# Patient Record
Sex: Female | Born: 1935 | Race: White | Hispanic: No | Smoking: Never smoker
Health system: Southern US, Community
[De-identification: ages and names within clinical notes are randomized; demographics above are authoritative.]

## PROBLEM LIST (undated history)

## (undated) DIAGNOSIS — I480 Paroxysmal atrial fibrillation: Secondary | ICD-10-CM

## (undated) DIAGNOSIS — R55 Syncope and collapse: Secondary | ICD-10-CM

## (undated) DIAGNOSIS — I2510 Atherosclerotic heart disease of native coronary artery without angina pectoris: Secondary | ICD-10-CM

## (undated) DIAGNOSIS — I5032 Chronic diastolic (congestive) heart failure: Secondary | ICD-10-CM

## (undated) DIAGNOSIS — N182 Chronic kidney disease, stage 2 (mild): Secondary | ICD-10-CM

## (undated) DIAGNOSIS — E669 Obesity, unspecified: Secondary | ICD-10-CM

## (undated) DIAGNOSIS — I10 Essential (primary) hypertension: Secondary | ICD-10-CM

## (undated) DIAGNOSIS — E119 Type 2 diabetes mellitus without complications: Secondary | ICD-10-CM

## (undated) DIAGNOSIS — M1990 Unspecified osteoarthritis, unspecified site: Secondary | ICD-10-CM

## (undated) DIAGNOSIS — R001 Bradycardia, unspecified: Secondary | ICD-10-CM

## (undated) HISTORY — PX: PACEMAKER INSERTION: SHX728

## (undated) HISTORY — DX: Type 2 diabetes mellitus without complications: E11.9

## (undated) HISTORY — PX: CATARACT EXTRACTION: SUR2

## (undated) HISTORY — PX: TOTAL ABDOMINAL HYSTERECTOMY: SHX209

## (undated) HISTORY — DX: Chronic kidney disease, stage 2 (mild): N18.2

## (undated) HISTORY — DX: Paroxysmal atrial fibrillation: I48.0

## (undated) HISTORY — DX: Essential (primary) hypertension: I10

## (undated) HISTORY — DX: Obesity, unspecified: E66.9

## (undated) HISTORY — DX: Unspecified osteoarthritis, unspecified site: M19.90

## (undated) HISTORY — DX: Atherosclerotic heart disease of native coronary artery without angina pectoris: I25.10

## (undated) HISTORY — DX: Chronic diastolic (congestive) heart failure: I50.32

---

## 2009-01-28 ENCOUNTER — Encounter: Payer: Self-pay | Admitting: Cardiovascular Disease

## 2009-01-30 ENCOUNTER — Ambulatory Visit: Payer: Self-pay | Admitting: Cardiology

## 2009-01-31 ENCOUNTER — Encounter: Payer: Self-pay | Admitting: Cardiology

## 2009-02-03 ENCOUNTER — Encounter: Payer: Self-pay | Admitting: Cardiology

## 2009-02-04 ENCOUNTER — Ambulatory Visit: Payer: Self-pay | Admitting: Cardiovascular Disease

## 2009-02-04 ENCOUNTER — Encounter: Payer: Self-pay | Admitting: Cardiology

## 2009-02-04 ENCOUNTER — Inpatient Hospital Stay (HOSPITAL_COMMUNITY): Admission: AD | Admit: 2009-02-04 | Discharge: 2009-02-07 | Payer: Self-pay | Admitting: Cardiology

## 2009-02-05 DIAGNOSIS — I2510 Atherosclerotic heart disease of native coronary artery without angina pectoris: Secondary | ICD-10-CM

## 2009-02-05 HISTORY — DX: Atherosclerotic heart disease of native coronary artery without angina pectoris: I25.10

## 2009-02-10 ENCOUNTER — Encounter: Payer: Self-pay | Admitting: Cardiology

## 2009-02-13 ENCOUNTER — Encounter: Payer: Self-pay | Admitting: Cardiology

## 2009-02-15 ENCOUNTER — Encounter: Payer: Self-pay | Admitting: Cardiovascular Disease

## 2009-02-15 ENCOUNTER — Encounter: Payer: Self-pay | Admitting: Cardiology

## 2009-02-15 ENCOUNTER — Ambulatory Visit: Payer: Self-pay | Admitting: Cardiology

## 2009-02-16 ENCOUNTER — Encounter: Payer: Self-pay | Admitting: Cardiology

## 2009-02-17 ENCOUNTER — Inpatient Hospital Stay (HOSPITAL_COMMUNITY): Admission: AD | Admit: 2009-02-17 | Discharge: 2009-02-19 | Payer: Self-pay | Admitting: Cardiology

## 2009-02-17 ENCOUNTER — Encounter: Payer: Self-pay | Admitting: Cardiology

## 2009-02-19 ENCOUNTER — Encounter: Payer: Self-pay | Admitting: Cardiovascular Disease

## 2009-02-19 DIAGNOSIS — I2589 Other forms of chronic ischemic heart disease: Secondary | ICD-10-CM | POA: Insufficient documentation

## 2009-02-19 DIAGNOSIS — M1990 Unspecified osteoarthritis, unspecified site: Secondary | ICD-10-CM | POA: Insufficient documentation

## 2009-02-19 DIAGNOSIS — I509 Heart failure, unspecified: Secondary | ICD-10-CM | POA: Insufficient documentation

## 2009-02-19 DIAGNOSIS — E669 Obesity, unspecified: Secondary | ICD-10-CM | POA: Insufficient documentation

## 2009-02-19 DIAGNOSIS — I5021 Acute systolic (congestive) heart failure: Secondary | ICD-10-CM | POA: Insufficient documentation

## 2009-02-19 DIAGNOSIS — E119 Type 2 diabetes mellitus without complications: Secondary | ICD-10-CM | POA: Insufficient documentation

## 2009-02-19 DIAGNOSIS — I214 Non-ST elevation (NSTEMI) myocardial infarction: Secondary | ICD-10-CM | POA: Insufficient documentation

## 2009-02-19 DIAGNOSIS — I10 Essential (primary) hypertension: Secondary | ICD-10-CM

## 2009-02-19 DIAGNOSIS — R079 Chest pain, unspecified: Secondary | ICD-10-CM

## 2009-03-10 ENCOUNTER — Encounter: Payer: Self-pay | Admitting: Cardiology

## 2009-03-12 ENCOUNTER — Ambulatory Visit: Payer: Self-pay | Admitting: Cardiology

## 2009-03-12 DIAGNOSIS — I498 Other specified cardiac arrhythmias: Secondary | ICD-10-CM

## 2009-03-12 DIAGNOSIS — N182 Chronic kidney disease, stage 2 (mild): Secondary | ICD-10-CM | POA: Insufficient documentation

## 2009-03-16 ENCOUNTER — Telehealth: Payer: Self-pay | Admitting: Cardiology

## 2009-03-19 ENCOUNTER — Telehealth (INDEPENDENT_AMBULATORY_CARE_PROVIDER_SITE_OTHER): Payer: Self-pay | Admitting: *Deleted

## 2009-03-24 ENCOUNTER — Encounter: Payer: Self-pay | Admitting: Cardiology

## 2009-06-05 ENCOUNTER — Encounter: Payer: Self-pay | Admitting: Cardiology

## 2009-06-06 ENCOUNTER — Ambulatory Visit: Payer: Self-pay | Admitting: Cardiology

## 2009-06-19 ENCOUNTER — Encounter: Payer: Self-pay | Admitting: Cardiology

## 2009-06-19 ENCOUNTER — Ambulatory Visit: Payer: Self-pay | Admitting: Cardiology

## 2009-07-17 ENCOUNTER — Encounter: Payer: Self-pay | Admitting: Cardiology

## 2009-09-01 ENCOUNTER — Encounter: Payer: Self-pay | Admitting: Cardiology

## 2009-09-01 DIAGNOSIS — R0602 Shortness of breath: Secondary | ICD-10-CM | POA: Insufficient documentation

## 2009-09-17 ENCOUNTER — Ambulatory Visit: Payer: Self-pay | Admitting: Cardiology

## 2009-09-17 ENCOUNTER — Encounter: Payer: Self-pay | Admitting: Cardiology

## 2009-09-25 ENCOUNTER — Encounter (INDEPENDENT_AMBULATORY_CARE_PROVIDER_SITE_OTHER): Payer: Self-pay | Admitting: *Deleted

## 2009-10-10 ENCOUNTER — Ambulatory Visit: Payer: Self-pay | Admitting: Cardiology

## 2009-10-10 ENCOUNTER — Encounter: Payer: Self-pay | Admitting: Cardiology

## 2010-04-22 ENCOUNTER — Ambulatory Visit: Payer: Self-pay | Admitting: Cardiology

## 2010-04-22 DIAGNOSIS — E876 Hypokalemia: Secondary | ICD-10-CM | POA: Insufficient documentation

## 2010-04-22 DIAGNOSIS — I2510 Atherosclerotic heart disease of native coronary artery without angina pectoris: Secondary | ICD-10-CM

## 2010-04-23 ENCOUNTER — Encounter: Payer: Self-pay | Admitting: Cardiology

## 2010-05-26 NOTE — Miscellaneous (Signed)
Summary: Orders Update - BMET,HgA1c,Calcium  Clinical Lists Changes  Orders: Added new Test order of T-Basic Metabolic Panel [id number]) - Signed Added new Test order of T- Hemoglobin A1C ([id number]) - Signed Added new Test order of T-Calcium  ([id number]) - Signed

## 2010-05-26 NOTE — Consult Note (Signed)
Summary: CARDIOLOGY CONSULT/ [facility]  CARDIOLOGY CONSULT/ [facility]   Imported By: Zachary George 10/10/2009 09:52:56  _____________________________________________________________________  External Attachment:    Type:   Image     Comment:   External Document

## 2010-05-26 NOTE — Letter (Signed)
Summary: Engineer, materials at [facility]  [address]   [place], [place] [address]   Phone: [phone]  Fax: [phone]        September 25, 2009 MRN: [id number]   Brianna Chandler [address] [place], [place]  [address]   Dear Brianna Chandler,  Your test ordered by Selena Batten has been reviewed by your physician (or physician assistant) and was found to be normal or stable. Your physician (or physician assistant) felt no changes were needed at this time.  __X__ Echocardiogram  ____ Cardiac Stress Test  ____ Lab Work  ____ Peripheral vascular study of arms, legs or neck  ____ CT scan or X-ray  ____ Lung or Breathing test  ____ Other:   Thank you.   Hoover Brunette, LPN    Duane Boston, M.D., F.A.C.C. Thressa Sheller, M.D., F.A.C.C. Oneal Grout, M.D., F.A.C.C. Cheree Ditto, M.D., F.A.C.C. Daiva Nakayama, M.D., F.A.C.C. Kenney Houseman, M.D., F.A.C.C. Jeanne Ivan, PA-C

## 2010-05-26 NOTE — Letter (Signed)
Summary: [facility] D/C DR. Wende Crease  [facility] D/C DR. Wende Crease   Imported By: Zachary George 10/10/2009 09:53:21  _____________________________________________________________________  External Attachment:    Type:   Image     Comment:   External Document

## 2010-05-26 NOTE — Assessment & Plan Note (Signed)
Summary: 3 MONTH FU   Visit Type:  Follow-up Primary Provider:  Margo Common   History of Present Illness: the patient is an 77 year old female with an ischemic cardiomyopathy.  Prior ejection fraction was 30%.  She had a recent echocardiogram her ejection fraction has improved to 55 to 60%.  She has a stent placed to the LAD.  His been doing quite well.  She reports occasional chest pains that appear to be atypical.  She also does not use nitroglycerin.  She stated that her legs hurt quite a bit.  She denies however any claudication she reports more a sensation of stiffness.  She denies orthopnea PND  Preventive Screening-Counseling & Management  Alcohol-Tobacco     Smoking Status: never  Current Medications (verified): 1)  Tylenol 325 Mg Tabs (Acetaminophen) .... Take One By Mouth Every 4 Hours As Needed 2)  Aspirin 325 Mg Tabs (Aspirin) .... Take 1 Tablet By Mouth Once A Day 3)  Plavix 75 Mg Tabs (Clopidogrel Bisulfate) .... Take 1 Tablet By Mouth Once A Day 4)  Furosemide 20 Mg Tabs (Furosemide) .... Take 1 Tablet By Mouth Once A Day 5)  Isosorbide Mononitrate Cr 60 Mg Xr24h-Tab (Isosorbide Mononitrate) .... Take 1&1/2 Tablet By Mouth Once A Day 6)  Potassium Chloride Crys Cr 10 Meq Cr-Tabs (Potassium Chloride Crys Cr) .... Take 1 Tablet By Mouth Once A Day 7)  Lisinopril 20 Mg Tabs (Lisinopril) .... Take 1 Tablet By Mouth Once A Day 8)  Glucosamine Sulfate 750 Mg Tabs (Glucosamine Sulfate) .... Take 1 Tablet By Mouth Two Times A Day 9)  Meclizine Hcl 25 Mg Tabs (Meclizine Hcl) .... Take 1-2  Tablet By Mouth Three Times A Day As Needed 10)  Metformin Hcl 500 Mg Xr24h-Tab (Metformin Hcl) .... Take 2 Tablet By Mouth Once A Day 11)  Tramadol Hcl 50 Mg Tabs (Tramadol Hcl) .... Take One By Mouth Every Four Hours As Needed 12)  One Daily Complete  Tabs (Multiple Vitamins-Minerals) .... Take 1 Tablet By Mouth Once A Day 13)  Coreg 6.25 Mg Tabs (Carvedilol) .... Take 1 Tablet By Mouth Two Times A  Day (Place On File) 14)  Nitrostat 0.4 Mg Subl (Nitroglycerin) .Marland Kitchen.. 1 Tablet Under Tongue At Onset of Chest Pain; You May Repeat Every 5 Minutes For Up To 3 Doses. 15)  Losartan Potassium 50 Mg Tabs (Losartan Potassium) .... Take 1 Tablet By Mouth Once A Day 16)  Spironolactone 25 Mg Tabs (Spironolactone) .... Take 1 Tablet By Mouth Two Times A Day  Allergies (verified): No Known Drug Allergies  Comments:  Nurse/Medical Assistant: The patient's medication bottles and allergies were reviewed with the patient and were updated in the Medication and Allergy Lists.  Past History:  Past Medical History: Last updated: 02/19/2009 CHEST PAIN (ICD-786.50) ACUT MI SUBENDOCARDIAL INFARCT INIT EPIS CARE (ICD-410.71) ACUTE SYSTOLIC HEART FAILURE (ICD-428.21) CHF (ICD-428.0) OTHER SPEC FORMS CHRONIC ISCHEMIC HEART DISEASE (ICD-414.8) DM (ICD-250.00) HYPERTENSION (ICD-401.9) OSTEOARTHROS UNSPEC WHETHER GEN/LOC UNSPEC SITE (ICD-715.90) OBESITY (ICD-278.00)  Past Surgical History: Last updated: 02/19/2009 Abdominal Hysterectomy-Total Cataract Extraction  Past Cardiac History:  [facility] Hospitalizations: FINAL DIAGNOSES: 1. Admitted with weakness secondary to significant bradycardia.     a.     Heart rate in the 40s.     b.     Labetalol 200 mg b.i.d. STOPPED.     c.     The patient's heart rate has been 70 beats per minute or      above for the last 48  hours here at [facility].     d.     Coreg 3.125 mg b.i.d. started February 17, 2009 in the      evening. 2. Elevated BUN with a creatinine of 1.53, a BUN of 62, both     lisinopril and metformin have been placed on hold.  Her discharge     basic metabolic panel shows a creatinine of 1.28.  The metformin     has been restarted and the lisinopril was continued on hold and the     patient has been asked to hold it until she sees Dr. Andee Lineman. 3. Recent admission for NSTEMI/with acute-on-chronic congestive heart     failure, February 04, 2009.     a.     Echocardiogram that admission, ejection fraction 20-25%.     b.     Left heart catheterization February 05, 2009 ejection      fraction 30%, a 90% proximal left anterior descending (coronary      artery) stenosis was intervened with a Xience drug-eluting stent.   SECONDARY DIAGNOSES: 1. New York Heart Association class IIB chronic systolic congestive     heart failure. 2. Diabetes. 3. Hypertension. 4. Osteoarthritis.   Once again symptomatic bradycardia has resolved with discontinuing the patient's labetalol 200 mg twice daily dose. 02/15/2009 - 03/22/2009:  Unstable Angina (bradycardia)   Review of Systems       The patient complains of chest pain.  The patient denies fatigue, malaise, fever, weight gain/loss, vision loss, decreased hearing, hoarseness, palpitations, shortness of breath, prolonged cough, wheezing, sleep apnea, coughing up blood, abdominal pain, blood in stool, nausea, vomiting, diarrhea, heartburn, incontinence, blood in urine, muscle weakness, joint pain, leg swelling, rash, skin lesions, headache, fainting, dizziness, depression, anxiety, enlarged lymph nodes, easy bruising or bleeding, and environmental allergies.    Vital Signs:  Patient profile:   77 year old female Height:      63 inches Weight:      217 pounds Pulse rate:   67 / minute BP sitting:   142 / 70  (left arm) Cuff size:   large  Vitals Entered By: Carlye Grippe (October 10, 2009 10:50 AM)  Physical Exam  Additional Exam:  General: Well-developed, well-nourished in no distress head: Normocephalic and atraumatic eyes PERRLA/EOMI intact, conjunctiva and lids normal nose: No deformity or lesions mouth normal dentition, normal posterior pharynx neck: Supple, no JVD.  No masses, thyromegaly or abnormal cervical nodes lungs: Normal breath sounds bilaterally without wheezing.  Normal percussion heart: regular rate and rhythm with normal S1 and S2, no S3 or S4.  PMI is normal.  No  pathological murmurs abdomen: Normal bowel sounds, abdomen is soft and nontender without masses, organomegaly or hernias noted.  No hepatosplenomegaly musculoskeletal: Back normal, normal gait muscle strength and tone normal pulsus: Pulse is normal in all 4 extremities Extremities: No peripheral pitting edema neurologic: Alert and oriented x 3 skin: Intact without lesions or rashes cervical nodes: No significant adenopathy psychologic: Normal affect    Impression & Recommendations:  Problem # 1:  CHEST PAIN (ICD-786.50) the patient's chest pain is atypical.  I do not think she needs an ischemia workup particularly in light of her improved ejection fraction.  She can use p.r.n. nitroglycerin. Her updated medication list for this problem includes:    Aspirin 325 Mg Tabs (Aspirin) .Marland Kitchen... Take 1 tablet by mouth once a day    Plavix 75 Mg Tabs (Clopidogrel bisulfate) .Marland Kitchen... Take 1 tablet by  mouth once a day    Isosorbide Mononitrate Cr 60 Mg Xr24h-tab (Isosorbide mononitrate) .Marland Kitchen... Take 1&1/2 tablet by mouth once a day    Lisinopril 20 Mg Tabs (Lisinopril) .Marland Kitchen... Take 1 tablet by mouth once a day    Coreg 6.25 Mg Tabs (Carvedilol) .Marland Kitchen... Take 1 tablet by mouth two times a day (place on file)    Nitrostat 0.4 Mg Subl (Nitroglycerin) .Marland Kitchen... 1 tablet under tongue at onset of chest pain; you may repeat every 5 minutes for up to 3 doses.  Problem # 2:  BRADYCARDIA (ICD-427.89) bradycardia appears to have resolved.  The patient's heart rate was in normal limits. Her updated medication list for this problem includes:    Aspirin 325 Mg Tabs (Aspirin) .Marland Kitchen... Take 1 tablet by mouth once a day    Plavix 75 Mg Tabs (Clopidogrel bisulfate) .Marland Kitchen... Take 1 tablet by mouth once a day    Isosorbide Mononitrate Cr 60 Mg Xr24h-tab (Isosorbide mononitrate) .Marland Kitchen... Take 1&1/2 tablet by mouth once a day    Lisinopril 20 Mg Tabs (Lisinopril) .Marland Kitchen... Take 1 tablet by mouth once a day    Coreg 6.25 Mg Tabs (Carvedilol) .Marland Kitchen...  Take 1 tablet by mouth two times a day (place on file)    Nitrostat 0.4 Mg Subl (Nitroglycerin) .Marland Kitchen... 1 tablet under tongue at onset of chest pain; you may repeat every 5 minutes for up to 3 doses.  Problem # 3:  CHF (ICD-428.0) the patient has had no symptoms of heart failure.  Previously she had LV dysfunction but this has resolved. Her updated medication list for this problem includes:    Aspirin 325 Mg Tabs (Aspirin) .Marland Kitchen... Take 1 tablet by mouth once a day    Plavix 75 Mg Tabs (Clopidogrel bisulfate) .Marland Kitchen... Take 1 tablet by mouth once a day    Furosemide 20 Mg Tabs (Furosemide) .Marland Kitchen... Take 1 tablet by mouth once a day    Isosorbide Mononitrate Cr 60 Mg Xr24h-tab (Isosorbide mononitrate) .Marland Kitchen... Take 1&1/2 tablet by mouth once a day    Lisinopril 20 Mg Tabs (Lisinopril) .Marland Kitchen... Take 1 tablet by mouth once a day    Coreg 6.25 Mg Tabs (Carvedilol) .Marland Kitchen... Take 1 tablet by mouth two times a day (place on file)    Nitrostat 0.4 Mg Subl (Nitroglycerin) .Marland Kitchen... 1 tablet under tongue at onset of chest pain; you may repeat every 5 minutes for up to 3 doses.    Losartan Potassium 50 Mg Tabs (Losartan potassium) .Marland Kitchen... Take 1 tablet by mouth once a day    Spironolactone 25 Mg Tabs (Spironolactone) .Marland Kitchen... Take 1 tablet by mouth two times a day  Patient Instructions: 1)  Your physician wants you to follow-up in: 6 months. You will receive a reminder letter in the mail one-two months in advance. If you don't receive a letter, please call our office to schedule the follow-up appointment. 2)  Your physician recommends that you continue on your current medications as directed. Please refer to the Current Medication list given to you today. Prescriptions: NITROSTAT 0.4 MG SUBL (NITROGLYCERIN) 1 tablet under tongue at onset of chest pain; you may repeat every 5 minutes for up to 3 doses.  #25 x 3   Entered by:   Cyril Loosen, RN, BSN   Authorized by:   Lewayne Bunting, MD, [facility]   Signed by:   Cyril Loosen, RN, BSN on  10/10/2009   Method used:   Electronically to        [facility]* (  retail)       [address]       [place], [place]  [address]       Ph: [phone]       Fax: [phone]   RxID:   [id number]

## 2010-05-26 NOTE — Assessment & Plan Note (Signed)
Summary: f11m --agh   Visit Type:  Follow-up Primary Provider:  Margo Common  CC:  follow-up visit.  History of Present Illness: the patient is an 77 year old female with a history of ischemic cardiomyopathy.  Her ejection fraction is approximately 30%.  Status-post stent placement to the LAD.  She expressed an episode of chest pain last week.  It was relieved with nitroglycerin.  She has occasional frontal breath upon walking.  She denies any orthopnea PND.  She denies any chest pain on exertion.  Denies any palpitations or syncope.  Clinical Review Panels:  CXR CXR results  Clinical Data: Shortness of breath.                   PORTABLE CHEST - 1 VIEW                             Comparison: Chest 01/30/2009.                   Findings: There is cardiomegaly a small left pleural effusion.         Pulmonary edema seen on the prior study has cleared. Mild bibasilar         atelectasis noted.                   IMPRESSION:         Cardiomegaly and small left pleural effusion.  No pulmonary edema.                     (01/30/2009)  Echocardiogram Echocardiogram Transthoracic Echocardiogram               Conclusions:         1. The estimated ejection fraction is 25-30%.             2. There is severely decreased left ventricular systolic function.         3. There are multiple regional wall motion abnormalities.          4. Mild concentric left ventricular hypertrophy is observed.         5. The right ventricular global systolic function is mildly reduced.         6. The left atrium is mildly dilated.         7. The right atrium is mildly dilated.          8. There is a trace of aortic regurgitation.         9. There is mild to moderate mitral regurgitation.          10. There is moderate tricuspid regurgitation.         11. There is evidence of moderate pulmonary hypertension.         12. A trivial pericardial effusion is visualized.                             Electronically  signed at 01/31/2009 09:58:06 by: Lewayne Bunting, M.D. (01/31/2009)  Cardiac Imaging Cardiac Cath Findings                            CARDIAC CATHETERIZATION    FINAL ASSESSMENT:   1. Severe proximal left anterior descending stenosis with successful       percutaneous intervention using a single drug-eluting stent.   2. Severe obtuse marginal  stenosis involving the small obtuse marginal-       1 branch.   3. Nonobstructive right coronary artery stenosis.   4. Severe segmental left ventricular systolic dysfunction.      PLAN:  Recommend aspirin and Plavix for a minimum of 12 months.  We   would continue medical therapy for the patient's residual CAD and   medical therapy for the patient's congestive heart failure.   Consideration could be made to reassess her LV function in 2-3 months   following her PCI procedure.           Veverly Fells. Excell Seltzer, MD     (02/05/2009)    Preventive Screening-Counseling & Management  Alcohol-Tobacco     Smoking Status: never  Current Medications (verified): 1)  Tylenol 325 Mg Tabs (Acetaminophen) .... Take One By Mouth Every 4 Hours As Needed 2)  Aspirin 325 Mg Tabs (Aspirin) .... Take 1 Tablet By Mouth Once A Day 3)  Plavix 75 Mg Tabs (Clopidogrel Bisulfate) .... Take 1 Tablet By Mouth Once A Day 4)  Furosemide 40 Mg Tabs (Furosemide) .... Take 1 Tablet By Mouth Two Times A Day 5)  Isosorbide Mononitrate Cr 60 Mg Xr24h-Tab (Isosorbide Mononitrate) .... Take 1 Tablet By Mouth Once A Day 6)  Potassium Chloride Crys Cr 10 Meq Cr-Tabs (Potassium Chloride Crys Cr) .... Take 1 Tablet By Mouth Once A Day 7)  Lisinopril 40 Mg Tabs (Lisinopril) .... Take 1/2 Tab Daily (20mg ) 8)  Glucosamine Sulfate 750 Mg Tabs (Glucosamine Sulfate) .... Take 1 Tablet By Mouth Two Times A Day 9)  Meclizine Hcl 25 Mg Tabs (Meclizine Hcl) .... Take 1-2  Tablet By Mouth Three Times A Day As Needed 10)  Metformin Hcl 500 Mg Xr24h-Tab (Metformin Hcl) .... Take 2 Tablet By Mouth Once  A Day 11)  Tramadol Hcl 50 Mg Tabs (Tramadol Hcl) .... Take One By Mouth Every Four Hours As Needed 12)  One Daily Complete  Tabs (Multiple Vitamins-Minerals) .... Take 1 Tablet By Mouth Once A Day 13)  Coreg 6.25 Mg Tabs (Carvedilol) .... Take 1 Tablet By Mouth Two Times A Day (Place On File) 14)  Nitrostat 0.4 Mg Subl (Nitroglycerin) .Marland Kitchen.. 1 Tablet Under Tongue At Onset of Chest Pain; You May Repeat Every 5 Minutes For Up To 3 Doses.  Allergies (verified): No Known Drug Allergies  Comments:  Nurse/Medical Assistant: The patient's medications and allergies were reviewed with the patient and were updated in the Medication and Allergy Lists. Bottles reviewed.  Past History:  Past Medical History: Last updated: 02/19/2009 CHEST PAIN (ICD-786.50) ACUT MI SUBENDOCARDIAL INFARCT INIT EPIS CARE (ICD-410.71) ACUTE SYSTOLIC HEART FAILURE (ICD-428.21) CHF (ICD-428.0) OTHER SPEC FORMS CHRONIC ISCHEMIC HEART DISEASE (ICD-414.8) DM (ICD-250.00) HYPERTENSION (ICD-401.9) OSTEOARTHROS UNSPEC WHETHER GEN/LOC UNSPEC SITE (ICD-715.90) OBESITY (ICD-278.00)  Past Surgical History: Last updated: 02/19/2009 Abdominal Hysterectomy-Total Cataract Extraction  Family History: Last updated: 02/19/2009 Noncontributory  Social History: Last updated: 02/19/2009 Retired  Married  Alcohol Use - no Drug Use - no  Risk Factors: Smoking Status: never (06/06/2009)  Past Cardiac History:  [facility] Hospitalizations: FINAL DIAGNOSES: 1. Admitted with weakness secondary to significant bradycardia.     a.     Heart rate in the 40s.     b.     Labetalol 200 mg b.i.d. STOPPED.     c.     The patient's heart rate has been 70 beats per minute or      above for the last 48 hours here at  [facility].     d.     Coreg 3.125 mg b.i.d. started February 17, 2009 in the      evening. 2. Elevated BUN with a creatinine of 1.53, a BUN of 62, both     lisinopril and metformin have been placed on hold.  Her  discharge     basic metabolic panel shows a creatinine of 1.28.  The metformin     has been restarted and the lisinopril was continued on hold and the     patient has been asked to hold it until she sees Dr. Andee Lineman. 3. Recent admission for NSTEMI/with acute-on-chronic congestive heart     failure, February 04, 2009.     a.     Echocardiogram that admission, ejection fraction 20-25%.     b.     Left heart catheterization February 05, 2009 ejection      fraction 30%, a 90% proximal left anterior descending (coronary      artery) stenosis was intervened with a Xience drug-eluting stent.   SECONDARY DIAGNOSES: 1. New York Heart Association class IIB chronic systolic congestive     heart failure. 2. Diabetes. 3. Hypertension. 4. Osteoarthritis.   Once again symptomatic bradycardia has resolved with discontinuing the patient's labetalol 200 mg twice daily dose. 02/15/2009 - 03/22/2009:  Unstable Angina (bradycardia)  Review of Systems       The patient complains of chest pain.  The patient denies fatigue, malaise, fever, weight gain/loss, vision loss, decreased hearing, hoarseness, palpitations, shortness of breath, prolonged cough, wheezing, sleep apnea, coughing up blood, abdominal pain, blood in stool, nausea, vomiting, diarrhea, heartburn, incontinence, blood in urine, muscle weakness, joint pain, leg swelling, rash, skin lesions, headache, fainting, dizziness, depression, anxiety, enlarged lymph nodes, easy bruising or bleeding, and environmental allergies.    Vital Signs:  Patient profile:   77 year old female Height:      63 inches Weight:      204 pounds O2 Sat:      97 % Pulse rate:   75 / minute BP sitting:   134 / 82  (left arm) Cuff size:   large  Vitals Entered By: Carlye Grippe (June 06, 2009 9:27 AM) CC: follow-up visit   Physical Exam  Additional Exam:  General: Well-developed, well-nourished in no distress head: Normocephalic and atraumatic eyes PERRLA/EOMI  intact, conjunctiva and lids normal nose: No deformity or lesions mouth normal dentition, normal posterior pharynx neck: Supple, no JVD.  No masses, thyromegaly or abnormal cervical nodes lungs: Normal breath sounds bilaterally without wheezing.  Normal percussion heart: regular rate and rhythm with normal S1 and S2, no S3 or S4.  PMI is normal.  No pathological murmurs abdomen: Normal bowel sounds, abdomen is soft and nontender without masses, organomegaly or hernias noted.  No hepatosplenomegaly musculoskeletal: Back normal, normal gait muscle strength and tone normal pulsus: Pulse is normal in all 4 extremities Extremities: No peripheral pitting edema neurologic: Alert and oriented x 3 skin: Intact without lesions or rashes cervical nodes: No significant adenopathy psychologic: Normal affect    EKG  Procedure date:  06/06/2009  Findings:      normal sinus rhythm heart rate 71 beats/min.  Significant baseline artifact but no definite ischemia  Impression & Recommendations:  Problem # 1:  ACUTE SYSTOLIC HEART FAILURE (ICD-428.21) the patient is hemodynamically well compensated.  Will continue her current medical regimen.  I did increase Corag 6.25 once p.o. b.i.d.  We will also reassess her ejection  fraction in 3 months with an echocardiogram Her updated medication list for this problem includes:    Aspirin 325 Mg Tabs (Aspirin) .Marland Kitchen... Take 1 tablet by mouth once a day    Plavix 75 Mg Tabs (Clopidogrel bisulfate) .Marland Kitchen... Take 1 tablet by mouth once a day    Furosemide 40 Mg Tabs (Furosemide) .Marland Kitchen... Take 1 tablet by mouth two times a day    Isosorbide Mononitrate Cr 60 Mg Xr24h-tab (Isosorbide mononitrate) .Marland Kitchen... Take 1 tablet by mouth once a day    Lisinopril 40 Mg Tabs (Lisinopril) .Marland Kitchen... Take 1/2 tab daily (20mg )    Coreg 6.25 Mg Tabs (Carvedilol) .Marland Kitchen... Take 1 tablet by mouth two times a day (place on file)    Nitrostat 0.4 Mg Subl (Nitroglycerin) .Marland Kitchen... 1 tablet under tongue at onset  of chest pain; you may repeat every 5 minutes for up to 3 doses.  Problem # 2:  BRADYCARDIA (ICD-427.89) stable EKG chemistries normal sinus rhythm the patient reports no presyncope or syncope. Her updated medication list for this problem includes:    Aspirin 325 Mg Tabs (Aspirin) .Marland Kitchen... Take 1 tablet by mouth once a day    Plavix 75 Mg Tabs (Clopidogrel bisulfate) .Marland Kitchen... Take 1 tablet by mouth once a day    Isosorbide Mononitrate Cr 60 Mg Xr24h-tab (Isosorbide mononitrate) .Marland Kitchen... Take 1 tablet by mouth once a day    Lisinopril 40 Mg Tabs (Lisinopril) .Marland Kitchen... Take 1/2 tab daily (20mg )    Coreg 6.25 Mg Tabs (Carvedilol) .Marland Kitchen... Take 1 tablet by mouth two times a day (place on file)    Nitrostat 0.4 Mg Subl (Nitroglycerin) .Marland Kitchen... 1 tablet under tongue at onset of chest pain; you may repeat every 5 minutes for up to 3 doses.  Problem # 3:  RENAL INSUFFICIENCY (ICD-588.9) with recent creatinine was .98.  Other Orders: EKG w/ Interpretation (93000)  Patient Instructions: 1)  Echo in 3 months (just before next visit) 2)  Increase Coreg 6.25mg  two times a day  3)  Follow up in  3 months.   Prescriptions: COREG 6.25 MG TABS (CARVEDILOL) Take 1 tablet by mouth two times a day (place on file)  #60 x 6   Entered by:   Hoover Brunette, LPN   Authorized by:   Lewayne Bunting, MD, [facility]   Signed by:   Hoover Brunette, LPN on 82/95/6213   Method used:   Electronically to        [facility]* (retail)       [address]       [place], [place]  [address]       Ph: [phone]       Fax: [phone]   RxID:   [id number]

## 2010-05-26 NOTE — Miscellaneous (Signed)
Summary: Orders Update  Clinical Lists Changes  Problems: Added new problem of SHORTNESS OF BREATH (ICD-786.05) Orders: Added new Referral order of 2-D Echocardiogram (2D Echo) - Signed 

## 2010-05-28 NOTE — Assessment & Plan Note (Signed)
Summary: 6 MONTH FU RECV REMINDER VS   Visit Type:  Follow-up Primary Charlottie Peragine:  Margo Common   History of Present Illness: the patient is an 77 year old female with an ischemic cardiomyopathy.  However her ejection fraction has normalized.  She had a stent placed in October of 2000 and then to the LAD.  She also has hypertension.  She denies any chest pain for some breath orthopnea PND.  She has a history of chronic renal insufficiency.  She reports some swelling in her feet but otherwise is doing relatively well.  Apparently the patient is both on potassium and spironolactone.  She denies any palpitations presyncope or syncope.  Preventive Screening-Counseling & Management  Alcohol-Tobacco     Smoking Status: never  Current Medications (verified): 1)  Tylenol 325 Mg Tabs (Acetaminophen) .... Take One By Mouth Every 4 Hours As Needed 2)  Aspirin 325 Mg Tabs (Aspirin) .... Take 1 Tablet By Mouth Once A Day 3)  Plavix 75 Mg Tabs (Clopidogrel Bisulfate) .... Take 1 Tablet By Mouth Once A Day 4)  Furosemide 20 Mg Tabs (Furosemide) .... Take 1 Tablet By Mouth Once A Day 5)  Isosorbide Mononitrate Cr 60 Mg Xr24h-Tab (Isosorbide Mononitrate) .... Take 1&1/2 Tablet By Mouth Once A Day 6)  Potassium Chloride Crys Cr 10 Meq Cr-Tabs (Potassium Chloride Crys Cr) .... Take 1 Tablet By Mouth Once A Day 7)  Lisinopril 20 Mg Tabs (Lisinopril) .... Take 1 Tablet By Mouth Once A Day 8)  Glucosamine Sulfate 750 Mg Tabs (Glucosamine Sulfate) .... Take 1 Tablet By Mouth Two Times A Day 9)  Meclizine Hcl 25 Mg Tabs (Meclizine Hcl) .... Take 1-2  Tablet By Mouth Three Times A Day As Needed 10)  Metformin Hcl 500 Mg Xr24h-Tab (Metformin Hcl) .... Take 2 Tablet By Mouth Once A Day 11)  Tramadol Hcl 50 Mg Tabs (Tramadol Hcl) .... Take One By Mouth Every Four Hours As Needed 12)  One Daily Complete  Tabs (Multiple Vitamins-Minerals) .... Take 1 Tablet By Mouth Once A Day 13)  Coreg 6.25 Mg Tabs (Carvedilol) ....  Take 1 Tablet By Mouth Two Times A Day (Place On File) 14)  Nitrostat 0.4 Mg Subl (Nitroglycerin) .Marland Kitchen.. 1 Tablet Under Tongue At Onset of Chest Pain; You May Repeat Every 5 Minutes For Up To 3 Doses. 15)  Losartan Potassium 50 Mg Tabs (Losartan Potassium) .... Take 1 Tablet By Mouth Once A Day 16)  Spironolactone 25 Mg Tabs (Spironolactone) .... Take 1 Tablet By Mouth Two Times A Day  Allergies (verified): No Known Drug Allergies  Comments:  Nurse/Medical Assistant: The patient's medication list and allergies were reviewed with the patient and were updated in the Medication and Allergy Lists.  Past History:  Past Surgical History: Last updated: 02/19/2009 Abdominal Hysterectomy-Total Cataract Extraction  Family History: Last updated: 04/22/2010 Negative FH of Diabetes, Hypertension, or Coronary Artery Disease  Social History: Last updated: 02/19/2009 Retired  Married  Alcohol Use - no Drug Use - no  Risk Factors: Smoking Status: never (04/22/2010)  Past Medical History: CHEST PAIN (ICD-786.50) ACUT MI SUBENDOCARDIAL INFARCT INIT EPIS CARE (ICD-410.71) ACUTE SYSTOLIC HEART FAILURE (ICD-428.21) CHF (ICD-428.0) OTHER SPEC FORMS CHRONIC ISCHEMIC HEART DISEASE (ICD-414.8) DM (ICD-250.00) HYPERTENSION (ICD-401.9) OSTEOARTHROS UNSPEC WHETHER GEN/LOC UNSPEC SITE (ICD-715.90) OBESITY (ICD-278.00) history of bradycardia. History of renal insufficiency non-ST elevation myocardial infarction February 05, 2009, status post drug-eluting stent for a 90% proximal LAD lesion. Normal LV functionafter stent placement  Past Cardiac History:  [facility] Hospitalizations: FINAL DIAGNOSES:  1. Admitted with weakness secondary to significant bradycardia.     a.     Heart rate in the 40s.     b.     Labetalol 200 mg b.i.d. STOPPED.     c.     The patient's heart rate has been 70 beats per minute or      above for the last 48 hours here at [facility].     d.     Coreg 3.125 mg b.i.d.  started February 17, 2009 in the      evening. 2. Elevated BUN with a creatinine of 1.53, a BUN of 62, both     lisinopril and metformin have been placed on hold.  Her discharge     basic metabolic panel shows a creatinine of 1.28.  The metformin     has been restarted and the lisinopril was continued on hold and the     patient has been asked to hold it until she sees Dr. Andee Lineman. 3. Recent admission for NSTEMI/with acute-on-chronic congestive heart     failure, February 04, 2009.     a.     Echocardiogram that admission, ejection fraction 20-25%.     b.     Left heart catheterization February 05, 2009 ejection      fraction 30%, a 90% proximal left anterior descending (coronary      artery) stenosis was intervened with a Xience drug-eluting stent.   SECONDARY DIAGNOSES: 1. New York Heart Association class IIB chronic systolic congestive     heart failure. 2. Diabetes. 3. Hypertension. 4. Osteoarthritis.   Once again symptomatic bradycardia has resolved with discontinuing the patient's labetalol 200 mg twice daily dose. 02/15/2009 - 03/22/2009:  Unstable Angina (bradycardia)  Family History: Reviewed history from 02/19/2009 and no changes required. Negative FH of Diabetes, Hypertension, or Coronary Artery Disease  Review of Systems       The patient complains of leg swelling.  The patient denies fatigue, malaise, fever, weight gain/loss, vision loss, decreased hearing, hoarseness, chest pain, palpitations, shortness of breath, prolonged cough, wheezing, sleep apnea, coughing up blood, abdominal pain, blood in stool, nausea, vomiting, diarrhea, heartburn, incontinence, blood in urine, muscle weakness, joint pain, rash, skin lesions, headache, fainting, dizziness, depression, anxiety, enlarged lymph nodes, easy bruising or bleeding, and environmental allergies.    Vital Signs:  Patient profile:   77 year old female Height:      63 inches Weight:      221 pounds BMI:     39.29 Pulse  rate:   61 / minute BP sitting:   182 / 82  (left arm) Cuff size:   regular  Vitals Entered By: Carlye Grippe (April 22, 2010 12:59 PM)  Physical Exam  Additional Exam:  General: Well-developed, well-nourished in no distress head: Normocephalic and atraumatic eyes PERRLA/EOMI intact, conjunctiva and lids normal nose: No deformity or lesions mouth normal dentition, normal posterior pharynx neck: Supple, no JVD.  No masses, thyromegaly or abnormal cervical nodes lungs: Normal breath sounds bilaterally without wheezing.  Normal percussion heart: regular rate and rhythm with normal S1 and S2, no S3 or S4.  PMI is normal.  No pathological murmurs abdomen: Normal bowel sounds, abdomen is soft and nontender without masses, organomegaly or hernias noted.  No hepatosplenomegaly musculoskeletal: Back normal, normal gait muscle strength and tone normal pulsus: Pulse is normal in all 4 extremities Extremities: No peripheral pitting edema neurologic: Alert and oriented x 3 skin: Intact without lesions or rashes  cervical nodes: No significant adenopathy psychologic: Normal affect    Impression & Recommendations:  Problem # 1:  HYPOKALEMIA (ICD-276.8) will check an electrolyte panel.  Make sure the patient does not have hyperkalemia using potassium supplements and spironolactone together.  She also has history of renal insufficiency Orders: T-Comprehensive Metabolic Panel [id number]) T-CBC No Diff ([id number]) T-Lipid Profile [id number])  Problem # 2:  CORONARY ATHEROSCLEROSIS NATIVE CORONARY ARTERY (ICD-414.01) history of non-ST elevation myocardial infarction October 2010 now withbnormalized ejection fraction.  No recurrent chest pain. Her updated medication list for this problem includes:    Aspirin 325 Mg Tabs (Aspirin) .Marland Kitchen... Take 1 tablet by mouth once a day    Plavix 75 Mg Tabs (Clopidogrel bisulfate) .Marland Kitchen... Take 1 tablet by mouth once a day    Isosorbide Mononitrate Cr 60 Mg  Xr24h-tab (Isosorbide mononitrate) .Marland Kitchen... Take 1&1/2 tablet by mouth once a day    Lisinopril 20 Mg Tabs (Lisinopril) .Marland Kitchen... Take 1 tablet by mouth once a day    Coreg 6.25 Mg Tabs (Carvedilol) .Marland Kitchen... Take 1 tablet by mouth two times a day (place on file)    Nitrostat 0.4 Mg Subl (Nitroglycerin) .Marland Kitchen... 1 tablet under tongue at onset of chest pain; you may repeat every 5 minutes for up to 3 doses.  Orders: T-Comprehensive Metabolic Panel [id number]) T-CBC No Diff ([id number]) T-Lipid Profile ([id number])  Problem # 3:  BRADYCARDIA (ICD-427.89)  stable Her updated medication list for this problem includes:    Aspirin 325 Mg Tabs (Aspirin) .Marland Kitchen... Take 1 tablet by mouth once a day    Plavix 75 Mg Tabs (Clopidogrel bisulfate) .Marland Kitchen... Take 1 tablet by mouth once a day    Isosorbide Mononitrate Cr 60 Mg Xr24h-tab (Isosorbide mononitrate) .Marland Kitchen... Take 1&1/2 tablet by mouth once a day    Lisinopril 20 Mg Tabs (Lisinopril) .Marland Kitchen... Take 1 tablet by mouth once a day    Coreg 6.25 Mg Tabs (Carvedilol) .Marland Kitchen... Take 1 tablet by mouth two times a day (place on file)    Nitrostat 0.4 Mg Subl (Nitroglycerin) .Marland Kitchen... 1 tablet under tongue at onset of chest pain; you may repeat every 5 minutes for up to 3 doses.  Problem # 4:  RENAL INSUFFICIENCY (ICD-588.9) follow-up electrolyte panel.  Patient Instructions: 1)  Labs:  CMET, CBC, FLP 2)  Follow up in  6 months

## 2010-06-29 ENCOUNTER — Encounter: Payer: Self-pay | Admitting: Cardiology

## 2010-06-29 ENCOUNTER — Encounter (INDEPENDENT_AMBULATORY_CARE_PROVIDER_SITE_OTHER): Payer: Self-pay | Admitting: *Deleted

## 2010-07-07 NOTE — Miscellaneous (Signed)
Summary: Orders Update  Clinical Lists Changes  Problems: Added new problem of LONG-TERM (CURRENT) USE OF OTHER MEDICATIONS (ICD-V58.69) Orders: Added new Test order of T-Lipid Profile ([id number]) - Signed Added new Test order of T-Hepatic Function ([id number]) - Signed 

## 2010-07-07 NOTE — Letter (Signed)
Summary: Generic Engineer, agricultural at [facility] [address]   [place], [place] [address]   Phone: [phone]  Fax: [phone]        June 29, 2010 MRN: [id number]    Brianna Chandler [address] [place], [place]  [address]    Dear Ms. Lofquist,  According to our records, it is now time for your follow up lab work.  Please take the enclosed order to the [facility] at your earliest convenience.   Reminder:  Nothing to eat or drink after 12 midnight prior to labs.         Sincerely,  Hoover Brunette, LPN  This letter has been electronically signed by your physician.

## 2010-07-09 ENCOUNTER — Encounter: Payer: Self-pay | Admitting: Cardiology

## 2010-07-13 ENCOUNTER — Encounter: Payer: Self-pay | Admitting: *Deleted

## 2010-07-23 NOTE — Letter (Signed)
Summary: Engineer, materials at [facility]  [address]   [place], [place] [address]   Phone: [phone]  Fax: [phone]        July 13, 2010 MRN: [id number]   Brianna Chandler [address] [place], [place]  [address]   Dear [name],  Your test ordered by Selena Batten has been reviewed by your physician (or physician assistant) and was found to be normal or stable. Your physician (or physician assistant) felt no changes were needed at this time.  ____ Echocardiogram  ____ Cardiac Stress Test  __X__ Lab Work  ____ Peripheral vascular study of arms, legs or neck  ____ CT scan or X-ray  ____ Lung or Breathing test  ____ Other:   Thank you.   Hoover Brunette, LPN    Duane Boston, M.D., F.A.C.C. Thressa Sheller, M.D., F.A.C.C. Oneal Grout, M.D., F.A.C.C. Cheree Ditto, M.D., F.A.C.C. Daiva Nakayama, M.D., F.A.C.C. Kenney Houseman, M.D., F.A.C.C. Jeanne Ivan, PA-C

## 2010-07-30 LAB — GLUCOSE, CAPILLARY
Glucose-Capillary: 113 mg/dL — ABNORMAL HIGH (ref 70–99)
Glucose-Capillary: 115 mg/dL — ABNORMAL HIGH (ref 70–99)
Glucose-Capillary: 120 mg/dL — ABNORMAL HIGH (ref 70–99)
Glucose-Capillary: 121 mg/dL — ABNORMAL HIGH (ref 70–99)
Glucose-Capillary: 122 mg/dL — ABNORMAL HIGH (ref 70–99)
Glucose-Capillary: 133 mg/dL — ABNORMAL HIGH (ref 70–99)
Glucose-Capillary: 139 mg/dL — ABNORMAL HIGH (ref 70–99)
Glucose-Capillary: 141 mg/dL — ABNORMAL HIGH (ref 70–99)
Glucose-Capillary: 144 mg/dL — ABNORMAL HIGH (ref 70–99)
Glucose-Capillary: 157 mg/dL — ABNORMAL HIGH (ref 70–99)
Glucose-Capillary: 179 mg/dL — ABNORMAL HIGH (ref 70–99)
Glucose-Capillary: 190 mg/dL — ABNORMAL HIGH (ref 70–99)
Glucose-Capillary: 97 mg/dL (ref 70–99)
Glucose-Capillary: 110 mg/dL — ABNORMAL HIGH (ref 70–99)
Glucose-Capillary: 112 mg/dL — ABNORMAL HIGH (ref 70–99)
Glucose-Capillary: 120 mg/dL — ABNORMAL HIGH (ref 70–99)
Glucose-Capillary: 122 mg/dL — ABNORMAL HIGH (ref 70–99)
Glucose-Capillary: 140 mg/dL — ABNORMAL HIGH (ref 70–99)
Glucose-Capillary: 152 mg/dL — ABNORMAL HIGH (ref 70–99)
Glucose-Capillary: 154 mg/dL — ABNORMAL HIGH (ref 70–99)
Glucose-Capillary: 154 mg/dL — ABNORMAL HIGH (ref 70–99)

## 2010-07-30 LAB — BASIC METABOLIC PANEL
BUN: 58 mg/dL — ABNORMAL HIGH (ref 6–23)
BUN: 61 mg/dL — ABNORMAL HIGH (ref 6–23)
BUN: 21 mg/dL (ref 6–23)
BUN: 23 mg/dL (ref 6–23)
CO2: 22 mEq/L (ref 19–32)
CO2: 23 mEq/L (ref 19–32)
CO2: 24 mEq/L (ref 19–32)
Calcium: 10.2 mg/dL (ref 8.4–10.5)
Calcium: 10.2 mg/dL (ref 8.4–10.5)
Calcium: 9.7 mg/dL (ref 8.4–10.5)
Calcium: 9.4 mg/dL (ref 8.4–10.5)
Calcium: 9.5 mg/dL (ref 8.4–10.5)
Chloride: 107 mEq/L (ref 96–112)
Chloride: 109 mEq/L (ref 96–112)
Chloride: 109 mEq/L (ref 96–112)
Chloride: 103 mEq/L (ref 96–112)
Chloride: 104 mEq/L (ref 96–112)
Creatinine, Ser: 1.28 mg/dL — ABNORMAL HIGH (ref 0.4–1.2)
Creatinine, Ser: 1.33 mg/dL — ABNORMAL HIGH (ref 0.4–1.2)
Creatinine, Ser: 1.53 mg/dL — ABNORMAL HIGH (ref 0.4–1.2)
Creatinine, Ser: 0.97 mg/dL (ref 0.4–1.2)
Creatinine, Ser: 0.99 mg/dL (ref 0.4–1.2)
GFR calc Af Amer: 39 mL/min — ABNORMAL LOW (ref >60)
GFR calc Af Amer: 46 mL/min — ABNORMAL LOW (ref >60)
GFR calc Af Amer: >60 mL/min (ref >60)
GFR calc Af Amer: >60 mL/min (ref >60)
GFR calc non Af Amer: 32 mL/min — ABNORMAL LOW (ref >60)
GFR calc non Af Amer: 38 mL/min — ABNORMAL LOW (ref >60)
GFR calc non Af Amer: 55 mL/min — ABNORMAL LOW (ref >60)
GFR calc non Af Amer: 55 mL/min — ABNORMAL LOW (ref >60)
Glucose, Bld: 113 mg/dL — ABNORMAL HIGH (ref 70–99)
Glucose, Bld: 135 mg/dL — ABNORMAL HIGH (ref 70–99)
Glucose, Bld: 142 mg/dL — ABNORMAL HIGH (ref 70–99)
Glucose, Bld: 121 mg/dL — ABNORMAL HIGH (ref 70–99)
Glucose, Bld: 148 mg/dL — ABNORMAL HIGH (ref 70–99)
Potassium: 4.8 mEq/L (ref 3.5–5.1)
Potassium: 4.8 mEq/L (ref 3.5–5.1)
Potassium: 3.3 mEq/L — ABNORMAL LOW (ref 3.5–5.1)
Potassium: 3.6 mEq/L (ref 3.5–5.1)
Sodium: 139 mEq/L (ref 135–145)
Sodium: 140 mEq/L (ref 135–145)
Sodium: 143 mEq/L (ref 135–145)

## 2010-07-30 LAB — CARDIAC PANEL(CRET KIN+CKTOT+MB+TROPI)
CK, MB: 1.1 ng/mL (ref 0.3–4.0)
CK, MB: 1.3 ng/mL (ref 0.3–4.0)
CK, MB: 1.3 ng/mL (ref 0.3–4.0)
CK, MB: 0.9 ng/mL (ref 0.3–4.0)
CK, MB: 0.9 ng/mL (ref 0.3–4.0)
Relative Index: INVALID (ref 0.0–2.5)
Relative Index: INVALID (ref 0.0–2.5)
Relative Index: INVALID (ref 0.0–2.5)
Total CK: 22 U/L (ref 7–177)
Total CK: 23 U/L (ref 7–177)
Total CK: 28 U/L (ref 7–177)
Total CK: 28 U/L (ref 7–177)
Total CK: 30 U/L (ref 7–177)
Troponin I: 0.03 ng/mL (ref 0.00–0.06)
Troponin I: 0.04 ng/mL (ref 0.00–0.06)
Troponin I: 0.04 ng/mL (ref 0.00–0.06)
Troponin I: 0.05 ng/mL (ref 0.00–0.06)
Troponin I: 0.06 ng/mL (ref 0.00–0.06)

## 2010-07-30 LAB — CBC
HCT: 34.8 % — ABNORMAL LOW (ref 36.0–46.0)
HCT: 36.7 % (ref 36.0–46.0)
Hemoglobin: 12.1 g/dL (ref 12.0–15.0)
MCHC: 32.9 g/dL (ref 30.0–36.0)
MCHC: 33 g/dL (ref 30.0–36.0)
MCHC: 33.3 g/dL (ref 30.0–36.0)
MCV: 86.9 fL (ref 78.0–100.0)
Platelets: 192 10*3/uL (ref 150–400)
Platelets: 141 10*3/uL — ABNORMAL LOW (ref 150–400)
Platelets: 156 10*3/uL (ref 150–400)
Platelets: 158 10*3/uL (ref 150–400)
RDW: 15.3 % (ref 11.5–15.5)
RDW: 15.2 % (ref 11.5–15.5)
RDW: 15.2 % (ref 11.5–15.5)
RDW: 15.6 % — ABNORMAL HIGH (ref 11.5–15.5)
WBC: 10.2 10*3/uL (ref 4.0–10.5)

## 2010-07-30 LAB — COMPREHENSIVE METABOLIC PANEL
ALT: 16 U/L (ref 0–35)
AST: 20 U/L (ref 0–37)
Albumin: 3.6 g/dL (ref 3.5–5.2)
Alkaline Phosphatase: 68 U/L (ref 39–117)
GFR calc Af Amer: 41 mL/min — ABNORMAL LOW (ref >60)
Potassium: 5.4 mEq/L — ABNORMAL HIGH (ref 3.5–5.1)
Sodium: 139 mEq/L (ref 135–145)
Total Protein: 6.9 g/dL (ref 6.0–8.3)

## 2010-07-30 LAB — PROTIME-INR
INR: 1 (ref 0.00–1.49)
INR: 1 (ref 0.00–1.49)
Prothrombin Time: 13.1 seconds (ref 11.6–15.2)

## 2010-07-30 LAB — POCT I-STAT 3, ART BLOOD GAS (G3+)
Acid-Base Excess: 7 mmol/L — ABNORMAL HIGH (ref 0.0–2.0)
Bicarbonate: 32.6 mEq/L — ABNORMAL HIGH (ref 20.0–24.0)
pH, Arterial: 7.433 — ABNORMAL HIGH (ref 7.350–7.400)

## 2010-07-30 LAB — POCT I-STAT 3, VENOUS BLOOD GAS (G3P V)
Acid-Base Excess: 5 mmol/L — ABNORMAL HIGH (ref 0.0–2.0)
O2 Saturation: 67 %
TCO2: 33 mmol/L (ref 0–100)

## 2010-07-30 LAB — TSH: TSH: 3.351 u[IU]/mL (ref 0.350–4.500)

## 2010-09-29 ENCOUNTER — Other Ambulatory Visit: Payer: Self-pay | Admitting: *Deleted

## 2010-09-29 MED ORDER — CARVEDILOL 6.25 MG PO TABS: 6.2500 mg | ORAL_TABLET | Freq: Two times a day (BID) | ORAL | Status: DC

## 2010-10-06 ENCOUNTER — Encounter: Payer: Self-pay | Admitting: Cardiology

## 2010-10-16 ENCOUNTER — Ambulatory Visit: Payer: Self-pay | Admitting: Cardiology

## 2010-10-27 ENCOUNTER — Other Ambulatory Visit: Payer: Self-pay | Admitting: *Deleted

## 2010-10-27 MED ORDER — CLOPIDOGREL BISULFATE 75 MG PO TABS: 75.0000 mg | ORAL_TABLET | Freq: Every day | ORAL | Status: DC

## 2010-11-27 ENCOUNTER — Other Ambulatory Visit: Payer: Self-pay | Admitting: *Deleted

## 2010-11-27 MED ORDER — ATORVASTATIN CALCIUM 40 MG PO TABS: 40.0000 mg | ORAL_TABLET | Freq: Every day | ORAL | Status: DC

## 2010-12-09 DIAGNOSIS — R079 Chest pain, unspecified: Secondary | ICD-10-CM

## 2010-12-22 ENCOUNTER — Encounter: Payer: Self-pay | Admitting: Cardiology

## 2010-12-22 ENCOUNTER — Ambulatory Visit (INDEPENDENT_AMBULATORY_CARE_PROVIDER_SITE_OTHER): Payer: Medicare Other | Admitting: Cardiology

## 2010-12-22 VITALS — BP 153/81 | HR 78 | Ht 63.0 in | Wt 210.0 lb

## 2010-12-22 DIAGNOSIS — I2510 Atherosclerotic heart disease of native coronary artery without angina pectoris: Secondary | ICD-10-CM

## 2010-12-22 DIAGNOSIS — I10 Essential (primary) hypertension: Secondary | ICD-10-CM

## 2010-12-22 DIAGNOSIS — N259 Disorder resulting from impaired renal tubular function, unspecified: Secondary | ICD-10-CM

## 2010-12-22 MED ORDER — AMLODIPINE BESYLATE 5 MG PO TABS: 5.0000 mg | ORAL_TABLET | Freq: Every day | ORAL | Status: DC

## 2010-12-22 NOTE — Assessment & Plan Note (Signed)
Amlodipine 5 mg by mouth daily

## 2010-12-22 NOTE — Patient Instructions (Signed)
Your physician wants you to follow-up in: 6 months. You will receive a reminder letter in the mail one-two months in advance. If you don't receive a letter, please call our office to schedule the follow-up appointment. Start Norvasc (amlodipine) 5 mg daily.

## 2010-12-22 NOTE — Assessment & Plan Note (Signed)
Renal function has resolved with a creatinine of 0.87 and a creatinine clearance 60 ml/min

## 2010-12-22 NOTE — Assessment & Plan Note (Signed)
The patient is status post drug-eluting stent placement to the LAD and we'll continue with dual antiplatelet therapy. She appears to have rare episodes of angina and we will add amlodipine 5 mg a day to her medical regimen. Although there's a slight decline in her ejection fraction her symptoms are stable. Given her advanced age and comorbidities I would treat the patient medically unless she has unstable symptoms.

## 2010-12-22 NOTE — Progress Notes (Signed)
HPI The patient is a 77 year old female with a history of nonischemic cardiomyopathy. Previously her ejection fraction was normalized. She status post non-ST elevation myocardial infarction October 2010 and had a drug-eluting stent placed to the LAD. She has remained on aspirin and Plavix without any bleeding complications. She does use nitroglycerin occasionally about once or twice a month. She walks with a walker. But reports no orthopnea PND palpitations or syncope. Her blood pressure today however is poorly controlled. He also performed a limited echocardiogram which showed an ejection fraction of 40-45% with mild mitral regurgitation and septal akinesis. This is somewhat of a decline of her prior ejection fraction.  No Known Allergies  Current Outpatient Prescriptions on File Prior to Visit  Medication Sig Dispense Refill  . acetaminophen (TYLENOL) 325 MG tablet Take 650 mg by mouth every 4 (four) hours as needed.        Marland Kitchen aspirin 325 MG tablet Take 325 mg by mouth daily.        Marland Kitchen atorvastatin (LIPITOR) 40 MG tablet Take 1 tablet (40 mg total) by mouth daily.  30 tablet  6  . carvedilol (COREG) 6.25 MG tablet Take 1 tablet (6.25 mg total) by mouth 2 (two) times daily.  60 tablet  6  . clopidogrel (PLAVIX) 75 MG tablet Take 1 tablet (75 mg total) by mouth daily.  30 tablet  6  . furosemide (LASIX) 20 MG tablet Take 20 mg by mouth 2 (two) times daily.       Marland Kitchen lisinopril (PRINIVIL,ZESTRIL) 20 MG tablet Take 20 mg by mouth daily.        Marland Kitchen losartan (COZAAR) 50 MG tablet Take 50 mg by mouth daily.        . metFORMIN (GLUCOPHAGE) 500 MG tablet Take 1,000 mg by mouth daily.        . Multiple Vitamin (MULTIVITAMIN) tablet Take 1 tablet by mouth daily.        . nitroGLYCERIN (NITROSTAT) 0.4 MG SL tablet Place 0.4 mg under the tongue every 5 (five) minutes as needed.          Past Medical History  Diagnosis Date  . Chest pain   . Subendocardial infarction     Acute MI subendocardial infarct init  epis care  . Acute systolic heart failure   . CHF (congestive heart failure)   . Ischemic heart disease     other spec forms  . DM (diabetes mellitus)   . Hypertension   . Osteoarthritis     Unspec whether gen/loc unspec site  . Obesity   . Bradycardia   . Renal insufficiency     history of  . Myocardial infarction February 05, 2009    non-ST elevation myocardial infarction , status post drug eluting stent for a 90% proximal LAD lesion. Normal LV fuction after sent placement    Past Surgical History  Procedure Date  . Total abdominal hysterectomy   . Cataract extraction     Family History  Problem Relation Age of Onset  . Diabetes    . Hypertension    . Coronary artery disease      History   Social History  . Marital Status: Married    Spouse Name: N/A    Number of Children: N/A  . Years of Education: N/A   Occupational History  . Retired    Social History Main Topics  . Smoking status: Never Smoker   . Smokeless tobacco: Former Neurosurgeon    Types: Snuff  Quit date: 04/26/1990  . Alcohol Use: No  . Drug Use: No  . Sexually Active: Not on file   Other Topics Concern  . Not on file   Social History Narrative  . No narrative on file   [id number] positives as outlined above. The remainder of the 18  point review of systems is negative  PHYSICAL EXAM BP 153/81  Pulse 78  Ht 5\' 3"  (1.6 m)  Wt 210 lb (95.255 kg)  BMI 37.20 kg/m2 General: Patient walking with a walker  Head: Normocephalic and atraumatic Eyes:PERRLA/EOMI intact, conjunctiva and lids normal Ears: No deformity or lesions Mouth:normal dentition, normal posterior pharynx Neck: Supple, no JVD.  No masses, thyromegaly or abnormal cervical nodes Lungs: Normal breath sounds bilaterally without wheezing.  Normal percussion Cardiac: regular rate and rhythm with normal S1 and S2, no S3 or S4.  PMI is normal.  No pathological murmurs Abdomen: Normal bowel sounds, abdomen is soft and nontender without  masses, organomegaly or hernias noted.  No hepatosplenomegaly MSK: Back normal, normal gait muscle strength and tone normal Vascular: Pulse is normal in all 4 extremities Extremities: No peripheral pitting edema Neurologic: Alert and oriented x 3 Skin: Intact without lesions or rashes Lymphatics: No significant adenopathy Psychologic: Normal affect   ECG: Not available  ASSESSMENT AND PLAN

## 2011-05-20 ENCOUNTER — Other Ambulatory Visit: Payer: Self-pay | Admitting: *Deleted

## 2011-05-20 MED ORDER — CLOPIDOGREL BISULFATE 75 MG PO TABS: 75.0000 mg | ORAL_TABLET | Freq: Every day | ORAL | Status: DC

## 2011-09-08 ENCOUNTER — Encounter (HOSPITAL_COMMUNITY): Payer: Self-pay | Admitting: Pharmacy Technician

## 2011-09-10 NOTE — Discharge Instructions (Signed)
GARI TROVATO  09/10/2011     Instructions    Activity: No Restrictions.   Diet: Resume Diet you were on at home.   Pain Medication: Tylenol if Needed.   CONTACT YOUR DOCTOR IF YOU HAVE PAIN, REDNESS IN YOUR EYE, OR DECREASED VISION.   Follow-up:{follow up:32580} with Susa Simmonds, MD.   Dr. Lahoma Crocker: [phone]  Dr. Lita Mains: [phone]  Dr. Alto Denver: [phone]   If you find that you cannot contact your physician, but feel that your signs and   Symptoms warrant a physician's attention, call the Emergency Room at   [phone].   Other{NA AND YQMVHQIO:[id number]}.

## 2011-09-13 ENCOUNTER — Encounter (HOSPITAL_COMMUNITY)
Admission: RE | Disposition: A | Discharge status: Home / Self Care | Payer: Self-pay | Source: Ambulatory Visit | Attending: Ophthalmology

## 2011-09-13 ENCOUNTER — Ambulatory Visit (HOSPITAL_COMMUNITY)
Admission: RE | Admit: 2011-09-13 | Discharge: 2011-09-13 | Disposition: A | Discharge status: Home / Self Care | Payer: Medicare Other | Source: Ambulatory Visit | Attending: Ophthalmology | Admitting: Ophthalmology

## 2011-09-13 DIAGNOSIS — E119 Type 2 diabetes mellitus without complications: Secondary | ICD-10-CM | POA: Insufficient documentation

## 2011-09-13 DIAGNOSIS — H4010X0 Unspecified open-angle glaucoma, stage unspecified: Secondary | ICD-10-CM | POA: Insufficient documentation

## 2011-09-13 SURGERY — SLT LASER APPLICATION
Anesthesia: LOCAL | Laterality: Left

## 2011-09-13 MED ORDER — PILOCARPINE HCL 1 % OP SOLN
1.0000 [drp] | Freq: Once | OPHTHALMIC | Status: AC
  Administered 2011-09-13: 1 [drp] via OPHTHALMIC

## 2011-09-13 MED ORDER — APRACLONIDINE HCL 1 % OP SOLN
OPHTHALMIC | Status: AC
  Administered 2011-09-13: 1 [drp] via OPHTHALMIC
  Filled 2011-09-13: qty 0.1

## 2011-09-13 MED ORDER — TETRACAINE HCL 0.5 % OP SOLN
1.0000 [drp] | Freq: Two times a day (BID) | OPHTHALMIC | Status: DC
  Administered 2011-09-13: 1 [drp] via OPHTHALMIC

## 2011-09-13 MED ORDER — PILOCARPINE HCL 1 % OP SOLN
OPHTHALMIC | Status: AC
  Administered 2011-09-13: 1 [drp] via OPHTHALMIC
  Filled 2011-09-13: qty 15

## 2011-09-13 MED ORDER — APRACLONIDINE HCL 1 % OP SOLN
1.0000 [drp] | Freq: Once | OPHTHALMIC | Status: AC
  Administered 2011-09-13: 1 [drp] via OPHTHALMIC

## 2011-09-13 MED ORDER — APRACLONIDINE HCL 1 % OP SOLN
1.0000 [drp] | OPHTHALMIC | Status: AC
  Administered 2011-09-13 (×3): 1 [drp] via OPHTHALMIC

## 2011-09-13 MED ORDER — TETRACAINE HCL 0.5 % OP SOLN
OPHTHALMIC | Status: AC
  Administered 2011-09-13: 1 [drp] via OPHTHALMIC
  Filled 2011-09-13: qty 2

## 2011-09-13 NOTE — H&P (Signed)
See scanned H&P from office 

## 2011-09-13 NOTE — Procedures (Signed)
Brianna Chandler 09/13/2011  Susa Simmonds, MD  Yag Laser Self Test Completedyes. Procedure: Iridotomy/SLT, left eye.  Eye Protection Worn by Staff yes. Laser In Use Sign on Door yes.  Laser: Nd:YAG Spot Size: Fixed Burst Mode: III Power Setting: 1.0 mJ/burst Position treated: 360 deg Number of shots: 115 Total energy delivered: 116 mJ  Gonioscopy was performed:  Grade 4 open superiorly, grade 2 open inferiorly, no pigment, no syndechiae or recessions  The patient tolerated the procedure without difficulty. No complications were encountered.  Tenometer reading immediately after procedure: 26 mmHg.  The patient was discharged home with the instructions to continue all her current glaucoma medications, if any.   Patient instructed to call the office for significantly painful/aching eye for intraocular pressure check prn.  Patient verbalizes understanding of discharge instructions yes.   Notes:small heme in a/c, spontaneously stopped.

## 2011-09-14 ENCOUNTER — Other Ambulatory Visit: Payer: Self-pay | Admitting: *Deleted

## 2011-09-14 MED ORDER — AMLODIPINE BESYLATE 5 MG PO TABS: 5.0000 mg | ORAL_TABLET | Freq: Every day | ORAL | Status: DC

## 2011-09-14 MED ORDER — ATORVASTATIN CALCIUM 40 MG PO TABS: 40.0000 mg | ORAL_TABLET | Freq: Every day | ORAL | Status: AC

## 2012-01-10 DIAGNOSIS — I498 Other specified cardiac arrhythmias: Secondary | ICD-10-CM

## 2012-01-11 ENCOUNTER — Inpatient Hospital Stay (HOSPITAL_COMMUNITY)
Admission: RE | Admit: 2012-01-11 | Discharge: 2012-01-14 | DRG: 243 | Disposition: A | Discharge status: Skilled Nursing Facility | Payer: Medicare Other | Source: Other Acute Inpatient Hospital | Attending: Internal Medicine | Admitting: Internal Medicine

## 2012-01-11 ENCOUNTER — Inpatient Hospital Stay (HOSPITAL_COMMUNITY): Payer: Medicare Other

## 2012-01-11 ENCOUNTER — Other Ambulatory Visit: Payer: Self-pay | Admitting: Physician Assistant

## 2012-01-11 DIAGNOSIS — Z79899 Other long term (current) drug therapy: Secondary | ICD-10-CM

## 2012-01-11 DIAGNOSIS — I10 Essential (primary) hypertension: Secondary | ICD-10-CM | POA: Diagnosis present

## 2012-01-11 DIAGNOSIS — R001 Bradycardia, unspecified: Secondary | ICD-10-CM

## 2012-01-11 DIAGNOSIS — I498 Other specified cardiac arrhythmias: Secondary | ICD-10-CM

## 2012-01-11 DIAGNOSIS — Z7902 Long term (current) use of antithrombotics/antiplatelets: Secondary | ICD-10-CM

## 2012-01-11 DIAGNOSIS — Z23 Encounter for immunization: Secondary | ICD-10-CM

## 2012-01-11 DIAGNOSIS — R5381 Other malaise: Secondary | ICD-10-CM | POA: Diagnosis not present

## 2012-01-11 DIAGNOSIS — R55 Syncope and collapse: Secondary | ICD-10-CM

## 2012-01-11 DIAGNOSIS — I5022 Chronic systolic (congestive) heart failure: Secondary | ICD-10-CM | POA: Diagnosis present

## 2012-01-11 DIAGNOSIS — E669 Obesity, unspecified: Secondary | ICD-10-CM | POA: Diagnosis present

## 2012-01-11 DIAGNOSIS — I2510 Atherosclerotic heart disease of native coronary artery without angina pectoris: Secondary | ICD-10-CM | POA: Diagnosis present

## 2012-01-11 DIAGNOSIS — E119 Type 2 diabetes mellitus without complications: Secondary | ICD-10-CM | POA: Diagnosis present

## 2012-01-11 DIAGNOSIS — Z9861 Coronary angioplasty status: Secondary | ICD-10-CM

## 2012-01-11 DIAGNOSIS — I2589 Other forms of chronic ischemic heart disease: Secondary | ICD-10-CM | POA: Diagnosis present

## 2012-01-11 DIAGNOSIS — I252 Old myocardial infarction: Secondary | ICD-10-CM

## 2012-01-11 DIAGNOSIS — I509 Heart failure, unspecified: Secondary | ICD-10-CM | POA: Diagnosis present

## 2012-01-11 DIAGNOSIS — E785 Hyperlipidemia, unspecified: Secondary | ICD-10-CM | POA: Diagnosis present

## 2012-01-11 DIAGNOSIS — M1990 Unspecified osteoarthritis, unspecified site: Secondary | ICD-10-CM | POA: Diagnosis present

## 2012-01-11 HISTORY — DX: Bradycardia, unspecified: R00.1

## 2012-01-11 HISTORY — DX: Syncope and collapse: R55

## 2012-01-11 LAB — MAGNESIUM: Magnesium: 1.9 mg/dL (ref 1.5–2.5)

## 2012-01-11 LAB — BASIC METABOLIC PANEL
CO2: 23 mEq/L (ref 19–32)
GFR calc non Af Amer: 74 mL/min — ABNORMAL LOW (ref >90)
Glucose, Bld: 88 mg/dL (ref 70–99)
Potassium: 4.2 mEq/L (ref 3.5–5.1)
Sodium: 141 mEq/L (ref 135–145)

## 2012-01-11 LAB — GLUCOSE, CAPILLARY
Glucose-Capillary: 88 mg/dL (ref 70–99)
Glucose-Capillary: 126 mg/dL — ABNORMAL HIGH (ref 70–99)

## 2012-01-11 LAB — MRSA PCR SCREENING: MRSA by PCR: POSITIVE — AB

## 2012-01-11 MED ORDER — ACETAMINOPHEN 325 MG PO TABS: 650.0000 mg | ORAL_TABLET | ORAL | Status: DC | PRN

## 2012-01-11 MED ORDER — SODIUM CHLORIDE 0.9 % IJ SOLN
3.0000 mL | Freq: Two times a day (BID) | INTRAMUSCULAR | Status: DC
  Administered 2012-01-11: 3 mL via INTRAVENOUS

## 2012-01-11 MED ORDER — CHLORHEXIDINE GLUCONATE 4 % EX LIQD
60.0000 mL | Freq: Once | CUTANEOUS | Status: AC
  Administered 2012-01-12: 4 via TOPICAL
  Filled 2012-01-11 (×2): qty 60

## 2012-01-11 MED ORDER — SODIUM CHLORIDE 0.9 % IJ SOLN: 3.0000 mL | INTRAMUSCULAR | Status: DC | PRN

## 2012-01-11 MED ORDER — SODIUM CHLORIDE 0.45 % IV SOLN
INTRAVENOUS | Status: DC
  Administered 2012-01-12: 06:00:00 via INTRAVENOUS

## 2012-01-11 MED ORDER — SODIUM CHLORIDE 0.9 % IR SOLN
80.0000 mg | Status: DC
  Filled 2012-01-11 (×2): qty 2

## 2012-01-11 MED ORDER — INSULIN ASPART 100 UNIT/ML ~~LOC~~ SOLN
0.0000 [IU] | Freq: Three times a day (TID) | SUBCUTANEOUS | Status: DC
  Administered 2012-01-12 – 2012-01-13 (×3): 1 [IU] via SUBCUTANEOUS
  Administered 2012-01-13: 13:00:00 2 [IU] via SUBCUTANEOUS
  Administered 2012-01-13: 3 [IU] via SUBCUTANEOUS
  Administered 2012-01-14: 2 [IU] via SUBCUTANEOUS
  Administered 2012-01-14: 1 [IU] via SUBCUTANEOUS

## 2012-01-11 MED ORDER — LOSARTAN POTASSIUM 50 MG PO TABS
100.0000 mg | ORAL_TABLET | Freq: Every day | ORAL | Status: DC
  Filled 2012-01-11: qty 2

## 2012-01-11 MED ORDER — CHLORHEXIDINE GLUCONATE 4 % EX LIQD
60.0000 mL | Freq: Once | CUTANEOUS | Status: DC
  Filled 2012-01-11: qty 60

## 2012-01-11 MED ORDER — DIAZEPAM 5 MG PO TABS
5.0000 mg | ORAL_TABLET | ORAL | Status: AC
  Administered 2012-01-12: 5 mg via ORAL
  Filled 2012-01-11: qty 1

## 2012-01-11 MED ORDER — HYDRALAZINE HCL 20 MG/ML IJ SOLN
10.0000 mg | INTRAMUSCULAR | Status: DC | PRN
  Administered 2012-01-11 – 2012-01-13 (×2): 10 mg via INTRAVENOUS
  Filled 2012-01-11: qty 0.5
  Filled 2012-01-11: qty 1
  Filled 2012-01-11: qty 0.5

## 2012-01-11 MED ORDER — INSULIN ASPART 100 UNIT/ML ~~LOC~~ SOLN: 0.0000 [IU] | Freq: Every day | SUBCUTANEOUS | Status: DC

## 2012-01-11 MED ORDER — CHLORHEXIDINE GLUCONATE 4 % EX LIQD
60.0000 mL | Freq: Once | CUTANEOUS | Status: AC
  Administered 2012-01-11: 4 via TOPICAL
  Filled 2012-01-11: qty 60

## 2012-01-11 MED ORDER — ASPIRIN EC 81 MG PO TBEC
81.0000 mg | DELAYED_RELEASE_TABLET | Freq: Every day | ORAL | Status: DC
  Administered 2012-01-12: 81 mg via ORAL
  Filled 2012-01-11: qty 1

## 2012-01-11 MED ORDER — MUPIROCIN 2 % EX OINT
1.0000 "application " | TOPICAL_OINTMENT | Freq: Two times a day (BID) | CUTANEOUS | Status: DC
  Administered 2012-01-11 – 2012-01-14 (×5): 1 via NASAL
  Filled 2012-01-11 (×2): qty 22

## 2012-01-11 MED ORDER — HYDRALAZINE HCL 20 MG/ML IJ SOLN
10.0000 mg | INTRAMUSCULAR | Status: DC | PRN
  Administered 2012-01-11: 10 mg via INTRAVENOUS
  Filled 2012-01-11: qty 1

## 2012-01-11 MED ORDER — ONDANSETRON HCL 4 MG/2ML IJ SOLN: 4.0000 mg | Freq: Four times a day (QID) | INTRAMUSCULAR | Status: DC | PRN

## 2012-01-11 MED ORDER — CHLORHEXIDINE GLUCONATE CLOTH 2 % EX PADS
6.0000 | MEDICATED_PAD | Freq: Every day | CUTANEOUS | Status: DC
  Administered 2012-01-12 – 2012-01-14 (×3): 6 via TOPICAL

## 2012-01-11 MED ORDER — SODIUM CHLORIDE 0.9 % IJ SOLN
3.0000 mL | Freq: Two times a day (BID) | INTRAMUSCULAR | Status: DC
  Administered 2012-01-11 – 2012-01-14 (×6): 3 mL via INTRAVENOUS

## 2012-01-11 MED ORDER — AMLODIPINE BESYLATE 5 MG PO TABS
5.0000 mg | ORAL_TABLET | Freq: Every day | ORAL | Status: DC
  Administered 2012-01-12: 5 mg via ORAL
  Filled 2012-01-11: qty 1

## 2012-01-11 MED ORDER — NITROGLYCERIN 0.4 MG SL SUBL: 0.4000 mg | SUBLINGUAL_TABLET | SUBLINGUAL | Status: DC | PRN

## 2012-01-11 MED ORDER — ATORVASTATIN CALCIUM 40 MG PO TABS
40.0000 mg | ORAL_TABLET | Freq: Every day | ORAL | Status: DC
  Administered 2012-01-11: 40 mg via ORAL
  Filled 2012-01-11 (×2): qty 1

## 2012-01-11 MED ORDER — CEFAZOLIN SODIUM-DEXTROSE 2-3 GM-% IV SOLR
2.0000 g | INTRAVENOUS | Status: DC
  Filled 2012-01-11 (×2): qty 50

## 2012-01-11 MED ORDER — CLOPIDOGREL BISULFATE 75 MG PO TABS
75.0000 mg | ORAL_TABLET | Freq: Every day | ORAL | Status: DC
  Administered 2012-01-12: 75 mg via ORAL
  Filled 2012-01-11 (×2): qty 1

## 2012-01-11 MED ORDER — ADULT MULTIVITAMIN W/MINERALS CH
1.0000 | ORAL_TABLET | Freq: Every day | ORAL | Status: DC
  Administered 2012-01-11 – 2012-01-14 (×4): 1 via ORAL
  Filled 2012-01-11 (×4): qty 1

## 2012-01-11 MED ORDER — SODIUM CHLORIDE 0.9 % IV SOLN
250.0000 mL | INTRAVENOUS | Status: DC | PRN
Start: 2012-01-11 — End: 2012-01-14

## 2012-01-11 NOTE — Progress Notes (Signed)
Pt here from [facility] with bradycardia. Her HR is in the 30s, occasionally into the 40s. SBP 170s. Pt currently denies chest pain or SOB. Pt currently without presyncope/dizziness. Added PRN hydralazine for BP control. Pt on for PPM in am.

## 2012-01-11 NOTE — H&P (Signed)
NAME:  Brianna Chandler, Brianna Chandler [facility] [place] ROOM: 250  UNIT NUMBER:  [id number] LOCATION: ICCU 250 03 ADM/VISIT DATE:  01/10/2012   ADM Brianna BrownerKathaleen Chandler:  [id number] DOB: Jul 15, 1923   PRIMARY CARDIOLOGIST:  Previously Dr. Andee Lineman with [facility].  PRIMARY MEDICAL DOCTOR:  Wyvonnia Lora, M.D.  REASON FOR CONSULTATION:  Syncope and bradycardia.  CHIEF COMPLAINT:  Passed out.  HISTORY OF PRESENT ILLNESS:  Brianna Chandler is an 78 year old female with a history of coronary artery disease, status post drug eluting stent to the LAD in 2010, ischemic cardiomyopathy, diabetes, hypertension, prior mention of bradycardia.  She presented with a syncopal episode in the setting of significant bradycardia.  Yesterday while sitting in a chair with her dog, she felt extremely hot with darkening of her vision, and apparently passed out.  Her daughter was reportedly present and she presented to [facility] thereafter, where he was found to be markedly bradycardic with heart rates in the 30s.  She reports for the past several weeks, she has had intermittent dimming vision with some dizziness.  She also endorses a vague sensation of chest tightness for the past 2 days, somewhat constant and low grade, not associated with anything, not particularly aggravated or relieved by anything.  She has also had some shortness of breath and nausea.  EKG at [facility] revealed junctional bradycardia.  She has not received any chronotropic support as she was hemodynamically stable and mentating well.  Coreg was placed on hold (she was taking 6.25 mg b.i.d. at home).  Troponin is negative times one.  CT of the head was nonacute.  CT angiogram which was done for elevated D-dimer was negative for PE.  Blood pressure is maintaining in the 140s-150s.  She currently feels slightly dizzy, but overall no complaints including chest pain or shortness of breath.  PAST MEDICAL HISTORY: 1. Coronary artery disease status post drug-eluting stent to the LAD  in 01/2009, with nuclear study in 11/2010 showing probably normal LV perfusion, EF 58%; small, fixed apical to mid-inferior defect associated with normal wall motion.  Second small partially reversible basal inferior defect associated with normal wall motion, probably attenuation artifact. 2. Ischemic cardiomyopathy/chronic systolic heart failure.  There is report she had a previous EF of 27%; however, office note of 11/2010 alludes to a limited echocardiogram done at that time with EF of 40% to 45% and mild MR. 3. Diabetes mellitus. 4. Hypertension. 5. Hyperlipidemia. 6. History of bradycardia per chart. 7. History of renal insufficiency, previously resolved per 11/2010 note.  INPATIENT MEDICATIONS: 1. Losartan 100 mg daily. 2. Norvasc 5 mg daily. 3. Metformin 1000 mg daily. 4. Aspirin 81 plus 162 mg daily. 5. Plavix 75 mg daily.  OUTPATIENT MEDICATION:  Also include: 1. Lisinopril 20 mg daily on top of her ARB. 2. Atorvastatin 40 mg daily. 3. Aspirin 325 mg instead of the 243 that she was on here. 4. Multivitamin. 5. Coreg 6.25 mg b.i.d. is on hold.  ALLERGIES:  No known drug allergies.  SOCIAL HISTORY:  Brianna Chandler denies any tobacco or alcohol use.  Her daughter lives with her and has her own medical problems including congestive heart failure.  She ambulates with a walker at home.  FAMILY HISTORY:  Negative for premature coronary artery disease.  REVIEW OF SYSTEMS:  No fever, chills, emesis, diarrhea, hematochezia, melena, abdominal pain.  See HPI.  All other systems reviewed and otherwise negative.  LABORATORY:  WBC 9100, hemoglobin 12.9, hematocrit 39.1, platelet count 143,000.  Sodium 141, potassium  4.6, chloride 108, CO2 25, glucose 130, BUN 24, creatinine 0.91.  CK 308, MB 0.9, troponin 0.01, D-dimer 0.77.  BNP 314, INR within normal limits at 1.0 with PT 13.9.  IMAGING: 1. Admission EKG on 01/10/2012 showed junctional bradycardia at 36 BPM with complete left bundle branch  block. 2. EKG on 01/11/2012 showed bradycardia, 38 beats per minute, incomplete left bundle branch block. There do appear to be occasional P waves with junctional beats.  Note, prior EKG, 05/2011 showed incomplete left bundle branch block, sinus bradycardia, 52 beats per minute with left axis deviation. 3. A chest x-ray showed enlargement of the cardiac silhouette, minimal right basilar atelectasis. 4. CT of the head showed no acute findings, stable, age related cerebral atrophy with ventriculomegaly and white matter disease. 5. CT angio showed mild cardiac enlargement, no PE, moderate atherosclerosis of the aorta.  PHYSICAL EXAMINATION:  Vital signs:  Temperature 97.7, pulse 35, respirations 19, blood pressure 145/50, pulse ox 98% on 2 L.  General:  This is an obese, pleasant white female in no acute distress.  HEENT:  Normocephalic, atraumatic with extraocular movements intact and clear sclerae. Nares are without discharge.  Neck:  Supple.  Neck habitus makes JVD assessment difficult.  No carotid bruits on auscultation.  Heart:  Reveals bradycardic rate, regular rhythm with S1, S2 without significant murmurs, rubs or gallops.  Lungs:  Clear to auscultation bilaterally without wheezes, rales or rhonchi, and breathing is unlabored.  Abdomen:  Soft, nontender, nondistended with positive bowel sounds.  Extremities:  Warm, dry and without edema.  She has 2+ pedal pulses bilaterally.  Neurologic:  She is alert and oriented x3 and responds to questions appropriately with a normal affect.  Strength is 5/5 throughout and equal bilaterally.  No focal deficit.  ASSESSMENT AND PLAN: 1. Syncope. 2. Significant bradycardia. 3. Ischemic cardiomegaly/chronic systolic heart failure with most recent EF 40% to 45% on 11/2010. 4. Coronary artery disease/drug-eluting stent to the left anterior descending in 2010. 5. Diabetes mellitus. 6. Hypertension.  Patient was seen and examined by Dr. Diona Chandler and myself.  He has  reviewed the records and discussed the case with myself.  This patient presents with intermittent lightheadedness, nausea, diaphoresis and fatigue for several weeks, culminating in an episode of syncope.  She has evidence of symptomatic bradycardia with sick sinus syndrome.  Coreg has been on hold since yesterday, heart rate 30-40 junctional and sinus bradycardia.  At this time, plan is to transfer to [facility], obtain 2-D echocardiogram, and have EP evaluate the patient as she will likely need pacemaker implantation.  This plan was discussed with her.  Obviously we will continue to hold Coreg.  We will resume her atorvastatin and multivitamin at [facility], but hold off on lisinopril as it is unclear why she is on both ACE and ARB at this time.  Given that Coreg is held, she may require further augmentation of blood pressure control with other agent.   __________________________    Ronie Spies, P.A. Alinda Money D: 01/11/2012 1610 T: 01/11/2012 1054 P: [id number]  Attending note:  Patient seen and examined. Reviewed records and discussed the case with Brianna Chandler. Medical history is outlined above. Brianna Chandler reports a history of intermittent lightheadedness, visual "dimness," and fatigue over the last few weeks culminating in an episode of syncope while she was seated. She is noted to be significantly bradycardic with heart rates in the 30s, initially junctional rhythm, some evidence of sinus bradycardia. Medications reviewed, and Corag been  held since yesterday, although heart rate still remains in the high 30s. She has otherwise been hemodynamically stable. No definite angina symptoms. After discussing the situation with her, plan is to arrange transfer to [facility] for an EP consultation, also echocardiogram to followup on LV function. Suspect that pacemaker implantation will be necessary, unless there is a significant improvement in her heart rate in the short-term.  Jonelle Sidle,  M.D., F.A.C.C.

## 2012-01-11 NOTE — Progress Notes (Signed)
Utilization review completed.  

## 2012-01-11 NOTE — Progress Notes (Signed)
Pt arrived via [facility] from [facility].  Pt AOx4, HR 30s-40s, BP 175/43.  Notified Trish Museum/gallery exhibitions officer) of pt's arrival, MD to come to bedside.    Roselie Awkward, RN

## 2012-01-11 NOTE — Progress Notes (Signed)
Notified Theodore Demark, NP at 1755 of pt's BP 184/39 after prn hydralazine given at 1720.  Prn dose changed to q2h.  Dr. Johney Frame at bedside at 1815, aware of BP and ok with giving hydralazine q2h as needed.  Will continue to monitor.  Roselie Awkward, RN

## 2012-01-12 ENCOUNTER — Encounter (HOSPITAL_COMMUNITY)
Admission: RE | Disposition: A | Discharge status: Skilled Nursing Facility | Payer: Self-pay | Source: Other Acute Inpatient Hospital | Attending: Internal Medicine

## 2012-01-12 DIAGNOSIS — I498 Other specified cardiac arrhythmias: Principal | ICD-10-CM

## 2012-01-12 DIAGNOSIS — I059 Rheumatic mitral valve disease, unspecified: Secondary | ICD-10-CM

## 2012-01-12 HISTORY — PX: PERMANENT PACEMAKER INSERTION: SHX5480

## 2012-01-12 LAB — COMPREHENSIVE METABOLIC PANEL
ALT: 11 U/L (ref 0–35)
AST: 12 U/L (ref 0–37)
Albumin: 3.2 g/dL — ABNORMAL LOW (ref 3.5–5.2)
Alkaline Phosphatase: 70 U/L (ref 39–117)
BUN: 19 mg/dL (ref 6–23)
Chloride: 111 mEq/L (ref 96–112)
Potassium: 4.2 mEq/L (ref 3.5–5.1)
Sodium: 145 mEq/L (ref 135–145)
Total Bilirubin: 0.4 mg/dL (ref 0.3–1.2)
Total Protein: 5.9 g/dL — ABNORMAL LOW (ref 6.0–8.3)

## 2012-01-12 LAB — CBC
HCT: 39.6 % (ref 36.0–46.0)
MCHC: 32.3 g/dL (ref 30.0–36.0)
RDW: 12.8 % (ref 11.5–15.5)
WBC: 12.1 10*3/uL — ABNORMAL HIGH (ref 4.0–10.5)

## 2012-01-12 LAB — GLUCOSE, CAPILLARY
Glucose-Capillary: 127 mg/dL — ABNORMAL HIGH (ref 70–99)
Glucose-Capillary: 109 mg/dL — ABNORMAL HIGH (ref 70–99)

## 2012-01-12 LAB — TSH: TSH: 1.794 u[IU]/mL (ref 0.350–4.500)

## 2012-01-12 SURGERY — PERMANENT PACEMAKER INSERTION
Anesthesia: Moderate Sedation | Laterality: Left

## 2012-01-12 MED ORDER — CEFAZOLIN SODIUM-DEXTROSE 2-3 GM-% IV SOLR
2.0000 g | Freq: Four times a day (QID) | INTRAVENOUS | Status: AC
  Administered 2012-01-12 – 2012-01-13 (×3): 2 g via INTRAVENOUS
  Filled 2012-01-12 (×4): qty 50

## 2012-01-12 MED ORDER — ONDANSETRON HCL 4 MG/2ML IJ SOLN: 4.0000 mg | Freq: Four times a day (QID) | INTRAMUSCULAR | Status: DC | PRN

## 2012-01-12 MED ORDER — INFLUENZA VIRUS VACC SPLIT PF IM SUSP
0.5000 mL | INTRAMUSCULAR | Status: AC
  Administered 2012-01-13: 0.5 mL via INTRAMUSCULAR
  Filled 2012-01-12: qty 0.5

## 2012-01-12 MED ORDER — PNEUMOCOCCAL VAC POLYVALENT 25 MCG/0.5ML IJ INJ
0.5000 mL | INJECTION | INTRAMUSCULAR | Status: AC
  Administered 2012-01-13: 0.5 mL via INTRAMUSCULAR
  Filled 2012-01-12: qty 0.5

## 2012-01-12 MED ORDER — CARVEDILOL 12.5 MG PO TABS
12.5000 mg | ORAL_TABLET | Freq: Two times a day (BID) | ORAL | Status: DC
  Administered 2012-01-13 – 2012-01-14 (×3): 12.5 mg via ORAL
  Filled 2012-01-12 (×5): qty 1

## 2012-01-12 MED ORDER — MIDAZOLAM HCL 2 MG/2ML IJ SOLN
INTRAMUSCULAR | Status: AC
  Filled 2012-01-12: qty 2

## 2012-01-12 MED ORDER — ASPIRIN 325 MG PO TABS
325.0000 mg | ORAL_TABLET | Freq: Every morning | ORAL | Status: DC
  Administered 2012-01-13 – 2012-01-14 (×2): 325 mg via ORAL
  Filled 2012-01-12 (×2): qty 1

## 2012-01-12 MED ORDER — LIDOCAINE HCL (PF) 1 % IJ SOLN
INTRAMUSCULAR | Status: AC
  Filled 2012-01-12: qty 60

## 2012-01-12 MED ORDER — LOSARTAN POTASSIUM 50 MG PO TABS
50.0000 mg | ORAL_TABLET | Freq: Every morning | ORAL | Status: DC
  Administered 2012-01-13 – 2012-01-14 (×2): 50 mg via ORAL
  Filled 2012-01-12 (×2): qty 1

## 2012-01-12 MED ORDER — ACETAMINOPHEN 325 MG PO TABS
325.0000 mg | ORAL_TABLET | ORAL | Status: DC | PRN
  Administered 2012-01-12 – 2012-01-13 (×3): 650 mg via ORAL
  Administered 2012-01-13: 11:00:00 325 mg via ORAL
  Filled 2012-01-12 (×4): qty 2
  Filled 2012-01-12: qty 1

## 2012-01-12 MED ORDER — HEPARIN (PORCINE) IN NACL 2-0.9 UNIT/ML-% IJ SOLN
INTRAMUSCULAR | Status: AC
  Filled 2012-01-12: qty 500

## 2012-01-12 MED ORDER — FUROSEMIDE 20 MG PO TABS
20.0000 mg | ORAL_TABLET | Freq: Two times a day (BID) | ORAL | Status: DC
  Administered 2012-01-12 – 2012-01-14 (×4): 20 mg via ORAL
  Filled 2012-01-12 (×6): qty 1

## 2012-01-12 MED ORDER — AMLODIPINE BESYLATE 5 MG PO TABS
5.0000 mg | ORAL_TABLET | Freq: Every day | ORAL | Status: DC
  Administered 2012-01-12 – 2012-01-14 (×3): 5 mg via ORAL
  Filled 2012-01-12 (×3): qty 1

## 2012-01-12 MED ORDER — ATORVASTATIN CALCIUM 40 MG PO TABS
40.0000 mg | ORAL_TABLET | Freq: Every day | ORAL | Status: DC
  Administered 2012-01-12 – 2012-01-13 (×2): 40 mg via ORAL
  Filled 2012-01-12 (×3): qty 1

## 2012-01-12 MED ORDER — LISINOPRIL 20 MG PO TABS
20.0000 mg | ORAL_TABLET | Freq: Every morning | ORAL | Status: DC
  Administered 2012-01-13 – 2012-01-14 (×2): 20 mg via ORAL
  Filled 2012-01-12 (×2): qty 1

## 2012-01-12 MED ORDER — FENTANYL CITRATE 0.05 MG/ML IJ SOLN
INTRAMUSCULAR | Status: AC
  Filled 2012-01-12: qty 2

## 2012-01-12 NOTE — Progress Notes (Signed)
Echocardiogram 2D Echocardiogram has been performed.  Elaysha Bevard 01/12/2012, 11:30 AM

## 2012-01-12 NOTE — H&P (Signed)
HPI: The patient is an 78 yo woman with CAD, chronic mixed CHF, and HTN. She was admitted to the hospital with symptomatic bradycardia. Her notes suggest intermittant LBBB but I can only find incomplete LBBB. She has a h/o near syncope and dizzy spells. She has been on low dose coreg but has very severe hypertension. No electrolyte abnormalities. She denies chest pain. She has minimal dyspnea with exertion. Prior echo demonstrated mild LV dysfunction.  PMH:  Past Medical History   Diagnosis  Date   .  Chest pain    .  Subendocardial infarction      Acute MI subendocardial infarct init epis care   .  Acute systolic heart failure    .  CHF (congestive heart failure)    .  Ischemic heart disease      other spec forms   .  DM (diabetes mellitus)    .  Hypertension    .  Osteoarthritis      Unspec whether gen/loc unspec site   .  Obesity    .  Bradycardia    .  Renal insufficiency      history of   .  Myocardial infarction  February 05, 2009     non-ST elevation myocardial infarction , status post drug eluting stent for a 90% proximal LAD lesion. Normal LV fuction after sent placement    PSHX:  Past Surgical History   Procedure  Date   .  Total abdominal hysterectomy    .  Cataract extraction     FAMHX:  Family History   Problem  Relation  Age of Onset   .  Diabetes     .  Hypertension     .  Coronary artery disease      Social History: reports that she has never smoked. She quit smokeless tobacco use about 21 years ago. Her smokeless tobacco use included Snuff. She reports that she does not drink alcohol or use illicit drugs.  Allergies: No Known Allergies  Medications:reviewed  Dg Chest Port 1 View  01/11/2012 *RADIOLOGY REPORT* Clinical Data: 78 year old female with bradycardia. PORTABLE CHEST - 1 VIEW Comparison: [facility] chest CTA 01/10/2012 and earlier. Findings: Portable semi upright AP view 2013 hours. Stable cardiomegaly and mediastinal contours. Stable  lung volumes. No pneumothorax or pleural effusion and allowing for portable technique the lungs remain clear. IMPRESSION: No acute cardiopulmonary abnormality. Original Report Authenticated By: Ulla Potash III, M.D.   ROS  As stated in the HPI and negative for all other systems.  Physical Exam  Vitals:Blood pressure 145/47, pulse 57, temperature 98.4 F (36.9 C), temperature source Oral, resp. rate 29, height 5\' 2"  (1.575 m), weight 208 lb 12.4 oz (94.7 kg), SpO2 99.00%.  Well appearing obese, elderly woman, NAD  HEENT: Unremarkable  Neck: No JVD, no thyromegally  Lymphatics: No adenopathy  Back: No CVA tenderness  Lungs: Clear  HEART: Regular brady rhythm, no murmurs, no rubs, no clicks  Abd: Soft, obese, positive bowel sounds, no organomegally, no rebound, no guarding  Ext: 2 plus pulses, no edema, no cyanosis, no clubbing  Skin: No rashes no nodules  Neuro: CN II through XII intact, motor grossly intact  ECG - NSR with poor R wave progression.  Assessment/Plan:  1. Symptomatic bradycardia  2. CAD  3. Mild LV dysfunction  4. Renal insufficiency  Rec: In light of multiple comorbidities, I would recommend proceeding with PPM followed by uptitration of her medical therapy for CAD and CHF.  I have discussed the risks/benefits/goals/expectations of PPM with the patient and she wishes to proceed.  Lewayne Bunting, M.D.

## 2012-01-12 NOTE — Op Note (Signed)
DDD PPM inserted via the left subclavian vein without immediate complication. [id number].

## 2012-01-12 NOTE — Consult Note (Signed)
Reason for Consult:bradycardia, symptomatic  Referring Physician: Lauralynn Loeb is an 78 y.o. female.   HPI: The patient is an 78 yo woman with CAD, chronic mixed CHF, and HTN. She was admitted to the hospital with symptomatic bradycardia. Her notes suggest intermittant LBBB but I can only find incomplete LBBB. She has a h/o near syncope and dizzy spells. She has been on low dose coreg but has very severe hypertension. No electrolyte abnormalities. She denies chest pain. She has minimal dyspnea with exertion. Prior echo demonstrated mild LV dysfunction.   PMH: Past Medical History  Diagnosis Date  . Chest pain   . Subendocardial infarction     Acute MI subendocardial infarct init epis care  . Acute systolic heart failure   . CHF (congestive heart failure)   . Ischemic heart disease     other spec forms  . DM (diabetes mellitus)   . Hypertension   . Osteoarthritis     Unspec whether gen/loc unspec site  . Obesity   . Bradycardia   . Renal insufficiency     history of  . Myocardial infarction February 05, 2009    non-ST elevation myocardial infarction , status post drug eluting stent for a 90% proximal LAD lesion. Normal LV fuction after sent placement    PSHX: Past Surgical History  Procedure Date  . Total abdominal hysterectomy   . Cataract extraction     FAMHX: Family History  Problem Relation Age of Onset  . Diabetes    . Hypertension    . Coronary artery disease      Social History:  reports that she has never smoked. She quit smokeless tobacco use about 21 years ago. Her smokeless tobacco use included Snuff. She reports that she does not drink alcohol or use illicit drugs.  Allergies: No Known Allergies  Medications:reviewed  Dg Chest Port 1 View  01/11/2012  *RADIOLOGY REPORT*  Clinical Data: 78 year old female with bradycardia.  PORTABLE CHEST - 1 VIEW  Comparison: [facility] chest CTA 01/10/2012 and earlier.  Findings: Portable  semi upright AP view 2013 hours. Stable cardiomegaly and mediastinal contours.  Stable lung volumes.  No pneumothorax or pleural effusion and allowing for portable technique the lungs remain clear.  IMPRESSION: No acute cardiopulmonary abnormality.   Original Report Authenticated By: Ulla Potash III, M.D.     ROS  As stated in the HPI and negative for all other systems.  Physical Exam  Vitals:Blood pressure 145/47, pulse 57, temperature 98.4 F (36.9 C), temperature source Oral, resp. rate 29, height 5\' 2"  (1.575 m), weight 208 lb 12.4 oz (94.7 kg), SpO2 99.00%.  Well appearing obese, elderly woman, NAD HEENT: Unremarkable Neck:  No JVD, no thyromegally Lymphatics:  No adenopathy Back:  No CVA tenderness Lungs:  Clear HEART:  Regular brady rhythm, no murmurs, no rubs, no clicks Abd:  Soft, obese,  positive bowel sounds, no organomegally, no rebound, no guarding Ext:  2 plus pulses, no edema, no cyanosis, no clubbing Skin:  No rashes no nodules Neuro:  CN II through XII intact, motor grossly intact  ECG - NSR with poor R wave progression.   Assessment/Plan: 1. Symptomatic bradycardia 2. CAD 3. Mild LV dysfunction 4. Renal insufficiency  Rec: In light of multiple comorbidities, I would recommend proceeding with PPM followed by uptitration of her medical therapy for CAD and CHF. I have discussed the risks/benefits/goals/expectations of PPM with the patient and she wishes to proceed.  Sharlot Gowda TaylorMD 01/12/2012, 8:30  AM

## 2012-01-13 ENCOUNTER — Inpatient Hospital Stay (HOSPITAL_COMMUNITY): Payer: Medicare Other

## 2012-01-13 ENCOUNTER — Encounter: Payer: Self-pay | Admitting: *Deleted

## 2012-01-13 ENCOUNTER — Encounter (HOSPITAL_COMMUNITY): Payer: Self-pay | Admitting: *Deleted

## 2012-01-13 DIAGNOSIS — R55 Syncope and collapse: Secondary | ICD-10-CM | POA: Diagnosis present

## 2012-01-13 DIAGNOSIS — Z950 Presence of cardiac pacemaker: Secondary | ICD-10-CM | POA: Insufficient documentation

## 2012-01-13 DIAGNOSIS — R001 Bradycardia, unspecified: Secondary | ICD-10-CM

## 2012-01-13 DIAGNOSIS — I5022 Chronic systolic (congestive) heart failure: Secondary | ICD-10-CM | POA: Diagnosis present

## 2012-01-13 LAB — GLUCOSE, CAPILLARY
Glucose-Capillary: 143 mg/dL — ABNORMAL HIGH (ref 70–99)
Glucose-Capillary: 216 mg/dL — ABNORMAL HIGH (ref 70–99)
Glucose-Capillary: 160 mg/dL — ABNORMAL HIGH (ref 70–99)

## 2012-01-13 MED ORDER — HYDROCODONE-ACETAMINOPHEN 5-325 MG PO TABS
1.0000 | ORAL_TABLET | Freq: Four times a day (QID) | ORAL | Status: DC | PRN
  Administered 2012-01-13: 1 via ORAL
  Filled 2012-01-13: qty 1

## 2012-01-13 MED ORDER — HYDRALAZINE HCL 20 MG/ML IJ SOLN
10.0000 mg | Freq: Once | INTRAMUSCULAR | Status: AC
  Administered 2012-01-13: 02:00:00 10 mg via INTRAVENOUS
  Filled 2012-01-13: qty 0.5

## 2012-01-13 MED ORDER — YOU HAVE A PACEMAKER BOOK
Freq: Once | Status: AC
  Administered 2012-01-13: 12:00:00
  Filled 2012-01-13 (×2): qty 1

## 2012-01-13 MED ORDER — LABETALOL HCL 5 MG/ML IV SOLN
10.0000 mg | Freq: Once | INTRAVENOUS | Status: AC
  Administered 2012-01-13: 10 mg via INTRAVENOUS
  Filled 2012-01-13: qty 4

## 2012-01-13 NOTE — Progress Notes (Signed)
Dr. Charm Barges is notified about pt's elevated SBP, new orders received and followed.

## 2012-01-13 NOTE — Evaluation (Signed)
Physical Therapy Evaluation Patient Details Name: Brianna Chandler MRN: [id number] DOB: 01/19/1924 Today's Date: 01/13/2012 Time: 4098-1191 PT Time Calculation (min): 32 min  PT Assessment / Plan / Recommendation Clinical Impression  78 y/o WF admitted with bradycardia and s/p pacemaker placement.  Pt requiring MOD to MAX level of A at this time.  Recommend SNF for further rehab after d/c from acute care.  Pt verbalized need for further rehab, but would then say she doesn't want to go.  Pt appears concerned about who would take care of her daughter.  Deferred to SW.  Will follow acutely to work towards increasing her level of independence to work towards returning to [employer].    PT Assessment  Patient needs continued PT services    Follow Up Recommendations  Supervision/Assistance - 24 hour;Skilled nursing facility    Barriers to Discharge Decreased caregiver support Pt reports family can assist 1-2 days a week.  Husband at home?  Pt didn't mention husband, but unit secretary reports husband has visited.    Equipment Recommendations  Defer to next venue    Recommendations for Other Services     Frequency Min 3X/week    Precautions / Restrictions Precautions Precaution Comments: s/p pacemaker placement.  Sling to L arm.   Restrictions Other Position/Activity Restrictions: MD orders to minimize L arm movement- in sling.   Pertinent Vitals/Pain Surgical pain from pacemaker 6/10 based on FACE scale      Mobility  Bed Mobility Bed Mobility: Supine to Sit;Rolling Right;Rolling Left Rolling Right: 3: Mod assist Rolling Left: 3: Mod assist Supine to Sit: 2: Max assist Details for Bed Mobility Assistance: Pt requiring A to move B LE and then pt pulled up with R arm on PT. Transfers Transfers: Sit to Stand Sit to Stand: 3: Mod assist Ambulation/Gait Ambulation/Gait Assistance: 2: Max assist Ambulation Distance (Feet): 2 Feet Ambulation/Gait Assistance Details: Pt took 4 small side  shuffeling steps to the R with R UE bearing weight through RW.  PT manually moved RW to side 2* pt unable to use L UE.  Would benefit from hemi-walker or quad cane trial. Gait Pattern: Shuffle Gait velocity: slow    Exercises     PT Diagnosis: Difficulty walking;Generalized weakness  PT Problem List: Decreased strength;Decreased knowledge of use of DME;Decreased activity tolerance;Decreased mobility PT Treatment Interventions: DME instruction;Gait training;Functional mobility training;Therapeutic activities;Therapeutic exercise   PT Goals Acute Rehab PT Goals PT Goal Formulation: With patient Time For Goal Achievement: 01/27/12 Potential to Achieve Goals: Good Pt will go Supine/Side to Sit: with supervision PT Goal: Supine/Side to Sit - Progress: Goal set today Pt will go Sit to Supine/Side: with supervision PT Goal: Sit to Supine/Side - Progress: Goal set today Pt will go Sit to Stand: with supervision PT Goal: Sit to Stand - Progress: Goal set today Pt will go Stand to Sit: with supervision PT Goal: Stand to Sit - Progress: Goal set today Pt will Ambulate: 16 - 50 feet;with least restrictive assistive device;with min assist PT Goal: Ambulate - Progress: Goal set today  Visit Information  Last PT Received On: 01/13/12 Assistance Needed: +2    Subjective Data  Subjective: "I have this arm in a sling.  How am I going to walk?  I can't use a cane." Patient Stated Goal: Pt states she knows she needs more therapy, but wants to go home.   Prior Functioning  Home Living Lives With: Daughter (pt is caregiver for daughter who is bed bound.) Available Help at  Discharge: Family (family can help intermittently) Type of Home: House Home Access: Ramped entrance Home Layout: One level Home Adaptive Equipment: Walker - rolling Prior Function Level of Independence: Independent with assistive device(s)    Cognition  Overall Cognitive Status: Appears within functional limits for tasks  assessed/performed Arousal/Alertness: Awake/alert Orientation Level: Appears intact for tasks assessed Behavior During Session: [facility] for tasks performed    Extremity/Trunk Assessment Right Lower Extremity Assessment RLE ROM/Strength/Tone: Deficits RLE ROM/Strength/Tone Deficits: R LE with generalized 3+ to 4-/5 weakness Left Lower Extremity Assessment LLE ROM/Strength/Tone: Deficits LLE ROM/Strength/Tone Deficits: L LE with generalized 3+ to 4-/5 weakness   Balance    End of Session PT - End of Session Equipment Utilized During Treatment: Gait belt Activity Tolerance: Patient limited by pain Patient left: in bed;with call bell/phone within reach Nurse Communication: Mobility status  GP     Layani Foronda LUBECK 01/13/2012, 10:55 AM

## 2012-01-13 NOTE — Plan of Care (Signed)
Problem: Phase III Progression Outcomes Goal: Ambulating in room or hall Outcome: Not Met (add Reason) Pt consult will need rehab  Comments:  Pt consult done will need rehab

## 2012-01-13 NOTE — Op Note (Signed)
NAMEJOURNII, NIERMAN NO.:  [id number]  MEDICAL RECORD NO.:  [id number]  LOCATION:  6525                         FACILITY:  [facility]  PHYSICIAN:  Doylene Canning. Ladona Ridgel, MD    DATE OF BIRTH:  1923-09-10  DATE OF PROCEDURE:  01/12/2012 DATE OF DISCHARGE:                              OPERATIVE REPORT   PROCEDURE PERFORMED:  Insertion of dual-chamber pacemaker.  INDICATION:  Symptomatic bradycardia with heart rates in the low 30s and long pauses.  INTRODUCTION:  The patient is a very pleasant 78 year old woman with coronary disease, hypertension, and symptomatic bradycardia.  She developed worsening symptoms and was admitted to the hospital and transferred from [facility] in the [place].  She is now referred for permanent pacemaker insertion.  PROCEDURE:  After informed consent was obtained, the patient was taken to the diagnostic EP lab in a fasting state.  After usual preparation and draping, intravenous fentanyl and midazolam was given for sedation. 30 mL of lidocaine was infiltrated into the left infraclavicular region. A 5-cm incision was carried out over this region.  Electrocautery was utilized to dissect down to the fascial plane. The left subclavian vein was punctured x2 and the Medtronic model 5076, 52 cm, active fixation pacing lead, serial number [id number], was advanced into the right ventricle and a Medtronic model 5076, 45 cm, active fixation pacing lead, serial number [id number], was advanced to the right atrium. Mapping was carried out in the right ventricle.  At the final site, the R-waves were 11 mV.  The pacing threshold was 1.3 V at 0.5 msec.  There was a large injury current and the impedance was 1300 ohms initially. 10 V pacing did not stimulate the diaphragm.  With the ventricular lead in satisfactory position, attention then turned to placement of the atrial lead, which was placed in anterolateral portion of the right atrium where P-waves  measured 2.5 mV.  The pacing impedance was 500 ohms and threshold was 1.8 V at 0.5 msec.  Again, a prominent injury current was present and 10 V pacing did not stimulate the diaphragm.  With these satisfactory parameters, the leads were secured to the subpectoral fascia with a figure-of-eight silk suture.  The sewing sleeve was secured with silk suture.  Electrocautery was utilized to make subcutaneous pocket.  Antibiotic irrigation was utilized to irrigate the pocket and electrocautery was utilized to assure hemostasis.  The Medtronic Sensia dual-chamber pacemaker, serial number [id number], was connected to the atrial and ventricular leads and placed back in the subcutaneous pocket.  The pocket was again irrigated with antibiotic irrigation.  The generator was secured with silk suture to the fascial plane.  The incision was closed with 2-0 and 3-0 Vicryl.  Benzoin and Steri-Strips painted on the skin, a pressure dressing was applied, and the patient was returned to her room in satisfactory condition.  COMPLICATIONS:  There were no immediate procedure complications.  RESULTS:  This demonstrates successful implantation of a Medtronic dual- chamber pacemaker in a patient with symptomatic bradycardia.     Doylene Canning. Ladona Ridgel, MD     GWT/MEDQ  D:  01/12/2012  T:  01/13/2012  Job:  [id number]  cc:  Jonelle Sidle, MD

## 2012-01-13 NOTE — Progress Notes (Signed)
Patient Name: Brianna Chandler Date of Encounter: 01/13/2012   Principal Problem:  *Syncope Active Problems:  Symptomatic bradycardia  DM  OBESITY  HYPERTENSION  CORONARY ATHEROSCLEROSIS NATIVE CORONARY ARTERY  Chronic systolic CHF (congestive heart failure)   SUBJECTIVE  S/p PPM yesterday.  C/o mild chest wall soreness.  Hypertensive overnight (did not get home dose of bb 2/2 bradycardia prior to ppm placement).  CURRENT MEDS    . amLODipine  5 mg Oral Daily  . aspirin  325 mg Oral q morning - 10a  . atorvastatin  40 mg Oral QHS  . carvedilol  12.5 mg Oral BID WC  .  ceFAZolin (ANCEF) IV  2 g Intravenous Q6H  . Chlorhexidine Gluconate Cloth  6 each Topical Q0600  . diazepam  5 mg Oral On Call  . fentaNYL      . furosemide  20 mg Oral BID  . heparin      . hydrALAZINE  10 mg Intravenous Once  . influenza  inactive virus vaccine  0.5 mL Intramuscular Tomorrow-1000  . insulin aspart  0-5 Units Subcutaneous QHS  . insulin aspart  0-9 Units Subcutaneous TID WC  . labetalol  10 mg Intravenous Once  . lidocaine      . lisinopril  20 mg Oral q morning - 10a  . losartan  50 mg Oral q morning - 10a  . midazolam      . multivitamin with minerals  1 tablet Oral Daily  . mupirocin ointment  1 application Nasal BID  . pneumococcal 23 valent vaccine  0.5 mL Intramuscular Tomorrow-1000  . sodium chloride  3 mL Intravenous Q12H   OBJECTIVE  Filed Vitals:   01/13/12 0111 01/13/12 0118 01/13/12 0221 01/13/12 0339  BP:  188/59 191/52 150/58  Pulse:    69  Temp:    98 F (36.7 C)  TempSrc:    Oral  Resp:    24  Height:      Weight: 210 lb 1.6 oz (95.3 kg)     SpO2:    94%    Intake/Output Summary (Last 24 hours) at 01/13/12 0658 Last data filed at 01/13/12 0255  Gross per 24 hour  Intake  835.5 ml  Output   2675 ml  Net -1839.5 ml   Filed Weights   01/11/12 1635 01/13/12 0111  Weight: 208 lb 12.4 oz (94.7 kg) 210 lb 1.6 oz (95.3 kg)    PHYSICAL EXAM  General:  Pleasant, NAD. Neuro: Alert and oriented X 3. Moves all extremities spontaneously. Psych: Flat affect. HEENT:  Normal  Neck: Supple without bruits or JVD. Lungs:  Resp regular and unlabored, CTA. Heart: RRR no s3, s4, or murmurs.  Left upper chest wall site w/o bleeding/hematoma. Abdomen: Soft, non-tender, non-distended, BS + x 4.  Extremities: No clubbing, cyanosis or edema. DP/PT/Radials 2+ and equal bilaterally.  Accessory Clinical Findings  CBC  Basename 01/12/12 0454  WBC 12.1*  NEUTROABS --  HGB 12.8  HCT 39.6  MCV 89.2  PLT 162   Basic Metabolic Panel  Basename 01/12/12 0454 01/11/12 1747  NA 145 141  K 4.2 4.2  CL 111 108  CO2 26 23  GLUCOSE 151* 88  BUN 19 18  CREATININE 0.77 0.79  CALCIUM 10.2 10.2  MG -- 1.9  PHOS -- --   Liver Function Tests  Basename 01/12/12 0454  AST 12  ALT 11  ALKPHOS 70  BILITOT 0.4  PROT 5.9*  ALBUMIN 3.2*  Thyroid Function Tests  Basename 01/11/12 1747  TSH 1.794  T4TOTAL --  T3FREE --  THYROIDAB --    TELE  Rsr, a-pacing on demand, pac's, pvc's.  ECG  Rsr, 64, no acute st/t changes.  Radiology/Studies  CXR Pending this AM.  ASSESSMENT AND PLAN  1. Symptomatic bradycardia:  S/p PPM.  F/U cxr this AM.  Ambulate once more awake and consider d/c later this AM.  2.  CAD:  No chest pain.  Cont asa, plavix, bb (resume this AM), statin.  3.  Chronic Systolic CHF: volume appears to be stable.  Cont bb.  She is also on acei & arb - K/Creat nl yesterday.  4.  HTN:  bp up overnight.  Resume coreg this AM.  Cont ccb, acei/arb.   5.  DM:  [facility] ssi.  On metformin @ home - resume @ d/c.  Signed, Nicolasa Ducking NP  EP Attending  Patient seen and examined. Agree with above. Main issue now is ambulation and ability to be discharged to home. We have asked PT to assess prior to discharge as her nurses had a very difficult time getting her up and she was unable to stand on her own.  Lewayne Bunting, M.D.

## 2012-01-14 ENCOUNTER — Encounter (HOSPITAL_COMMUNITY): Payer: Self-pay | Admitting: Cardiology

## 2012-01-14 LAB — GLUCOSE, CAPILLARY
Glucose-Capillary: 136 mg/dL — ABNORMAL HIGH (ref 70–99)
Glucose-Capillary: 161 mg/dL — ABNORMAL HIGH (ref 70–99)

## 2012-01-14 NOTE — Clinical Social Work Psychosocial (Signed)
Clinical Social Work Department BRIEF PSYCHOSOCIAL ASSESSMENT 01/14/2012  Patient:  Brianna Chandler, Brianna Chandler     Account Number:  [id number]     Admit date:  01/11/2012  Clinical Social Worker:  Read Drivers  Date/Time:  01/14/2012 03:13 PM  Referred by:  RN  Date Referred:  01/14/2012 Referred for  SNF Placement   Other Referral:   none   Interview type:  Other - See comment Other interview type:   patient and daughter, Brianna Chandler    PSYCHOSOCIAL DATA Living Status:  FAMILY Admitted from facility:   Level of care:   Primary support name:  Brianna Chandler Primary support relationship to patient:  CHILD, ADULT Degree of support available:   emotional support- good; physical support-fair    CURRENT CONCERNS Current Concerns  Post-Acute Placement   Other Concerns:   none    SOCIAL WORK ASSESSMENT / PLAN CSW assessed pt at bedside.  Pt is on contact precautions for MRSA. Pt was reluctant to go to SNF.  Pt wanted HH. CSW explained to pt that she was a 2 person assist at best and asked if there was appropriate assistance at home.  Pt stated that she had a daughter at home.  Pt did not mention to me that her daughter was bed bound. (dtr stated this to me in a phone conversation).  By the end of the conversation, pt was agreeable to SNF and chose the [facility].  CSW confirmed this with the SNF.  CSW called Brianna Chandler, the pt daughter to discuss d/c plans with her.  Pt husband also lives at home; however Brianna Chandler stated that dad was demented and is constantly confused and stumbling.  Pt, husband and daughter all "take care of each other".   Assessment/plan status:  Psychosocial Support/Ongoing Assessment of Needs Other assessment/ plan:   none   Information/referral to community resources:   SNF    PATIENT'S/FAMILY'S RESPONSE TO PLAN OF CARE: appreciative of CSW assistance        Vickii Penna, [place] [phone]  Clinical Social Work

## 2012-01-14 NOTE — Progress Notes (Signed)
Physical Therapy Treatment Patient Details Name: Brianna Chandler MRN: [id number] DOB: 06/14/1923 Today's Date: 01/14/2012 Time: 4098-1191 PT Time Calculation (min): 15 min  PT Assessment / Plan / Recommendation Comments on Treatment Session  pt presents with Pacer.  pt moving better today and motivated to mobilize.  pt still notes wanting to go home, but understanding that she needs further therapies before returning home.      Follow Up Recommendations  Skilled nursing facility    Barriers to Discharge        Equipment Recommendations  Defer to next venue    Recommendations for Other Services    Frequency Min 3X/week   Plan Discharge plan remains appropriate;Frequency remains appropriate    Precautions / Restrictions Precautions Precautions: ICD/Pacemaker Restrictions Weight Bearing Restrictions: No   Pertinent Vitals/Pain Pt indicates L shoulder aches, but when asked to rate states "not that bad".      Mobility  Bed Mobility Bed Mobility: Not assessed Transfers Transfers: Sit to Stand;Stand to Sit Sit to Stand: 1: +2 Total assist;With upper extremity assist;From chair/3-in-1;With armrests Sit to Stand: Patient Percentage: 60% Stand to Sit: 1: +2 Total assist;With upper extremity assist;To chair/3-in-1;With armrests Details for Transfer Assistance: cues for use of R UE on armrests and to use L UE, but minimize WBing.   Ambulation/Gait Ambulation/Gait Assistance: 4: Min assist Ambulation Distance (Feet): 15 Feet Assistive device: Rolling walker Ambulation/Gait Assistance Details: Cues for use of RW, positioning in RW, upright posture, encouragement.  +2 for chair follow.   Gait Pattern: Step-through pattern;Decreased stride length;Shuffle;Trunk flexed Stairs: No Wheelchair Mobility Wheelchair Mobility: No    Exercises     PT Diagnosis:    PT Problem List:   PT Treatment Interventions:     PT Goals Acute Rehab PT Goals Time For Goal Achievement: 01/27/12 PT  Goal: Sit to Stand - Progress: Progressing toward goal PT Goal: Stand to Sit - Progress: Progressing toward goal PT Goal: Ambulate - Progress: Progressing toward goal  Visit Information  Last PT Received On: 01/14/12 Assistance Needed: +2    Subjective Data  Subjective: That shoulder is just tender today.     Cognition  Overall Cognitive Status: Appears within functional limits for tasks assessed/performed Arousal/Alertness: Awake/alert Orientation Level: Appears intact for tasks assessed Behavior During Session: [facility] for tasks performed    Balance  Balance Balance Assessed: No  End of Session PT - End of Session Equipment Utilized During Treatment: Gait belt Activity Tolerance: Patient limited by fatigue Patient left: in chair;with call bell/phone within reach Nurse Communication: Mobility status   GP     Sunny Schlein, [place] [phone] 01/14/2012, 11:43 AM

## 2012-01-14 NOTE — Progress Notes (Signed)
CSW called [facility] to make aware pt has been d/c and transport has been called.  Informed SNF of pt testing positive for MRSA. Vickii Penna, LCSWA [phone]  Clinical Social Work

## 2012-01-14 NOTE — Clinical Social Work Placement (Signed)
Clinical Social Work Department CLINICAL SOCIAL WORK PLACEMENT NOTE 01/14/2012  Patient:  Brianna Chandler, Brianna Chandler  Account Number:  [id number] Admit date:  01/11/2012  Clinical Social Worker:  Read Drivers  Date/time:  01/14/2012 03:38 PM  Clinical Social Work is seeking post-discharge placement for this patient at the following level of care:   SKILLED NURSING   (*CSW will update this form in Epic as items are completed)   01/14/2012  Patient/family provided with [facility] Department of Clinical Social Work's list of facilities offering this level of care within the geographic area requested by the patient (or if unable, by the patient's family).  01/14/2012  Patient/family informed of their freedom to choose among providers that offer the needed level of care, that participate in Medicare, Medicaid or managed care program needed by the patient, have an available bed and are willing to accept the patient.  01/14/2012  Patient/family informed of [facility]' ownership interest in [facility], as well as of the fact that they are under no obligation to receive care at this facility.  PASARR submitted to EDS on 01/14/2012 PASARR number received from EDS on   FL2 transmitted to all facilities in geographic area requested by pt/family on  01/14/2012 FL2 transmitted to all facilities within larger geographic area on   Patient informed that his/her managed care company has contracts with or will negotiate with  certain facilities, including the following:     Patient/family informed of bed offers received:  01/14/2012 Patient chooses bed at [facility] [place] Physician recommends and patient chooses bed at    Patient to be transferred to [facility] on  01/14/2012 Patient to be transferred to facility by EMS  The following physician request were entered in Epic:   Additional Comments: Pt being placed at SNF of choice.  Vickii Penna, LCSWA [phone]  Clinical Social Work

## 2012-01-14 NOTE — Discharge Summary (Signed)
Discharge Summary   Patient ID: Brianna Chandler MRN: [id number], DOB/AGE: 10-13-1923 78 y.o.  Primary MD: Louie Boston, MD Primary Cardiologist: [facility] in [place]/Dr. Ladona Ridgel (EP) Admit date: 01/11/2012 D/C date:     01/14/2012      Primary Discharge Diagnoses:  1. Syncope/Symptomatic bradycardia  - s/p Medtronic dual-chamber PPM 01/12/12  2. Deconditioning  - Dc to SNF  Secondary Discharge Diagnoses:  . Diastolic CHF     echo 12/2011 EF 60-65%, no RWMAs, grade 2 diastolic dysfunction, mild MR, mild-mod LAE, PASP  . DM (diabetes mellitus)   . Hypertension   . Osteoarthritis     Unspec whether gen/loc unspec site  . Obesity   . Renal insufficiency   . CAD (coronary artery disease) February 05, 2009    NSTEMI s/p DES prox LAD, nl LV fuction after sent placement    Allergies No Known Allergies  Diagnostic Studies/Procedures:   01/12/12 - Pacemaker Implantation - Medtronic model 5076, 52 cm, active fixation pacing lead, serial number [id number], was advanced into the RV - Medtronic model 5076, 45 cm, active fixation pacing lead, serial number [id number], was advanced to the RA - Medtronic Sensia dual-chamber pacemaker, serial number [id number]  01/12/12 - Echo Study Conclusions: - Left ventricle: The cavity size was normal. Wall thickness was increased in a pattern of mild LVH. Systolic function was normal. The estimated ejection fraction was in the range of 60% to 65%. Wall motion was normal; there were no regional wall motion abnormalities. Features are consistent with a pseudonormal left ventricular filling pattern, with concomitant abnormal relaxation and increased filling pressure (grade 2 diastolic dysfunction). - Aortic valve: There was no stenosis. - Mitral valve: Mildly calcified annulus. Mild regurgitation. - Left atrium: The atrium was mildly to moderately dilated. - Right ventricle: The cavity size was normal. Systolic function was normal. - Pulmonary  arteries: PA peak pressure: 54mm Hg (S). - Inferior vena cava: The vessel was normal in size; the respirophasic diameter changes were in the normal range (=50%); findings are consistent with normal central venous pressure. - Pericardium, extracardiac: A trivial pericardial effusion was identified posterior to the heart.  Impressions: Normal LV size with mild LV hypertrophy. EF 60-65%. Moderate diastolic dysfunction. Normal RV size and systolic function. Moderate pulmonary hypertension.   History of Present Illness: 78 y.o. female w/ the above medical problems who transferred from [facility] to [facility] on 01/11/12 for pacemaker implantation in the setting of syncope and symptomatic bradycardia.  Hospital Course: At [facility] EKG revealedjunctional bradycardia at 36 BPM with LBBB. CXR showed enlargement of the cardiac silhouette, minimal right basilar atelectasis. CT of the head showed no acute findings. CT angio showed mild cardiac enlargement, no PE, moderate atherosclerosis of the aorta.Labs were significant for normal cardiac enzymes, DDImer 0.77, BNP 314. She was transferred to [facility] for further evaluation and possible pacemaker by EP.  Upon arrival she was in stable condition, but with markedly elevated BP for which IV hydralazine was given. Echo revealed EF 60-65%, no RWMAs, grade 2 diastolic dysfunction, mild MR, mild-mod LAE, PASP . She underwent placement of Medtronic dual-chamber pacemaker on 01/13/12. She tolerated the procedure well without complications. Postop CXR was without pneumothorax or other acute findings. Postoperatively she had difficulty with strength and ambulation for which PT evaluated her and felt she would benefit from SNF which was arranged by case management. She was seen and evaluated by Dr. Ladona Ridgel who felt she was stable for discharge to  SNF with plans for follow up as scheduled below.  Note: Patient was on Lisinopril and Losartan upon  admission. These were also on her medication list at her last clinic visit. She will be dc'd on the same medications with plans for adjustment at her next outpatient visit.   Discharge Vitals: Blood pressure 157/69, pulse 59, temperature 98 F (36.7 C), temperature source Oral, resp. rate 18, height 5\' 2"  (1.575 m), weight 209 lb 3.5 oz (94.9 kg), SpO2 93.00%.  Labs: Component Value Date   WBC 12.1* 01/12/2012   HGB 12.8 01/12/2012   HCT 39.6 01/12/2012   MCV 89.2 01/12/2012   PLT 162 01/12/2012    Lab 01/12/12 0454  NA 145  K 4.2  CL 111  CO2 26  BUN 19  CREATININE 0.77  CALCIUM 10.2  PROT 5.9*  BILITOT 0.4  ALKPHOS 70  ALT 11  AST 12  GLUCOSE 151*     Discharge Medications     Medication List     As of 01/14/2012  2:52 PM    TAKE these medications         amLODipine 5 MG tablet   Commonly known as: NORVASC   Take 1 tablet (5 mg total) by mouth daily.      aspirin 325 MG tablet   Take 325 mg by mouth every morning.      atorvastatin 40 MG tablet   Commonly known as: LIPITOR   Take 1 tablet (40 mg total) by mouth at bedtime.      carvedilol 12.5 MG tablet   Commonly known as: COREG   Take 12.5 mg by mouth 2 (two) times daily with a meal.      clopidogrel 75 MG tablet   Commonly known as: PLAVIX   Take 1 tablet (75 mg total) by mouth daily.      furosemide 20 MG tablet   Commonly known as: LASIX   Take 20 mg by mouth 2 (two) times daily.      HYDROcodone-acetaminophen 5-325 MG per tablet   Commonly known as: NORCO/VICODIN   Take 1 tablet by mouth every 4 (four) hours as needed. For pain      lisinopril 20 MG tablet   Commonly known as: PRINIVIL,ZESTRIL   Take 20 mg by mouth every morning.      losartan 50 MG tablet   Commonly known as: COZAAR   Take 50 mg by mouth every morning.      metFORMIN 500 MG (MOD) 24 hr tablet   Commonly known as: GLUMETZA   Take 500 mg by mouth 2 (two) times daily with a meal.      multivitamin tablet   Take 1 tablet  by mouth daily.      nitroGLYCERIN 0.4 MG SL tablet   Commonly known as: NITROSTAT   Place 0.4 mg under the tongue every 5 (five) minutes x 3 doses as needed.      TYLENOL 325 MG tablet   Generic drug: acetaminophen   Take 650 mg by mouth every 4 (four) hours as needed. For pain         Disposition   Discharge Orders    Future Appointments: Provider: Department: Dept Phone: Center:   01/24/2012 4:30 PM [facility] [phone] [email or web address]   01/28/2012 1:15 PM Prescott Parma, PA [facility] [phone] [email or web address]     Future Orders Please Complete By Expires   Diet - low sodium heart healthy  Increase activity slowly      Discharge instructions        Follow-up Information    Follow up with [facility]. On 01/18/2012. (Our device staff will come to your facility for a wound/device check)    Contact information:   [address] [place] [address]       Follow up with Lewayne Bunting, MD. In 3 months. (Our office will call you with your appointment date/time)    Contact information:   [facility] [address] [place] [address] [phone]       Follow up with Prescott Parma, PA. On 01/28/2012. (1:15)    Contact information:   [facility] [address] [place] [address] [phone]           Outstanding Labs/Studies:  None  Duration of Discharge Encounter: Greater than 30 minutes including physician and PA time.  Signed, Mareo Portilla PA-C 01/14/2012, 2:52 PM

## 2012-01-14 NOTE — Progress Notes (Signed)
CSW assessed pt at bedside.  Full assessment to follow.  PT and MD recommend short term rehab (SNF) before returning home.  Pt agreeable.  CSW called Clydie Braun, pt daughter, to go over d/c plans for pt per pt request.  Pt chose bed at [facility] in [place].  CSW called [facility] to confirm plans for d/c.  SNF confirmed plans.  CSW will arrange transportation and facilitate d/c.  FL2 on chart and needs signature. Vickii Penna, LCSWA [phone]  Clinical Social Work

## 2012-01-14 NOTE — Progress Notes (Signed)
   ELECTROPHYSIOLOGY ROUNDING NOTE    Patient Name: Brianna Chandler Date of Encounter: 01-14-2012    SUBJECTIVE:Patient with incisional soreness.  Very weak and unsteady.  S/p PPM implant 01-12-2012.  PT evaluated yesterday and recommended rehab.  Pt reluctant to go because of home situation, but admits that she is unable to care for herself at this time.  She is requesting SNF in [place].   TELEMETRY: Reviewed telemetry pt in sinus rhythm Filed Vitals:   01/13/12 1346 01/13/12 1726 01/13/12 2033 01/14/12 0500  BP: 167/68 151/70 148/67 158/77  Pulse: 70 68 64 68  Temp: 98.1 F (36.7 C)  97.5 F (36.4 C) 98 F (36.7 C)  TempSrc:      Resp: 22  20 16   Height:      Weight:    209 lb 3.5 oz (94.9 kg)  SpO2: 97%  90% 95%    Intake/Output Summary (Last 24 hours) at 01/14/12 0802 Last data filed at 01/13/12 1737  Gross per 24 hour  Intake    240 ml  Output    350 ml  Net   -110 ml    PHYSICAL EXAM Left chest with minimal ecchymosis  Assessment/Plan 1. Symptomatic brady 2. S/p PPM 3. Severe weakness Rec: ok to proceed with SNF. We will see her back in several weeks in the office.  Lewayne Bunting, M.D.

## 2012-01-18 ENCOUNTER — Other Ambulatory Visit: Payer: Self-pay | Admitting: *Deleted

## 2012-01-18 MED ORDER — CLOPIDOGREL BISULFATE 75 MG PO TABS: 75.0000 mg | ORAL_TABLET | Freq: Every day | ORAL | Status: DC

## 2012-01-19 ENCOUNTER — Ambulatory Visit: Payer: Medicare Other | Admitting: *Deleted

## 2012-01-19 DIAGNOSIS — R001 Bradycardia, unspecified: Secondary | ICD-10-CM

## 2012-01-24 ENCOUNTER — Ambulatory Visit: Payer: Medicare Other

## 2012-01-24 LAB — PACEMAKER DEVICE OBSERVATION
AL IMPEDENCE PM: 455 Ohm
AL THRESHOLD: 0.75 V
RV LEAD AMPLITUDE: 15.68 mv
RV LEAD IMPEDENCE PM: 893 Ohm

## 2012-01-24 NOTE — Progress Notes (Signed)
Wound check pacer in clinic  

## 2012-01-28 ENCOUNTER — Encounter: Payer: Self-pay | Admitting: Physician Assistant

## 2012-01-28 ENCOUNTER — Ambulatory Visit (INDEPENDENT_AMBULATORY_CARE_PROVIDER_SITE_OTHER): Payer: Medicare Other | Admitting: Physician Assistant

## 2012-01-28 VITALS — BP 154/65 | HR 61 | Ht 62.0 in | Wt 217.1 lb

## 2012-01-28 DIAGNOSIS — I509 Heart failure, unspecified: Secondary | ICD-10-CM

## 2012-01-28 DIAGNOSIS — R001 Bradycardia, unspecified: Secondary | ICD-10-CM

## 2012-01-28 DIAGNOSIS — I2510 Atherosclerotic heart disease of native coronary artery without angina pectoris: Secondary | ICD-10-CM

## 2012-01-28 DIAGNOSIS — Z950 Presence of cardiac pacemaker: Secondary | ICD-10-CM

## 2012-01-28 DIAGNOSIS — E119 Type 2 diabetes mellitus without complications: Secondary | ICD-10-CM

## 2012-01-28 DIAGNOSIS — I10 Essential (primary) hypertension: Secondary | ICD-10-CM

## 2012-01-28 DIAGNOSIS — E785 Hyperlipidemia, unspecified: Secondary | ICD-10-CM

## 2012-01-28 DIAGNOSIS — I5022 Chronic systolic (congestive) heart failure: Secondary | ICD-10-CM

## 2012-01-28 MED ORDER — ASPIRIN EC 81 MG PO TBEC: 81.0000 mg | DELAYED_RELEASE_TABLET | Freq: Every day | ORAL | Status: DC

## 2012-01-28 MED ORDER — CARVEDILOL 12.5 MG PO TABS: 18.7500 mg | ORAL_TABLET | Freq: Two times a day (BID) | ORAL | Status: DC

## 2012-01-28 NOTE — Progress Notes (Addendum)
Primary Cardiologist: Lewayne Bunting, MD   HPI: Post hospital followup from [facility] status post Medtronic dual-chamber PPM, 9/18, for treatment of syncope/symptomatic bradycardia, following initial presentation to [facility]. She presented with heart rate in the 30 bpm range, and was seen in consultation by Dr. Diona Browner. Coreg was placed on hold initially, but heart rate remained in the high 30s. Consequently, recommendation was to transfer for PPM implantation.  Patient presented with known history of CAD. Serial cardiac markers were normal. TSH was not drawn. CTA of chest negative for PE.  Following transfer, an echocardiogram was obtained:   -2-D echo: EF 60-65%, grade 2 diastolic dysfunction; no focal WMAs; NL RVF; moderate PHTN; mild MR  A wound/device check was performed on September 30, at the [facility]. This yielded normal device function, with no mode switches or high ventricular rates.  Clinically, patient reports no recurrent near syncope symptoms. She denies symptoms suggestive of UAP or CHF.   12-lead EKG today, reviewed by me, indicates atrial pacing with ventricular sensing at 60 bpm   No Known Allergies  Current Outpatient Prescriptions  Medication Sig Dispense Refill  . acetaminophen (TYLENOL) 325 MG tablet Take 650 mg by mouth every 4 (four) hours as needed. For pain      . amLODipine (NORVASC) 5 MG tablet Take 1 tablet (5 mg total) by mouth daily.  30 tablet  3  . aspirin 325 MG tablet Take 325 mg by mouth every morning.       Marland Kitchen atorvastatin (LIPITOR) 40 MG tablet Take 1 tablet (40 mg total) by mouth at bedtime.  30 tablet  3  . carvedilol (COREG) 12.5 MG tablet Take 12.5 mg by mouth 2 (two) times daily with a meal.      . clopidogrel (PLAVIX) 75 MG tablet Take 1 tablet (75 mg total) by mouth daily.  30 tablet  6  . furosemide (LASIX) 20 MG tablet Take 20 mg by mouth 2 (two) times daily.       Marland Kitchen HYDROcodone-acetaminophen (NORCO) 5-325 MG per tablet Take 1 tablet by mouth every 4  (four) hours as needed. For pain      . lisinopril (PRINIVIL,ZESTRIL) 20 MG tablet Take 20 mg by mouth every morning.       Marland Kitchen losartan (COZAAR) 50 MG tablet Take 50 mg by mouth every morning.       . metFORMIN (GLUMETZA) 500 MG (MOD) 24 hr tablet Take 500 mg by mouth 2 (two) times daily with a meal.      . Multiple Vitamin (MULTIVITAMIN) tablet Take 1 tablet by mouth daily.        . nitroGLYCERIN (NITROSTAT) 0.4 MG SL tablet Place 0.4 mg under the tongue every 5 (five) minutes x 3 doses as needed.         Past Medical History  Diagnosis Date  . Diastolic CHF     echo 12/2011 EF 60-65%, no RWMAs, grade 2 diastolic dysfunction, mild MR, mild-mod LAE, PASP  . DM (diabetes mellitus)   . Hypertension   . Osteoarthritis     Unspec whether gen/loc unspec site  . Obesity   . Renal insufficiency   . CAD (coronary artery disease) February 05, 2009    NSTEMI s/p DES prox LAD, nl LV fuction after sent placement  . Symptomatic bradycardia     a. 12/2011 s/p MDT Jana Half DC PPM, [id number] H  . Syncope     Past Surgical History  Procedure Date  . Total abdominal hysterectomy   .  Cataract extraction     History   Social History  . Marital Status: Married    Spouse Name: N/A    Number of Children: N/A  . Years of Education: N/A   Occupational History  . Retired    Social History Main Topics  . Smoking status: Former Games developer  . Smokeless tobacco: Former Neurosurgeon    Types: Snuff    Quit date: 04/26/1990  . Alcohol Use: No  . Drug Use: No  . Sexually Active: Not on file   Other Topics Concern  . Not on file   Social History Narrative  . No narrative on file   Social History Narrative  . No narrative on file    Problem Relation Age of Onset  . Diabetes    . Hypertension    . Coronary artery disease      ROS: no nausea, vomiting; no fever, chills; no melena, hematochezia; no claudication  PHYSICAL EXAM: BP 154/65  Pulse 61  Ht 5\' 2"  (1.575 m)  Wt 217 lb 1.9 oz (98.485  kg)  BMI 39.71 kg/m2 GENERAL: 78 year old female, morbidly obese; NAD HEENT: NCAT, PERRLA, EOMI; sclera clear; no xanthelasma NECK: palpable bilateral carotid pulses, no bruits; no JVD; no TM LUNGS: CTA bilaterally CARDIAC: RRR (S1, S2); no significant murmurs; no rubs or gallops ABDOMEN: Protuberant EXTREMETIES: 2+ bilateral, nonpitting peripheral edema SKIN: warm/dry; pacer site intact with well-healed incision, no swelling, minimal residual ecchymosis MUSCULOSKELETAL: no joint deformity NEURO: no focal deficit; NL affect   EKG: reviewed and available in Electronic Records   ASSESSMENT & PLAN:  Pacemaker-Medtronic Patient to follow with Dr. Rosette Reveal in 3 months, as recommended.  CORONARY ATHEROSCLEROSIS NATIVE CORONARY ARTERY Quiescent on current medication regimen. Normal cardiac markers during recent hospitalization. Will decrease ASA to 81 mg daily, given that patient is on concomitant Plavix therapy.  Chronic diastolic CHF (congestive heart failure) Euvolemic by history and exam. Continue current diuretic regimen with close monitoring of electrolytes and renal function.  DM Followed by primary M.D.  HLD (hyperlipidemia) Well-controlled with recent LDL 61, on current dose Lipitor.   HYPERTENSION Currently not adequately controlled. In light of persistent LE edema, I will discontinue Norvasc and increase carvedilol to 18.75 twice a day. Recommend further up titration as BP allows, with target 25 twice a day.    Gene Garl Speigner, PAC

## 2012-01-28 NOTE — Assessment & Plan Note (Signed)
Currently not adequately controlled. In light of persistent LE edema, I will discontinue Norvasc and increase carvedilol to 18.75 twice a day. Recommend further up titration as BP allows, with target 25 twice a day.

## 2012-01-28 NOTE — Assessment & Plan Note (Signed)
Patient to follow with Dr. Rosette Reveal in 3 months, as recommended.

## 2012-01-28 NOTE — Assessment & Plan Note (Signed)
Well-controlled with recent LDL 61, on current dose Lipitor.

## 2012-01-28 NOTE — Assessment & Plan Note (Signed)
Quiescent on current medication regimen. Normal cardiac markers during recent hospitalization. Will decrease ASA to 81 mg daily, given that patient is on concomitant Plavix therapy.

## 2012-01-28 NOTE — Patient Instructions (Signed)
   Decrease Aspirin to 81mg  daily  Stop Norvasc (Amlodipine)  Increase Coreg to 18.75mg  twice a day   Continue all other current medications.  Your physician wants you to follow up in: 6 months.  You will receive a reminder letter in the mail one-two months in advance.  If you don't receive a letter, please call our office to schedule the follow up appointment  Follow up with Dr. Ladona Ridgel in 3 months

## 2012-01-28 NOTE — Assessment & Plan Note (Signed)
Euvolemic by history and exam. Continue current diuretic regimen with close monitoring of electrolytes and renal function.

## 2012-01-28 NOTE — Assessment & Plan Note (Signed)
Followed by primary M.D. 

## 2012-02-08 ENCOUNTER — Encounter: Payer: Self-pay | Admitting: Internal Medicine

## 2012-05-01 ENCOUNTER — Telehealth: Payer: Self-pay | Admitting: Internal Medicine

## 2012-05-01 ENCOUNTER — Encounter: Payer: Medicare Other | Admitting: Internal Medicine

## 2012-05-01 NOTE — Telephone Encounter (Signed)
Patient no showed appointment.  Has no working telephone number.  Letter mailed. / tgs

## 2012-05-11 ENCOUNTER — Telehealth: Payer: Self-pay | Admitting: *Deleted

## 2012-05-11 DIAGNOSIS — R079 Chest pain, unspecified: Secondary | ICD-10-CM

## 2012-05-11 NOTE — Telephone Encounter (Signed)
Pt daughter called into advise that pt noted increased HR and pale last night took nitro times 2, also noted chest pain last night as well and soreness under her pacemaker place around 9pm last night, after took the nitro the pain eased up as well as the soreness, notes feels better today however, had pt take BP while on phone and noted 194/85 HR 67, daughter advised pt is still pale in color, noted daughter will take pt to the ED for evaluation

## 2012-05-12 DIAGNOSIS — R079 Chest pain, unspecified: Secondary | ICD-10-CM

## 2012-05-23 ENCOUNTER — Encounter: Payer: Self-pay | Admitting: *Deleted

## 2012-05-23 DIAGNOSIS — R079 Chest pain, unspecified: Secondary | ICD-10-CM

## 2012-05-31 ENCOUNTER — Ambulatory Visit (INDEPENDENT_AMBULATORY_CARE_PROVIDER_SITE_OTHER): Payer: Medicare PPO | Admitting: Physician Assistant

## 2012-05-31 ENCOUNTER — Encounter: Payer: Self-pay | Admitting: Physician Assistant

## 2012-05-31 VITALS — BP 147/76 | HR 63 | Ht 62.0 in | Wt 205.1 lb

## 2012-05-31 DIAGNOSIS — E785 Hyperlipidemia, unspecified: Secondary | ICD-10-CM

## 2012-05-31 DIAGNOSIS — I2510 Atherosclerotic heart disease of native coronary artery without angina pectoris: Secondary | ICD-10-CM

## 2012-05-31 DIAGNOSIS — I10 Essential (primary) hypertension: Secondary | ICD-10-CM

## 2012-05-31 DIAGNOSIS — I5032 Chronic diastolic (congestive) heart failure: Secondary | ICD-10-CM | POA: Insufficient documentation

## 2012-05-31 DIAGNOSIS — Z950 Presence of cardiac pacemaker: Secondary | ICD-10-CM

## 2012-05-31 NOTE — Assessment & Plan Note (Signed)
Euvolemic by history and exam. Continue current diuretic regimen. 

## 2012-05-31 NOTE — Assessment & Plan Note (Signed)
Well-controlled by most recent assessment, 08/2011 (LDL 40). Followed by primary M.D.

## 2012-05-31 NOTE — Assessment & Plan Note (Signed)
Followed by Dr. Sharrell Ku

## 2012-05-31 NOTE — Progress Notes (Signed)
Primary Cardiologist: Simona Huh, MD (new)   HPI: Post hospital followup from [facility], status post 2 recent hospitalizations with CP.   She was referred to [place] on both occasions, each time ruling out for MI with normal troponins. The day prior to her scheduled outpatient Cardiolite, which we had recommended during her initial visit, she returned to the ED with recurrent CP. Again troponins were normal. We proceeded with in-house evaluation with a Lexiscan Cardiolite, results as follows:   - LexIscan Cardiolite, January 28: Possible small/medium sized inferior infarct with mild peri-infarct ischemia; EF 50%. Abnormal, but overall low risk (suboptimal) study  We recommended adding Imdur to her medication regimen.  Clinically, she reports feeling much "better". She has not had any recurrent anterior chest discomfort. She has not had to use any NTG.  No Known Allergies  Current Outpatient Prescriptions  Medication Sig Dispense Refill  . amLODipine (NORVASC) 5 MG tablet Take 5 mg by mouth daily.      Marland Kitchen aspirin EC 81 MG tablet Take 1 tablet (81 mg total) by mouth daily.  90 tablet  3  . atorvastatin (LIPITOR) 40 MG tablet Take 1 tablet (40 mg total) by mouth at bedtime.  30 tablet  3  . carvedilol (COREG) 12.5 MG tablet Take 12.5 mg by mouth 2 (two) times daily with a meal.      . clopidogrel (PLAVIX) 75 MG tablet Take 1 tablet (75 mg total) by mouth daily.  30 tablet  6  . furosemide (LASIX) 20 MG tablet Take 20 mg by mouth 2 (two) times daily.       . isosorbide mononitrate (IMDUR) 30 MG 24 hr tablet Take 30 mg by mouth daily.      Marland Kitchen lisinopril (PRINIVIL,ZESTRIL) 20 MG tablet Take 20 mg by mouth every morning.       Marland Kitchen losartan (COZAAR) 50 MG tablet Take 50 mg by mouth every morning.       . metFORMIN (GLUMETZA) 500 MG (MOD) 24 hr tablet Take 500 mg by mouth 2 (two) times daily with a meal.      . Multiple Vitamin (MULTIVITAMIN) tablet Take 1 tablet by mouth daily.        . nitroGLYCERIN  (NITROSTAT) 0.4 MG SL tablet Place 0.4 mg under the tongue every 5 (five) minutes x 3 doses as needed.       Marland Kitchen HYDROcodone-acetaminophen (NORCO) 5-325 MG per tablet Take 1 tablet by mouth every 4 (four) hours as needed. For pain        Past Medical History  Diagnosis Date  . Diastolic CHF     echo 12/2011 EF 60-65%, no RWMAs, grade 2 diastolic dysfunction, mild MR, mild-mod LAE, PASP  . DM (diabetes mellitus)   . Hypertension   . Osteoarthritis     Unspec whether gen/loc unspec site  . Obesity   . Renal insufficiency   . CAD (coronary artery disease) February 05, 2009    NSTEMI s/p DES prox LAD, nl LV fuction after sent placement  . Symptomatic bradycardia     a. 12/2011 s/p MDT Jana Half DC PPM, [id number] H  . Syncope     Past Surgical History  Procedure Date  . Total abdominal hysterectomy   . Cataract extraction     History   Social History  . Marital Status: Married    Spouse Name: N/A    Number of Children: N/A  . Years of Education: N/A   Occupational History  . Retired  Social History Main Topics  . Smoking status: Never Smoker   . Smokeless tobacco: Former Neurosurgeon    Types: Snuff    Quit date: 04/26/1990  . Alcohol Use: No  . Drug Use: No  . Sexually Active: Not on file   Other Topics Concern  . Not on file   Social History Narrative  . No narrative on file   Social History Narrative  . No narrative on file    Problem Relation Age of Onset  . Diabetes    . Hypertension    . Coronary artery disease      ROS: no nausea, vomiting; no fever, chills; no melena, hematochezia; no claudication  PHYSICAL EXAM: BP 147/76  Pulse 63  Ht 5\' 2"  (1.575 m)  Wt 205 lb 1.9 oz (93.042 kg)  BMI 37.52 kg/m2  SpO2 95% GENERAL: 79 year old female, morbidly obese; NAD HEENT: NCAT, PERRLA, EOMI; sclera clear; no xanthelasma NECK: palpable bilateral carotid pulses, no bruits; no JVD; no TM LUNGS: CTA bilaterally CARDIAC: RRR (S1, S2); no significant murmurs; no  rubs or gallops ABDOMEN: Protuberant EXTREMETIES: no significant peripheral edema SKIN: warm/dry; no obvious rash/lesions MUSCULOSKELETAL: no joint deformity NEURO: no focal deficit; NL affect   EKG:    ASSESSMENT & PLAN:  CORONARY ATHEROSCLEROSIS NATIVE CORONARY ARTERY Patient reports no recurrent CP, following our addition Imdur. Therefore, we will continue her current regimen and reassess her clinical status in 3 months, with Dr. Diona Browner.  HLD (hyperlipidemia) Well-controlled by most recent assessment, 08/2011 (LDL 40). Followed by primary M.D.  HYPERTENSION Stable on current medication regimen, followed by primary M.D.  Pacemaker-Medtronic Followed by Dr. Sharrell Ku  Chronic diastolic heart failure Euvolemic by history and exam. Continue current diuretic regimen.    Gene Tinley Rought, PAC

## 2012-05-31 NOTE — Patient Instructions (Addendum)
Continue all current medications. Follow up in  3 months 

## 2012-05-31 NOTE — Assessment & Plan Note (Signed)
Patient reports no recurrent CP, following our addition Imdur. Therefore, we will continue her current regimen and reassess her clinical status in 3 months, with Dr. Diona Browner.

## 2012-05-31 NOTE — Assessment & Plan Note (Signed)
Stable on current medication regimen, followed by primary M.D. 

## 2012-06-12 ENCOUNTER — Encounter: Payer: Medicare Other | Admitting: Internal Medicine

## 2012-06-20 ENCOUNTER — Ambulatory Visit (INDEPENDENT_AMBULATORY_CARE_PROVIDER_SITE_OTHER): Payer: Medicare PPO | Admitting: Internal Medicine

## 2012-06-20 ENCOUNTER — Encounter: Payer: Self-pay | Admitting: Internal Medicine

## 2012-06-20 VITALS — BP 173/72 | HR 66 | Ht 62.0 in | Wt 209.0 lb

## 2012-06-20 DIAGNOSIS — Z950 Presence of cardiac pacemaker: Secondary | ICD-10-CM

## 2012-06-20 LAB — PACEMAKER DEVICE OBSERVATION
AL IMPEDENCE PM: 407 Ohm
AL THRESHOLD: 1 V
ATRIAL PACING PM: 73
BAMS-0001: 150 {beats}/min
BATTERY VOLTAGE: 2.8 V

## 2012-06-20 MED ORDER — CARVEDILOL 12.5 MG PO TABS: 18.7500 mg | ORAL_TABLET | Freq: Two times a day (BID) | ORAL | Status: DC

## 2012-06-20 NOTE — Assessment & Plan Note (Signed)
Her congestive heart failure symptoms are class II. Her blood pressure remains elevated. I've asked the patient to increase her dose of carvedilol from 12.5 mg twice daily to 18.375 mg twice daily.

## 2012-06-20 NOTE — Progress Notes (Signed)
HPI Brianna Chandler returns today for followup. She is a very pleasant 79 year old woman with hypertension and symptomatic bradycardia, status post permanent pacemaker insertion. In the interim, she has been stable although she notes that her blood pressure has been elevated. She denies dietary or medical noncompliance. No syncope, chest pain, but she does have class II heart failure symptoms and trace peripheral edema. No Known Allergies   Current Outpatient Prescriptions  Medication Sig Dispense Refill  . amLODipine (NORVASC) 5 MG tablet Take 5 mg by mouth daily.      Marland Kitchen aspirin EC 81 MG tablet Take 1 tablet (81 mg total) by mouth daily.  90 tablet  3  . atorvastatin (LIPITOR) 40 MG tablet Take 1 tablet (40 mg total) by mouth at bedtime.  30 tablet  3  . carvedilol (COREG) 12.5 MG tablet Take 1.5 tablets (18.75 mg total) by mouth 2 (two) times daily with a meal.  90 tablet  6  . clopidogrel (PLAVIX) 75 MG tablet Take 1 tablet (75 mg total) by mouth daily.  30 tablet  6  . furosemide (LASIX) 20 MG tablet Take 20 mg by mouth 2 (two) times daily.       Marland Kitchen HYDROcodone-acetaminophen (NORCO) 5-325 MG per tablet Take 1 tablet by mouth every 4 (four) hours as needed. For pain      . isosorbide mononitrate (IMDUR) 30 MG 24 hr tablet Take 30 mg by mouth daily.      Marland Kitchen lisinopril (PRINIVIL,ZESTRIL) 20 MG tablet Take 20 mg by mouth every morning.       Marland Kitchen losartan (COZAAR) 50 MG tablet Take 50 mg by mouth every morning.       . metFORMIN (GLUMETZA) 500 MG (MOD) 24 hr tablet Take 500 mg by mouth 2 (two) times daily with a meal.      . Multiple Vitamin (MULTIVITAMIN) tablet Take 1 tablet by mouth daily.        . nitroGLYCERIN (NITROSTAT) 0.4 MG SL tablet Place 0.4 mg under the tongue every 5 (five) minutes x 3 doses as needed.        No current facility-administered medications for this visit.     Past Medical History  Diagnosis Date  . Diastolic CHF     echo 12/2011 EF 60-65%, no RWMAs, grade 2 diastolic  dysfunction, mild MR, mild-mod LAE, PASP  . DM (diabetes mellitus)   . Hypertension   . Osteoarthritis     Unspec whether gen/loc unspec site  . Obesity   . Renal insufficiency   . CAD (coronary artery disease) February 05, 2009    NSTEMI s/p DES prox LAD, nl LV fuction after sent placement  . Symptomatic bradycardia     a. 12/2011 s/p MDT Jana Half DC PPM, [id number] H  . Syncope     ROS:   All systems reviewed and negative except as noted in the HPI.   Past Surgical History  Procedure Laterality Date  . Total abdominal hysterectomy    . Cataract extraction       Family History  Problem Relation Age of Onset  . Diabetes    . Hypertension    . Coronary artery disease       History   Social History  . Marital Status: Married    Spouse Name: N/A    Number of Children: N/A  . Years of Education: N/A   Occupational History  . Retired    Social History Main Topics  . Smoking status: Never Smoker   .  Smokeless tobacco: Former Neurosurgeon    Types: Snuff    Quit date: 04/26/1990  . Alcohol Use: No  . Drug Use: No  . Sexually Active: Not on file   Other Topics Concern  . Not on file   Social History Narrative  . No narrative on file     BP 173/72  Pulse 66  Ht 5\' 2"  (1.575 m)  Wt 209 lb (94.802 kg)  BMI 38.22 kg/m2  Physical Exam:  Well appearing elderly woman, NAD HEENT: Unremarkable Neck:  7 cm JVD, no thyromegally Lungs:  Clear with no wheezes, rales, or rhonchi. HEART:  Regular rate rhythm, no murmurs, no rubs, no clicks Abd:  soft, positive bowel sounds, no organomegally, no rebound, no guarding Ext:  2 plus pulses, no edema, no cyanosis, no clubbing Skin:  No rashes no nodules Neuro:  CN II through XII intact, motor grossly intact  EKG sinus rhythm with left anterior fascicular block  DEVICE  Normal device function.  See PaceArt for details.   Assess/Plan:

## 2012-06-20 NOTE — Assessment & Plan Note (Signed)
The patient remained stable after pacemaker insertion in her bradycardia has resolved.

## 2012-06-20 NOTE — Patient Instructions (Addendum)
Your physician wants you to follow-up in: 1 year with Dr. Taylor. You will receive a reminder letter in the mail two months in advance. If you don't receive a letter, please call our office to schedule the follow-up appointment.  

## 2012-06-20 NOTE — Assessment & Plan Note (Signed)
Her Medtronic dual-chamber pacemaker is working normally. We'll plan to recheck in several months. 

## 2012-07-20 ENCOUNTER — Encounter: Payer: Self-pay | Admitting: Internal Medicine

## 2012-07-22 ENCOUNTER — Encounter: Payer: Self-pay | Admitting: Physician Assistant

## 2012-07-23 ENCOUNTER — Encounter: Payer: Self-pay | Admitting: Physician Assistant

## 2012-08-17 ENCOUNTER — Ambulatory Visit: Payer: Medicare Other | Admitting: Physician Assistant

## 2012-08-28 ENCOUNTER — Encounter: Payer: Self-pay | Admitting: Cardiology

## 2012-08-28 DIAGNOSIS — E782 Mixed hyperlipidemia: Secondary | ICD-10-CM | POA: Insufficient documentation

## 2012-08-29 ENCOUNTER — Other Ambulatory Visit: Payer: Self-pay | Admitting: *Deleted

## 2012-08-29 MED ORDER — CLOPIDOGREL BISULFATE 75 MG PO TABS: 75.0000 mg | ORAL_TABLET | Freq: Every day | ORAL | Status: DC

## 2012-09-01 ENCOUNTER — Encounter: Payer: Self-pay | Admitting: Cardiology

## 2012-09-01 ENCOUNTER — Ambulatory Visit (INDEPENDENT_AMBULATORY_CARE_PROVIDER_SITE_OTHER): Payer: Medicare PPO | Admitting: Cardiology

## 2012-09-01 VITALS — BP 132/74 | HR 67 | Ht 62.0 in | Wt 212.0 lb

## 2012-09-01 DIAGNOSIS — I10 Essential (primary) hypertension: Secondary | ICD-10-CM

## 2012-09-01 DIAGNOSIS — Z950 Presence of cardiac pacemaker: Secondary | ICD-10-CM

## 2012-09-01 DIAGNOSIS — E782 Mixed hyperlipidemia: Secondary | ICD-10-CM

## 2012-09-01 DIAGNOSIS — I2510 Atherosclerotic heart disease of native coronary artery without angina pectoris: Secondary | ICD-10-CM

## 2012-09-01 NOTE — Patient Instructions (Addendum)

## 2012-09-01 NOTE — Progress Notes (Signed)
Clinical Summary Brianna Chandler is an 79 y.o.female last seen in the office by Mr. Serpe in February, a former patient of Dr. Andee Lineman.  She underwent a Lexiscan Cardiolite in January of this year demonstrating a small to medium sized inferior defect consistent with scar and mild peri-infarct ischemia, LVEF 50%. Medical therapy was recommended.  Lab work from March revealed BUN 23, creatinine 0.7, potassium 4.0, hemoglobin 13.3.  She is here with her daughter. She reports no significant angina symptoms. States that she has been taking her medications regularly, and keeps followup with Dr. Margo Common. She uses a walker, has had no recent falls. No reported bleeding problems. Stable appetite.  No Known Allergies  Current Outpatient Prescriptions  Medication Sig Dispense Refill  . amLODipine (NORVASC) 5 MG tablet Take 5 mg by mouth daily.      Marland Kitchen aspirin EC 81 MG tablet Take 1 tablet (81 mg total) by mouth daily.  90 tablet  3  . atorvastatin (LIPITOR) 40 MG tablet Take 1 tablet (40 mg total) by mouth at bedtime.  30 tablet  3  . carvedilol (COREG) 12.5 MG tablet Take 1.5 tablets (18.75 mg total) by mouth 2 (two) times daily with a meal.  90 tablet  6  . clopidogrel (PLAVIX) 75 MG tablet Take 1 tablet (75 mg total) by mouth daily.  30 tablet  6  . furosemide (LASIX) 20 MG tablet Take 20 mg by mouth 2 (two) times daily.       Marland Kitchen lisinopril (PRINIVIL,ZESTRIL) 20 MG tablet Take 20 mg by mouth every morning.       Marland Kitchen losartan (COZAAR) 50 MG tablet Take 50 mg by mouth every morning.       . metFORMIN (GLUMETZA) 500 MG (MOD) 24 hr tablet Take 500 mg by mouth 2 (two) times daily with a meal.      . Multiple Vitamin (MULTIVITAMIN) tablet Take 1 tablet by mouth daily.        . nitroGLYCERIN (NITROSTAT) 0.4 MG SL tablet Place 0.4 mg under the tongue every 5 (five) minutes x 3 doses as needed.       . traMADol (ULTRAM) 50 MG tablet Take 1 tablet by mouth 2 (two) times daily as needed.       No current  facility-administered medications for this visit.    Past Medical History  Diagnosis Date  . Chronic diastolic CHF (congestive heart failure)     Echo 12/2011 EF 60-65%, no RWMAs, grade 2 diastolic dysfunction, mild MR, mild-mod LAE, PASP  . Type 2 diabetes mellitus   . Essential hypertension, benign   . Osteoarthritis     Unspec whether gen/loc unspec site  . Obesity   . CKD (chronic kidney disease) stage 2, GFR 60-89 ml/min   . Coronary atherosclerosis of native coronary artery February 05, 2009    NSTEMI s/p DES prox LAD  . Symptomatic bradycardia     a. 12/2011 s/p MDT Jana Half DC PPM, [id number] H  . Syncope     Social History Ms. Luse reports that she has never smoked. She quit smokeless tobacco use about 22 years ago. Her smokeless tobacco use included Snuff. Ms. Parish reports that she does not drink alcohol.  Review of Systems No palpitations. No dizziness or syncope. Otherwise negative.  Physical Examination Filed Vitals:   09/01/12 1301  BP: 132/74  Pulse: 67   Filed Weights   09/01/12 1301  Weight: 212 lb (96.163 kg)   Overweight woman in no acute  distress. HEENT: Conjunctiva and lids normal, oropharynx clear. Neck: Supple, no elevated JVP, no thyromegaly. Lungs: Clear to auscultation, nonlabored breathing at rest. Cardiac: Regular rate and rhythm, no S3, soft systolic murmur. Abdomen: Soft, nontender, bowel sounds present. Extremities: Trace edema, distal pulses 2+. Skin: Warm and dry. Musculoskeletal: Mild kyphosis. Neuropsychiatric: Alert and oriented x3, affect grossly appropriate.   Problem List and Plan   CORONARY ATHEROSCLEROSIS NATIVE CORONARY ARTERY No active angina symptoms. Cardiolite results from January reviewed. Continue medical therapy and observation.  Essential hypertension, benign No change to current regimen.  Mixed hyperlipidemia She continues on Lipitor, followed by Dr. Margo Common.  Pacemaker-Medtronic Keep followup with  Dr. Ladona Ridgel.    Jonelle Sidle, M.D., F.A.C.C.

## 2012-09-01 NOTE — Assessment & Plan Note (Signed)
Keep followup with Dr. Taylor. 

## 2012-09-01 NOTE — Assessment & Plan Note (Signed)
No active angina symptoms. Cardiolite results from January reviewed. Continue medical therapy and observation.

## 2012-09-01 NOTE — Assessment & Plan Note (Signed)
She continues on Lipitor, followed by Dr. Tapper. 

## 2012-09-01 NOTE — Assessment & Plan Note (Signed)
No change to current regimen. 

## 2012-10-18 ENCOUNTER — Encounter: Payer: Self-pay | Admitting: Cardiology

## 2012-10-18 DIAGNOSIS — M79609 Pain in unspecified limb: Secondary | ICD-10-CM

## 2012-11-03 ENCOUNTER — Encounter (HOSPITAL_COMMUNITY): Payer: Self-pay | Admitting: Pharmacy Technician

## 2012-11-13 ENCOUNTER — Ambulatory Visit (HOSPITAL_COMMUNITY)
Admission: RE | Admit: 2012-11-13 | Discharge: 2012-11-13 | Disposition: A | Discharge status: Home / Self Care | Payer: Medicare PPO | Source: Ambulatory Visit | Attending: Ophthalmology | Admitting: Ophthalmology

## 2012-11-13 ENCOUNTER — Encounter (HOSPITAL_COMMUNITY): Payer: Self-pay | Admitting: *Deleted

## 2012-11-13 ENCOUNTER — Encounter (HOSPITAL_COMMUNITY)
Admission: RE | Disposition: A | Discharge status: Home / Self Care | Payer: Self-pay | Source: Ambulatory Visit | Attending: Ophthalmology

## 2012-11-13 DIAGNOSIS — I10 Essential (primary) hypertension: Secondary | ICD-10-CM | POA: Insufficient documentation

## 2012-11-13 DIAGNOSIS — H5200 Hypermetropia, unspecified eye: Secondary | ICD-10-CM | POA: Insufficient documentation

## 2012-11-13 DIAGNOSIS — Z7982 Long term (current) use of aspirin: Secondary | ICD-10-CM | POA: Insufficient documentation

## 2012-11-13 DIAGNOSIS — I519 Heart disease, unspecified: Secondary | ICD-10-CM | POA: Insufficient documentation

## 2012-11-13 DIAGNOSIS — Z961 Presence of intraocular lens: Secondary | ICD-10-CM | POA: Insufficient documentation

## 2012-11-13 DIAGNOSIS — H52209 Unspecified astigmatism, unspecified eye: Secondary | ICD-10-CM | POA: Insufficient documentation

## 2012-11-13 DIAGNOSIS — E11359 Type 2 diabetes mellitus with proliferative diabetic retinopathy without macular edema: Secondary | ICD-10-CM | POA: Insufficient documentation

## 2012-11-13 DIAGNOSIS — H4310 Vitreous hemorrhage, unspecified eye: Secondary | ICD-10-CM | POA: Insufficient documentation

## 2012-11-13 DIAGNOSIS — E7800 Pure hypercholesterolemia, unspecified: Secondary | ICD-10-CM | POA: Insufficient documentation

## 2012-11-13 DIAGNOSIS — I252 Old myocardial infarction: Secondary | ICD-10-CM | POA: Insufficient documentation

## 2012-11-13 DIAGNOSIS — E1139 Type 2 diabetes mellitus with other diabetic ophthalmic complication: Secondary | ICD-10-CM | POA: Insufficient documentation

## 2012-11-13 DIAGNOSIS — H409 Unspecified glaucoma: Secondary | ICD-10-CM | POA: Insufficient documentation

## 2012-11-13 DIAGNOSIS — Z79899 Other long term (current) drug therapy: Secondary | ICD-10-CM | POA: Insufficient documentation

## 2012-11-13 DIAGNOSIS — H524 Presbyopia: Secondary | ICD-10-CM | POA: Insufficient documentation

## 2012-11-13 DIAGNOSIS — H10509 Unspecified blepharoconjunctivitis, unspecified eye: Secondary | ICD-10-CM | POA: Insufficient documentation

## 2012-11-13 DIAGNOSIS — J309 Allergic rhinitis, unspecified: Secondary | ICD-10-CM | POA: Insufficient documentation

## 2012-11-13 DIAGNOSIS — H4011X0 Primary open-angle glaucoma, stage unspecified: Secondary | ICD-10-CM | POA: Insufficient documentation

## 2012-11-13 HISTORY — PX: SLT LASER APPLICATION: SHX6099

## 2012-11-13 SURGERY — SLT LASER APPLICATION
Anesthesia: LOCAL | Laterality: Left

## 2012-11-13 MED ORDER — PILOCARPINE HCL 1 % OP SOLN
OPHTHALMIC | Status: AC
  Filled 2012-11-13: qty 15

## 2012-11-13 MED ORDER — TETRACAINE HCL 0.5 % OP SOLN
1.0000 [drp] | Freq: Once | OPHTHALMIC | Status: AC
  Administered 2012-11-13: 1 [drp] via OPHTHALMIC

## 2012-11-13 MED ORDER — TETRACAINE HCL 0.5 % OP SOLN
OPHTHALMIC | Status: AC
  Filled 2012-11-13: qty 2

## 2012-11-13 MED ORDER — APRACLONIDINE HCL 1 % OP SOLN
OPHTHALMIC | Status: AC
  Filled 2012-11-13: qty 0.1

## 2012-11-13 MED ORDER — PILOCARPINE HCL 1 % OP SOLN
1.0000 [drp] | Freq: Once | OPHTHALMIC | Status: AC
  Administered 2012-11-13: 1 [drp] via OPHTHALMIC

## 2012-11-13 MED ORDER — APRACLONIDINE HCL 1 % OP SOLN
1.0000 [drp] | OPHTHALMIC | Status: AC
  Administered 2012-11-13 (×3): 1 [drp] via OPHTHALMIC

## 2012-11-13 NOTE — Op Note (Signed)
None required

## 2012-11-13 NOTE — H&P (Signed)
I have reviewed the pre printed H&P, the patient was re-examined, and I have identified no significant interval changes in the patient's medical condition.  There is no change in the plan of care since the history and physical of record. 

## 2012-11-13 NOTE — Brief Op Note (Signed)
Brianna Chandler 11/13/2012  Susa Simmonds, MD  Yag Laser Self Test Completedyes. Procedure: SLT, left eye.  Eye Protection Worn by Staff yes. Laser In Use Sign on Door yes.  Laser: Nd:YAG Spot Size: Fixed Power Setting: 0.9-1.2 mJ/burst Position treated: 360 degrees trabecular meshwork Number of shots: 113 Total energy delivered: 112 mJ  Patency of the pre-existing peripheral iridotomy was confirmed visually.  The patient tolerated the procedure without difficulty. No complications were encountered.   Tonometer reading immediately after procedure: 22 mmHg.  The patient was discharged home with the instructions to discontinue all her current glaucoma medications, if any, in the left eye only   Patient verbalizes understanding of discharge instructions yes.   Notes:gonioscopy:  angle grade 4/4 open 360 deg., no synechiae, no pigment, no recessions

## 2012-11-14 ENCOUNTER — Encounter (HOSPITAL_COMMUNITY): Payer: Self-pay | Admitting: Ophthalmology

## 2012-12-25 ENCOUNTER — Other Ambulatory Visit: Payer: Self-pay | Admitting: Physician Assistant

## 2012-12-28 ENCOUNTER — Other Ambulatory Visit: Payer: Self-pay

## 2012-12-28 DIAGNOSIS — I5032 Chronic diastolic (congestive) heart failure: Secondary | ICD-10-CM

## 2012-12-28 MED ORDER — CARVEDILOL 12.5 MG PO TABS: 18.7500 mg | ORAL_TABLET | Freq: Two times a day (BID) | ORAL | Status: DC

## 2013-01-09 ENCOUNTER — Encounter: Payer: Self-pay | Admitting: Internal Medicine

## 2013-01-09 ENCOUNTER — Other Ambulatory Visit: Payer: Self-pay | Admitting: Internal Medicine

## 2013-01-09 ENCOUNTER — Ambulatory Visit (INDEPENDENT_AMBULATORY_CARE_PROVIDER_SITE_OTHER): Payer: Medicare PPO | Admitting: Internal Medicine

## 2013-01-09 VITALS — BP 176/73 | HR 60 | Ht 62.0 in | Wt 216.4 lb

## 2013-01-09 DIAGNOSIS — I5032 Chronic diastolic (congestive) heart failure: Secondary | ICD-10-CM

## 2013-01-09 DIAGNOSIS — I4891 Unspecified atrial fibrillation: Secondary | ICD-10-CM

## 2013-01-09 DIAGNOSIS — I498 Other specified cardiac arrhythmias: Secondary | ICD-10-CM

## 2013-01-09 DIAGNOSIS — Z950 Presence of cardiac pacemaker: Secondary | ICD-10-CM

## 2013-01-09 LAB — PACEMAKER DEVICE OBSERVATION
AL AMPLITUDE: 2.8 mv
AL IMPEDENCE PM: 408 Ohm
AL THRESHOLD: 1 V
RV LEAD AMPLITUDE: 15.67 mv

## 2013-01-09 MED ORDER — CARVEDILOL 12.5 MG PO TABS: 18.7500 mg | ORAL_TABLET | Freq: Two times a day (BID) | ORAL | Status: DC

## 2013-01-09 MED ORDER — RIVAROXABAN 15 MG PO TABS: 15.0000 mg | ORAL_TABLET | Freq: Two times a day (BID) | ORAL | Status: DC

## 2013-01-09 NOTE — Patient Instructions (Addendum)
Your physician recommends that you schedule a follow-up appointment in: 1 year with Dr Ladona Ridgel and [facility] on 04/16/13  Your physician has recommended you make the following change in your medication:  1. Stop Plavix at the end of this month,  then Start Xarelto 15 mg daily

## 2013-01-09 NOTE — Progress Notes (Signed)
HPI Brianna Chandler returns today for followup. She is a very pleasant 79 year old woman with morbid obesity, coronary artery disease status post stenting in 2010, with symptomatic bradycardia, status post permanent pacemaker insertion. In the interim, she has been stable. She denies any falls or bleeding. No chest pain or shortness of breath. No palpitations. No Known Allergies   Current Outpatient Prescriptions  Medication Sig Dispense Refill  . amLODipine (NORVASC) 5 MG tablet TAKE 1 TABLET BY MOUTH DAILY EMERGENCY REFILL FAXED DR  30 tablet  6  . aspirin EC 81 MG tablet Take 1 tablet (81 mg total) by mouth daily.  90 tablet  3  . atorvastatin (LIPITOR) 40 MG tablet Take 1 tablet (40 mg total) by mouth at bedtime.  30 tablet  3  . carvedilol (COREG) 12.5 MG tablet Take 1.5 tablets (18.75 mg total) by mouth 2 (two) times daily with a meal.  90 tablet  6  . clopidogrel (PLAVIX) 75 MG tablet Take 1 tablet (75 mg total) by mouth daily.  30 tablet  6  . furosemide (LASIX) 20 MG tablet Take 20 mg by mouth 2 (two) times daily.       Marland Kitchen lisinopril (PRINIVIL,ZESTRIL) 20 MG tablet Take 20 mg by mouth every morning.       Marland Kitchen losartan (COZAAR) 50 MG tablet Take 50 mg by mouth every morning.       . metFORMIN (GLUMETZA) 500 MG (MOD) 24 hr tablet Take 500 mg by mouth 2 (two) times daily with a meal.      . Multiple Vitamin (MULTIVITAMIN) tablet Take 1 tablet by mouth daily.        . nitroGLYCERIN (NITROSTAT) 0.4 MG SL tablet Place 0.4 mg under the tongue every 5 (five) minutes x 3 doses as needed.        No current facility-administered medications for this visit.     Past Medical History  Diagnosis Date  . Chronic diastolic CHF (congestive heart failure)     Echo 12/2011 EF 60-65%, no RWMAs, grade 2 diastolic dysfunction, mild MR, mild-mod LAE, PASP  . Type 2 diabetes mellitus   . Essential hypertension, benign   . Osteoarthritis     Unspec whether gen/loc unspec site  . Obesity   . CKD  (chronic kidney disease) stage 2, GFR 60-89 ml/min   . Coronary atherosclerosis of native coronary artery February 05, 2009    NSTEMI s/p DES prox LAD  . Symptomatic bradycardia     a. 12/2011 s/p MDT Jana Half DC PPM, [id number]  . Syncope     ROS:   All systems reviewed and negative except as noted in the HPI.   Past Surgical History  Procedure Laterality Date  . Total abdominal hysterectomy    . Cataract extraction    . Pacemaker insertion    . Slt laser application Left 11/13/2012    Procedure: SLT LASER APPLICATION;  Surgeon: Susa Simmonds, MD;  Location: [facility];  Service: Ophthalmology;  Laterality: Left;     Family History  Problem Relation Age of Onset  . Diabetes    . Hypertension    . Coronary artery disease       History   Social History  . Marital Status: Married    Spouse Name: N/A    Number of Children: N/A  . Years of Education: N/A   Occupational History  . Retired    Social History Main Topics  . Smoking status: Never Smoker   .  Smokeless tobacco: Former Neurosurgeon    Types: Snuff    Quit date: 04/26/1990  . Alcohol Use: No  . Drug Use: No  . Sexual Activity: Not on file   Other Topics Concern  . Not on file   Social History Narrative  . No narrative on file     BP 176/73  Pulse 60  Ht 5\' 2"  (1.575 m)  Wt 216 lb 6.4 oz (98.158 kg)  BMI 39.57 kg/m2  Physical Exam:  Obese, elderly appearing 79 year old woman, NAD HEENT: Unremarkable, markedly hirsute Neck:  7 cm JVD, no thyromegally Back:  No CVA tenderness Lungs:  Clear with no wheezes, rales, or rhonchi. HEART:  Regular rate rhythm, no murmurs, no rubs, no clicks Abd:  soft, obese, positive bowel sounds, no organomegally, no rebound, no guarding Ext:  2 plus pulses, no edema, no cyanosis, no clubbing Skin:  No rashes no nodules Neuro:  CN II through XII intact, motor grossly intact  DEVICE  Normal device function.  See PaceArt for details. Atrial fibrillation is  present  Assess/Plan:

## 2013-01-09 NOTE — Assessment & Plan Note (Signed)
Her Medtronic dual-chamber pacemaker is working normally. We'll plan to recheck in several months. 

## 2013-01-09 NOTE — Assessment & Plan Note (Signed)
Interrogation of the patient's pacemaker demonstrates recurrent atrial fibrillation. The longest episode was one hour. We discussed the treatment options. I recommend that she stop her Plavix, and start taking Xarelto. She will continue low-dose aspirin.

## 2013-02-14 ENCOUNTER — Telehealth: Payer: Self-pay | Admitting: Internal Medicine

## 2013-02-14 NOTE — Telephone Encounter (Signed)
New problem:  Pt's caregiver states they were told to send to remote carelink transmission and to call before they sent it. I tried to connect the pt's caregiver with a device tech but no one answered. Please advise

## 2013-02-16 NOTE — Telephone Encounter (Signed)
[name] wants to make sure they can successfully transmit when prior to 04/16/13 appt.  I gave her brief instructions how to connect to phone jack. [name] stated she's in bed and will get to it by Tuesday next week.  She states she will call us back when they are ready for more instructions.

## 2013-03-08 ENCOUNTER — Ambulatory Visit (INDEPENDENT_AMBULATORY_CARE_PROVIDER_SITE_OTHER): Payer: Medicare PPO | Admitting: Cardiology

## 2013-03-08 ENCOUNTER — Encounter: Payer: Self-pay | Admitting: Cardiology

## 2013-03-08 VITALS — BP 127/71 | HR 80 | Ht 62.0 in | Wt 217.0 lb

## 2013-03-08 DIAGNOSIS — I4891 Unspecified atrial fibrillation: Secondary | ICD-10-CM

## 2013-03-08 DIAGNOSIS — I2510 Atherosclerotic heart disease of native coronary artery without angina pectoris: Secondary | ICD-10-CM

## 2013-03-08 DIAGNOSIS — Z950 Presence of cardiac pacemaker: Secondary | ICD-10-CM

## 2013-03-08 NOTE — Assessment & Plan Note (Signed)
Keep followup with Dr. Taylor. 

## 2013-03-08 NOTE — Assessment & Plan Note (Signed)
No active angina symptoms. Status post DES to the LAD back in 2010.

## 2013-03-08 NOTE — Progress Notes (Signed)
Clinical Summary Brianna Chandler is an 79 y.o.female last seen in May of this year. Interval followup with Dr. Ladona Ridgel noted in September. At that time she was noted to have recurrent atrial fibrillation. Plavix was stopped, and she was placed on Xarelto.  Recent lab work from October showed BUN 24, creatinine 0.7, potassium 4.0, hemoglobin 13.4, platelets 146.  Lexiscan Cardiolite in January of this year demonstrated a small to medium sized inferior defect consistent with scar and mild peri-infarct ischemia, LVEF 50%. She has been managed medically.  She is here with a family member. Uses a walker, no falls or unusual bleeding. Does not indicate any palpitations or angina. We reviewed her medications and discussed stopping aspirin.  No Known Allergies  Current Outpatient Prescriptions  Medication Sig Dispense Refill  . amLODipine (NORVASC) 5 MG tablet TAKE 1 TABLET BY MOUTH DAILY EMERGENCY REFILL FAXED DR  30 tablet  6  . atorvastatin (LIPITOR) 40 MG tablet Take 1 tablet (40 mg total) by mouth at bedtime.  30 tablet  3  . carvedilol (COREG) 12.5 MG tablet Take 1.5 tablets (18.75 mg total) by mouth 2 (two) times daily with a meal.  90 tablet  6  . furosemide (LASIX) 20 MG tablet Take 20 mg by mouth 2 (two) times daily.       Marland Kitchen lisinopril (PRINIVIL,ZESTRIL) 20 MG tablet Take 20 mg by mouth every morning.       Marland Kitchen losartan (COZAAR) 50 MG tablet Take 50 mg by mouth every morning.       . metFORMIN (GLUMETZA) 500 MG (MOD) 24 hr tablet Take 500 mg by mouth 2 (two) times daily with a meal.      . Multiple Vitamin (MULTIVITAMIN) tablet Take 1 tablet by mouth daily.        . nitroGLYCERIN (NITROSTAT) 0.4 MG SL tablet Place 0.4 mg under the tongue every 5 (five) minutes x 3 doses as needed.       . prednisoLONE acetate (PRED FORTE) 1 % ophthalmic suspension Place 1 drop into the right eye 3 (three) times daily.       . Rivaroxaban (XARELTO) 15 MG TABS tablet Take 1 tablet (15 mg total) by mouth daily.   30 tablet  11  . trimethoprim-polymyxin b (POLYTRIM) ophthalmic solution Place 1 drop into the right eye 3 (three) times daily.        No current facility-administered medications for this visit.    Past Medical History  Diagnosis Date  . Chronic diastolic CHF (congestive heart failure)     LVEF 60-65%, grade 2 diastolic dysfunction, PASP  . Type 2 diabetes mellitus   . Essential hypertension, benign   . Osteoarthritis   . Obesity   . CKD (chronic kidney disease) stage 2, GFR 60-89 ml/min   . Coronary atherosclerosis of native coronary artery February 05, 2009    NSTEMI s/p DES prox LAD  . Symptomatic bradycardia     12/2011 s/p MDT Sensia DC PPM, [id number] H  . Syncope     Social History Brianna Chandler reports that she has never smoked. She quit smokeless tobacco use about 22 years ago. Her smokeless tobacco use included Snuff. Brianna Chandler reports that she does not drink alcohol.  Review of Systems Some stress in her household. Otherwise as outlined above.  Physical Examination Filed Vitals:   03/08/13 1449  BP: 127/71  Pulse: 80   Filed Weights   03/08/13 1449  Weight: 217 lb (98.431 kg)  Overweight woman, appears comfortable.  HEENT: Conjunctiva and lids normal, oropharynx clear.  Neck: Supple, no elevated JVP, no thyromegaly.  Lungs: Clear to auscultation, nonlabored breathing at rest.  Cardiac: Regular rate and rhythm, no S3, soft systolic murmur.  Abdomen: Soft, nontender, bowel sounds present.  Extremities: Trace edema, distal pulses 2+.  Skin: Warm and dry.  Musculoskeletal: Mild kyphosis.  Neuropsychiatric: Alert and oriented x3, affect grossly appropriate.    Problem List and Plan   Atrial fibrillation Paroxysmal, placed on Xarelto by Dr. Ladona Ridgel. I have asked that she stop aspirin.  CORONARY ATHEROSCLEROSIS NATIVE CORONARY ARTERY No active angina symptoms. Status post DES to the LAD back in 2010.  Pacemaker-Medtronic Keep follow up with Dr.  Ladona Ridgel.    Jonelle Sidle, M.D., F.A.C.C.

## 2013-03-08 NOTE — Assessment & Plan Note (Signed)
Paroxysmal, placed on Xarelto by Dr. Ladona Ridgel. I have asked that she stop aspirin.

## 2013-03-08 NOTE — Patient Instructions (Signed)
   Stop Aspirin Continue all other medications.   Your physician wants you to follow up in: 6 months.  You will receive a reminder letter in the mail one-two months in advance.  If you don't receive a letter, please call our office to schedule the follow up appointment   

## 2013-04-16 ENCOUNTER — Ambulatory Visit (INDEPENDENT_AMBULATORY_CARE_PROVIDER_SITE_OTHER): Payer: Medicare PPO | Admitting: *Deleted

## 2013-04-16 ENCOUNTER — Telehealth: Payer: Self-pay | Admitting: Internal Medicine

## 2013-04-16 DIAGNOSIS — I498 Other specified cardiac arrhythmias: Secondary | ICD-10-CM

## 2013-04-16 DIAGNOSIS — I4891 Unspecified atrial fibrillation: Secondary | ICD-10-CM

## 2013-04-16 NOTE — Telephone Encounter (Signed)
New Problem:  Pt's friend, Darlina Guys, is calling to see if the pt's remote transmission went through. Pt/Pt's friend is requesting a call back.

## 2013-04-16 NOTE — Telephone Encounter (Signed)
Transmission received, patient aware. 

## 2013-04-24 LAB — MDC_IDC_ENUM_SESS_TYPE_REMOTE
Brady Statistic AP VP Percent: 0 %
Brady Statistic AS VP Percent: 0 %
Brady Statistic AS VS Percent: 24 %
Lead Channel Impedance Value: 759 Ohm
Lead Channel Sensing Intrinsic Amplitude: 2.8 mV
Lead Channel Sensing Intrinsic Amplitude: 22.4 mV
Lead Channel Setting Pacing Amplitude: 2 V

## 2013-05-09 ENCOUNTER — Encounter: Payer: Self-pay | Admitting: *Deleted

## 2013-05-15 ENCOUNTER — Encounter: Payer: Self-pay | Admitting: Internal Medicine

## 2013-07-18 ENCOUNTER — Ambulatory Visit (INDEPENDENT_AMBULATORY_CARE_PROVIDER_SITE_OTHER): Payer: Medicare PPO | Admitting: *Deleted

## 2013-07-18 ENCOUNTER — Other Ambulatory Visit: Payer: Self-pay | Admitting: Cardiology

## 2013-07-18 DIAGNOSIS — I4891 Unspecified atrial fibrillation: Secondary | ICD-10-CM

## 2013-07-18 DIAGNOSIS — I498 Other specified cardiac arrhythmias: Secondary | ICD-10-CM

## 2013-07-18 LAB — MDC_IDC_ENUM_SESS_TYPE_REMOTE
Battery Impedance: 150 Ohm
Battery Voltage: 2.79 V
Brady Statistic AP VP Percent: 0.1 % — CL
Brady Statistic AP VS Percent: 71.7 %
Brady Statistic AS VP Percent: 0.1 % — CL
Lead Channel Impedance Value: 397 Ohm
Lead Channel Pacing Threshold Amplitude: 1 V
Lead Channel Sensing Intrinsic Amplitude: 2.8 mV
Lead Channel Setting Pacing Amplitude: 2.5 V
Lead Channel Setting Pacing Pulse Width: 0.4 ms
MDC IDC MSMT LEADCHNL RA PACING THRESHOLD PULSEWIDTH: 0.4 ms
MDC IDC MSMT LEADCHNL RV IMPEDANCE VALUE: 783 Ohm
MDC IDC MSMT LEADCHNL RV PACING THRESHOLD AMPLITUDE: 0.625 V
MDC IDC MSMT LEADCHNL RV PACING THRESHOLD PULSEWIDTH: 0.4 ms
MDC IDC MSMT LEADCHNL RV SENSING INTR AMPL: 11.2 mV
MDC IDC SET LEADCHNL RA PACING AMPLITUDE: 2 V
MDC IDC SET LEADCHNL RV SENSING SENSITIVITY: 5.6 mV
MDC IDC STAT BRADY AS VS PERCENT: 28.1 %

## 2013-07-25 ENCOUNTER — Encounter: Payer: Self-pay | Admitting: *Deleted

## 2013-08-03 ENCOUNTER — Encounter: Payer: Self-pay | Admitting: Internal Medicine

## 2013-08-20 ENCOUNTER — Other Ambulatory Visit: Payer: Self-pay | Admitting: Internal Medicine

## 2013-09-04 ENCOUNTER — Ambulatory Visit (INDEPENDENT_AMBULATORY_CARE_PROVIDER_SITE_OTHER): Payer: Medicare PPO | Admitting: Cardiology

## 2013-09-04 ENCOUNTER — Encounter: Payer: Self-pay | Admitting: Cardiology

## 2013-09-04 VITALS — BP 133/73 | HR 70 | Ht 62.0 in | Wt 202.8 lb

## 2013-09-04 DIAGNOSIS — Z950 Presence of cardiac pacemaker: Secondary | ICD-10-CM

## 2013-09-04 DIAGNOSIS — I4891 Unspecified atrial fibrillation: Secondary | ICD-10-CM

## 2013-09-04 DIAGNOSIS — I2510 Atherosclerotic heart disease of native coronary artery without angina pectoris: Secondary | ICD-10-CM

## 2013-09-04 NOTE — Assessment & Plan Note (Signed)
Keep followup with the device clinic.

## 2013-09-04 NOTE — Progress Notes (Signed)
Clinical Summary [name] is an 80 y.o.female last seen in November 2014. Pacemaker evaluation continues in the device clinic - 13 mode switches noted on last check, all less than 30 seconds with only one high ventricular rate.  She reports no sense of palpitations, has had no obvious bleeding problems on Xarelto. She sees Dr. Margo Chandler in the interim.  Lexiscan Cardiolite in January 2014 demonstrated a small to medium sized inferior defect consistent with scar and mild peri-infarct ischemia, LVEF 50%. She has been managed medically. She does not endorse any angina symptoms with typical ADLs, fairly limited, uses a walker. She denies any falls.  Lab work from January of this year showed BUN 25, creatinine 0.8. Reports recent hemoglobin A1c of 6.1%.  No Known Allergies  Current Outpatient Prescriptions  Medication Sig Dispense Refill  . amLODipine (NORVASC) 5 MG tablet TAKE 1 TABLET BY MOUTH EVERY DAY  30 tablet  6  . atorvastatin (LIPITOR) 40 MG tablet Take 1 tablet (40 mg total) by mouth at bedtime.  30 tablet  3  . carvedilol (COREG) 12.5 MG tablet TAKE 1 & 1/2 TABLET BY MOUTH TWICE DAILY WITH A MEAL.  90 tablet  3  . furosemide (LASIX) 20 MG tablet Take 20 mg by mouth 2 (two) times daily.       Marland Kitchen. lisinopril (PRINIVIL,ZESTRIL) 20 MG tablet Take 20 mg by mouth every morning.       Marland Kitchen. losartan (COZAAR) 50 MG tablet Take 50 mg by mouth every morning.       . metFORMIN (GLUMETZA) 500 MG (MOD) 24 hr tablet Take 500 mg by mouth 2 (two) times daily with a meal.      . Multiple Vitamin (MULTIVITAMIN) tablet Take 1 tablet by mouth daily.        . nitroGLYCERIN (NITROSTAT) 0.4 MG SL tablet Place 0.4 mg under the tongue every 5 (five) minutes x 3 doses as needed.       . Rivaroxaban (XARELTO) 15 MG TABS tablet Take 1 tablet (15 mg total) by mouth daily.  30 tablet  11   No current facility-administered medications for this visit.    Past Medical History  Diagnosis Date  . Chronic diastolic  CHF (congestive heart failure)     LVEF 60-65%, grade 2 diastolic dysfunction, PASP 54mmHg  . Type 2 diabetes mellitus   . Essential hypertension, benign   . Osteoarthritis   . Obesity   . CKD (chronic kidney disease) stage 2, GFR 60-89 ml/min   . Coronary atherosclerosis of native coronary artery February 05, 2009    NSTEMI s/p DES prox LAD  . Symptomatic bradycardia     12/2011 s/p MDT Sensia DC PPM, [id number] H  . Syncope     Past Surgical History  Procedure Laterality Date  . Total abdominal hysterectomy    . Cataract extraction    . Pacemaker insertion    . Slt laser application Left 11/13/2012    Procedure: SLT LASER APPLICATION;  Surgeon: Susa Simmondsarroll F Haines, MD;  Location: [facility];  Service: Ophthalmology;  Laterality: Left;    Social History [name] reports that she has never smoked. She quit smokeless tobacco use about 23 years ago. Her smokeless tobacco use included Snuff. [name] reports that she does not drink alcohol.  Review of Systems Negative except as outlined.  Physical Examination Filed Vitals:   09/04/13 0859  BP: 133/73  Pulse: 70   Filed Weights   09/04/13 0859  Weight: 202  lb 12.8 oz (91.989 kg)    Overweight woman, appears comfortable.  HEENT: Conjunctiva and lids normal, oropharynx clear.  Neck: Supple, no elevated JVP, no thyromegaly.  Lungs: Clear to auscultation, nonlabored breathing at rest.  Cardiac: Regular rate and rhythm, no S3, soft systolic murmur.  Abdomen: Soft, nontender, bowel sounds present.  Extremities: Trace edema, distal pulses 2+.  Skin: Warm and dry.  Musculoskeletal: Mild kyphosis.  Neuropsychiatric: Alert and oriented x3, affect grossly appropriate.   Problem List and Plan   Atrial fibrillation Paroxysmal, typically brief episodes based on device interrogations. She does not endorse any palpitations. Plan to continue current regimen which includes Coreg and Xarelto. Followup in 6 months, BMET and CBC for that  visit.  CORONARY ATHEROSCLEROSIS NATIVE CORONARY ARTERY No active angina symptoms. Status post DES to the LAD back in 2010. Continue observation.  Pacemaker-Medtronic Keep followup with the device clinic.    Jonelle SidleSamuel G. McDowell, M.D., F.A.C.C.

## 2013-09-04 NOTE — Assessment & Plan Note (Signed)
No active angina symptoms. Status post DES to the LAD back in 2010. Continue observation.

## 2013-09-04 NOTE — Patient Instructions (Signed)
Your physician recommends that you schedule a follow-up appointment in: 6 months. You will receive a reminder letter in the mail in about 4 months reminding you to call and schedule your appointment. If you don't receive this letter, please contact our office. Your physician recommends that you continue on your current medications as directed. Please refer to the Current Medication list given to you today. Your physician recommends that you have lab work in 6 months just before your next visit to check your BMET and CBC.

## 2013-09-04 NOTE — Assessment & Plan Note (Signed)
Paroxysmal, typically brief episodes based on device interrogations. She does not endorse any palpitations. Plan to continue current regimen which includes Coreg and Xarelto. Followup in 6 months, BMET and CBC for that visit.

## 2013-09-24 ENCOUNTER — Telehealth: Payer: Self-pay | Admitting: Internal Medicine

## 2013-09-24 NOTE — Telephone Encounter (Signed)
Since Dr. Ladona Ridgel started Xarelto, I think it would be best to keep him involved in the decision of whether it should stopped or not. The patient's CHADSVASC score (risk of stroke with atrial fibrillation) is 7, placing her at relatively high risk of stroke on aspirin alone. She could potentially be considered for a different agent such as Eliquis, perhaps with less risk of bleeding risk howerver. It should also be made clear that she should not have joint injections done unless her anticoagulant has been stopped beforehand. My preference would be to not make any major change until Dr. Ladona Ridgel has a chance to review this and have his input.

## 2013-09-24 NOTE — Telephone Encounter (Signed)
Please advise Dr Ladona Ridgel.

## 2013-09-24 NOTE — Telephone Encounter (Signed)
Please call patient regarding concern about taking Xarelto / tgs

## 2013-09-24 NOTE — Telephone Encounter (Signed)
Pt's sitter Orest Dikes) called and stated that the "pt gets injections in her knees every 3 months and it bleeds like crazy." Lanora Manis stated she also busies real bad and she is also prone to falls. Lanora Manis states that Dr Case in [facility] in [place] told the pt he did not like her on Xarelto. Lanora Manis wants to know if she can be taken off Xarelto and on ASA. Dr Ladona Ridgel put pt on Xarelto in Sept of 2014. Please advise due to Dr Ladona Ridgel absence.

## 2013-10-04 NOTE — Telephone Encounter (Signed)
Brianna Chandler, the patient should remain on Xarelto. If she needs injections every 3 months, her Xarelto could be stopped 2 days before the procedure and restarted when her surgeon thinks it safe realizing that the peak of action of the drug is around 6 hours after ingestion.

## 2013-10-22 ENCOUNTER — Ambulatory Visit (INDEPENDENT_AMBULATORY_CARE_PROVIDER_SITE_OTHER): Payer: Medicare PPO | Admitting: *Deleted

## 2013-10-22 DIAGNOSIS — I498 Other specified cardiac arrhythmias: Secondary | ICD-10-CM

## 2013-10-22 DIAGNOSIS — Z950 Presence of cardiac pacemaker: Secondary | ICD-10-CM

## 2013-10-22 NOTE — Progress Notes (Signed)
Remote pacemaker transmission.   

## 2013-10-23 LAB — MDC_IDC_ENUM_SESS_TYPE_REMOTE
Battery Remaining Longevity: 119 mo
Battery Voltage: 2.79 V
Brady Statistic AP VP Percent: 0 %
Brady Statistic AP VS Percent: 77 %
Brady Statistic AS VP Percent: 0 %
Date Time Interrogation Session: 20150629132742
Lead Channel Impedance Value: 382 Ohm
Lead Channel Impedance Value: 746 Ohm
Lead Channel Pacing Threshold Amplitude: 0.875 V
Lead Channel Pacing Threshold Pulse Width: 0.4 ms
Lead Channel Sensing Intrinsic Amplitude: 2.8 mV
Lead Channel Setting Pacing Amplitude: 2.5 V
Lead Channel Setting Pacing Pulse Width: 0.4 ms
MDC IDC MSMT BATTERY IMPEDANCE: 150 Ohm
MDC IDC MSMT LEADCHNL RV PACING THRESHOLD AMPLITUDE: 0.625 V
MDC IDC MSMT LEADCHNL RV PACING THRESHOLD PULSEWIDTH: 0.4 ms
MDC IDC MSMT LEADCHNL RV SENSING INTR AMPL: 11.2 mV
MDC IDC SET LEADCHNL RA PACING AMPLITUDE: 2 V
MDC IDC SET LEADCHNL RV SENSING SENSITIVITY: 4 mV
MDC IDC STAT BRADY AS VS PERCENT: 23 %

## 2013-10-31 ENCOUNTER — Encounter: Payer: Self-pay | Admitting: Cardiology

## 2013-11-06 ENCOUNTER — Encounter: Payer: Self-pay | Admitting: Internal Medicine

## 2013-12-14 ENCOUNTER — Other Ambulatory Visit: Payer: Self-pay | Admitting: Cardiology

## 2013-12-14 ENCOUNTER — Other Ambulatory Visit: Payer: Self-pay | Admitting: *Deleted

## 2013-12-14 MED ORDER — CARVEDILOL 12.5 MG PO TABS: ORAL_TABLET | ORAL | Status: DC

## 2013-12-17 ENCOUNTER — Encounter: Payer: Self-pay | Admitting: Cardiology

## 2014-01-10 ENCOUNTER — Encounter: Payer: Self-pay | Admitting: *Deleted

## 2014-01-10 ENCOUNTER — Encounter: Payer: Medicare PPO | Admitting: Internal Medicine

## 2014-01-14 ENCOUNTER — Other Ambulatory Visit: Payer: Self-pay | Admitting: Internal Medicine

## 2014-01-15 NOTE — Telephone Encounter (Signed)
Medication sent to pharmacy  

## 2014-02-12 ENCOUNTER — Other Ambulatory Visit: Payer: Self-pay | Admitting: Cardiology

## 2014-02-19 ENCOUNTER — Ambulatory Visit (INDEPENDENT_AMBULATORY_CARE_PROVIDER_SITE_OTHER): Payer: Medicare PPO | Admitting: Internal Medicine

## 2014-02-19 ENCOUNTER — Encounter: Payer: Self-pay | Admitting: Internal Medicine

## 2014-02-19 VITALS — BP 144/82 | HR 81 | Ht 60.0 in | Wt 219.0 lb

## 2014-02-19 DIAGNOSIS — I4891 Unspecified atrial fibrillation: Secondary | ICD-10-CM

## 2014-02-19 DIAGNOSIS — Z950 Presence of cardiac pacemaker: Secondary | ICD-10-CM

## 2014-02-19 DIAGNOSIS — I10 Essential (primary) hypertension: Secondary | ICD-10-CM

## 2014-02-19 DIAGNOSIS — I5032 Chronic diastolic (congestive) heart failure: Secondary | ICD-10-CM

## 2014-02-19 LAB — MDC_IDC_ENUM_SESS_TYPE_INCLINIC
Battery Impedance: 150 Ohm
Battery Remaining Longevity: 119 mo
Battery Voltage: 2.79 V
Brady Statistic AP VP Percent: 0 %
Brady Statistic AP VS Percent: 81 %
Brady Statistic AS VP Percent: 0 %
Brady Statistic AS VS Percent: 19 %
Date Time Interrogation Session: 20151027122551
Lead Channel Impedance Value: 383 Ohm
Lead Channel Impedance Value: 755 Ohm
Lead Channel Pacing Threshold Amplitude: 0.75 V
Lead Channel Pacing Threshold Amplitude: 0.75 V
Lead Channel Pacing Threshold Pulse Width: 0.4 ms
Lead Channel Pacing Threshold Pulse Width: 0.4 ms
Lead Channel Sensing Intrinsic Amplitude: 15.67 mV
Lead Channel Sensing Intrinsic Amplitude: 4 mV
Lead Channel Setting Pacing Amplitude: 2 V
Lead Channel Setting Pacing Amplitude: 2.5 V
Lead Channel Setting Pacing Pulse Width: 0.4 ms
Lead Channel Setting Sensing Sensitivity: 4 mV

## 2014-02-19 NOTE — Assessment & Plan Note (Signed)
Compared to her last visit, she is maintaining NSR 99.9% of the time.

## 2014-02-19 NOTE — Assessment & Plan Note (Signed)
Her symptoms appear to be class 2. She will continue her current meds. We discussed the importance of weight loss, maintaining a low sodium diet, and continuing her current meds. Will follow.

## 2014-02-19 NOTE — Progress Notes (Signed)
HPI Brianna Chandler returns today for followup. She is a very pleasant 80 year old woman with morbid obesity, coronary artery disease status post stenting in 2010, with symptomatic bradycardia, status post permanent pacemaker insertion. In the interim, she has been stable. She denies any falls or bleeding. No palpitations. She has had one episode of chest pressure and another episode of dyspnea. Both were non-exertional. NTG did not improve her symptoms. She has not been in the hospital.  No Known Allergies   Current Outpatient Prescriptions  Medication Sig Dispense Refill  . amLODipine (NORVASC) 5 MG tablet TAKE 1 TABLET BY MOUTH EVERY DAY  30 tablet  6  . atorvastatin (LIPITOR) 40 MG tablet Take 1 tablet (40 mg total) by mouth at bedtime.  30 tablet  3  . carvedilol (COREG) 12.5 MG tablet TAKE 1 & 1/2 TABLETS BY MOUTH TWICE DAILY WITH A MEAL.  90 tablet  3  . furosemide (LASIX) 20 MG tablet Take 20 mg by mouth 2 (two) times daily.       Marland Kitchen. lisinopril (PRINIVIL,ZESTRIL) 20 MG tablet Take 20 mg by mouth every morning.       Marland Kitchen. losartan (COZAAR) 50 MG tablet Take 50 mg by mouth every morning.       . metFORMIN (GLUMETZA) 500 MG (MOD) 24 hr tablet Take 500 mg by mouth 2 (two) times daily with a meal.      . Multiple Vitamin (MULTIVITAMIN) tablet Take 1 tablet by mouth daily.        . nitroGLYCERIN (NITROSTAT) 0.4 MG SL tablet Place 0.4 mg under the tongue every 5 (five) minutes x 3 doses as needed for chest pain (MAX 3 TABLETS).       . Rivaroxaban (XARELTO) 15 MG TABS tablet Take 1 tablet (15 mg total) by mouth daily.  30 tablet  11  . tobramycin-dexamethasone (TOBRADEX) ophthalmic solution Place 1 drop into the right eye as directed.       No current facility-administered medications for this visit.     Past Medical History  Diagnosis Date  . Chronic diastolic CHF (congestive heart failure)     LVEF 60-65%, grade 2 diastolic dysfunction, PASP 54mmHg  . Type 2 diabetes mellitus   . Essential  hypertension, benign   . Osteoarthritis   . Obesity   . CKD (chronic kidney disease) stage 2, GFR 60-89 ml/min   . Coronary atherosclerosis of native coronary artery February 05, 2009    NSTEMI s/p DES prox LAD  . Symptomatic bradycardia     12/2011 s/p MDT Sensia DC PPM, [id number] H  . Syncope     ROS:   All systems reviewed and negative except as noted in the HPI.   Past Surgical History  Procedure Laterality Date  . Total abdominal hysterectomy    . Cataract extraction    . Pacemaker insertion    . Slt laser application Left 11/13/2012    Procedure: SLT LASER APPLICATION;  Surgeon: Susa Simmondsarroll F Haines, MD;  Location: [facility];  Service: Ophthalmology;  Laterality: Left;     Family History  Problem Relation Age of Onset  . Diabetes    . Hypertension    . Coronary artery disease       History   Social History  . Marital Status: Married    Spouse Name: N/A    Number of Children: N/A  . Years of Education: N/A   Occupational History  . Retired    Social History Main Topics  . Smoking status:  Never Smoker   . Smokeless tobacco: Former NeurosurgeonUser    Types: Snuff    Quit date: 04/26/1990  . Alcohol Use: No  . Drug Use: No  . Sexual Activity: Not on file   Other Topics Concern  . Not on file   Social History Narrative  . No narrative on file     BP 144/82  Pulse 81  Ht 5' (1.524 m)  Wt 219 lb (99.338 kg)  BMI 42.77 kg/m2  Physical Exam:  Obese, elderly appearing 80 year old woman, NAD HEENT: Unremarkable, markedly hirsute Neck:  7 cm JVD, no thyromegally Back:  No CVA tenderness Lungs:  Clear with no wheezes, rales, or rhonchi. HEART:  Regular rate rhythm, no murmurs, no rubs, no clicks Abd:  soft, obese, positive bowel sounds, no organomegally, no rebound, no guarding Ext:  2 plus pulses, no edema, no cyanosis, no clubbing Skin:  No rashes no nodules Neuro:  CN II through XII intact, motor grossly intact  DEVICE  Normal device function.  See PaceArt for  details. Atrial fibrillation is absent today  Assess/Plan:

## 2014-02-19 NOTE — Assessment & Plan Note (Signed)
Her Medtronic PPM is working normally. Will recheck in several months.  

## 2014-02-19 NOTE — Assessment & Plan Note (Signed)
Her blood pressure is slightly elevated today. She admits to sodium indiscretion. Will follow.

## 2014-02-19 NOTE — Patient Instructions (Signed)
Remote monitoring is used to monitor your Pacemaker of ICD from home. This monitoring reduces the number of office visits required to check your device to one time per year. It allows us to keep an eye on the functioning of your device to ensure it is working properly. You are scheduled for a device check from home on 05/23/13. You may send your transmission at any time that day. If you have a wireless device, the transmission will be sent automatically. After your physician reviews your transmission, you will receive a postcard with your next transmission date.  Your physician wants you to follow-up in: 1 year with Dr. Taylor. You will receive a reminder letter in the mail two months in advance. If you don't receive a letter, please call our office to schedule the follow-up appointment.  Your physician recommends that you continue on your current medications as directed. Please refer to the Current Medication list given to you today.   

## 2014-03-20 ENCOUNTER — Ambulatory Visit (INDEPENDENT_AMBULATORY_CARE_PROVIDER_SITE_OTHER): Payer: Medicare PPO | Admitting: Cardiology

## 2014-03-20 ENCOUNTER — Encounter: Payer: Self-pay | Admitting: Cardiology

## 2014-03-20 VITALS — BP 150/87 | HR 70 | Ht 62.0 in

## 2014-03-20 DIAGNOSIS — I5032 Chronic diastolic (congestive) heart failure: Secondary | ICD-10-CM

## 2014-03-20 DIAGNOSIS — I480 Paroxysmal atrial fibrillation: Secondary | ICD-10-CM

## 2014-03-20 DIAGNOSIS — I10 Essential (primary) hypertension: Secondary | ICD-10-CM

## 2014-03-20 DIAGNOSIS — I25119 Atherosclerotic heart disease of native coronary artery with unspecified angina pectoris: Secondary | ICD-10-CM

## 2014-03-20 MED ORDER — ISOSORBIDE MONONITRATE ER 30 MG PO TB24: 30.0000 mg | ORAL_TABLET | Freq: Every day | ORAL | Status: DC

## 2014-03-20 NOTE — Assessment & Plan Note (Signed)
Reporting symptoms suggestive of intermittent angina. Prior history of DES to the LAD in 2010. Ischemic testing from last year was overall low risk. Plan is to continue medical therapy, we will add Imdur 30 mg daily to see if this helps.

## 2014-03-20 NOTE — Assessment & Plan Note (Signed)
Continues on Coreg, Norvasc, and Cozaar.

## 2014-03-20 NOTE — Progress Notes (Signed)
Reason for visit: CAD, diastolic heart failure, atrial fibrillation  Clinical Summary Brianna Chandler is an 80 y.o.female last seen in May. Interval follow-up noted with Dr. Ladona Ridgelaylor in October. Device functioning normally at that time and patient maintaining sinus rhythm the vast majority of the time. She is here with a family member who is a Engineer, civil (consulting)nurse. She does not offer any major complaints, however it does sound like she has had some potential angina symptoms intermittently over the last few months. She has taken nitroglycerin on at least one occasion. Otherwise no palpitations, no obvious bleeding problems on Xarelto.  ECG today shows atrial paced rhythm with decreased R wave progress aggression and left anterior fascicular block.  Lexiscan Cardiolite in January 2014 demonstrated a small to medium sized inferior defect consistent with scar and mild peri-infarct ischemia, LVEF 50%. She has been managed medically  No Known Allergies  Current Outpatient Prescriptions  Medication Sig Dispense Refill  . amLODipine (NORVASC) 5 MG tablet TAKE 1 TABLET BY MOUTH EVERY DAY 30 tablet 6  . atorvastatin (LIPITOR) 40 MG tablet Take 1 tablet (40 mg total) by mouth at bedtime. 30 tablet 3  . carvedilol (COREG) 12.5 MG tablet TAKE 1 & 1/2 TABLETS BY MOUTH TWICE DAILY WITH A MEAL. 90 tablet 3  . furosemide (LASIX) 20 MG tablet Take 20 mg by mouth 2 (two) times daily.     Marland Kitchen. lisinopril (PRINIVIL,ZESTRIL) 20 MG tablet Take 20 mg by mouth every morning.     Marland Kitchen. losartan (COZAAR) 50 MG tablet Take 50 mg by mouth every morning.     . metFORMIN (GLUMETZA) 500 MG (MOD) 24 hr tablet Take 500 mg by mouth 2 (two) times daily with a meal.    . Multiple Vitamin (MULTIVITAMIN) tablet Take 1 tablet by mouth daily.      . nitroGLYCERIN (NITROSTAT) 0.4 MG SL tablet Place 0.4 mg under the tongue every 5 (five) minutes x 3 doses as needed for chest pain (MAX 3 TABLETS).     Marland Kitchen. oxybutynin (DITROPAN) 5 MG tablet Take 5 mg by mouth  daily.    . Rivaroxaban (XARELTO) 15 MG TABS tablet Take 1 tablet (15 mg total) by mouth daily. 30 tablet 11  . tobramycin-dexamethasone (TOBRADEX) ophthalmic solution Place 1 drop into the right eye as directed.    . traMADol (ULTRAM) 50 MG tablet Take by mouth every 4 (four) hours as needed.    . isosorbide mononitrate (IMDUR) 30 MG 24 hr tablet Take 1 tablet (30 mg total) by mouth daily. In the evening 90 tablet 3   No current facility-administered medications for this visit.    Past Medical History  Diagnosis Date  . Chronic diastolic CHF (congestive heart failure)     LVEF 60-65%, grade 2 diastolic dysfunction, PASP 54mmHg  . Type 2 diabetes mellitus   . Essential hypertension, benign   . Osteoarthritis   . Obesity   . CKD (chronic kidney disease) stage 2, GFR 60-89 ml/min   . Coronary atherosclerosis of native coronary artery February 05, 2009    NSTEMI s/p DES prox LAD  . Symptomatic bradycardia     12/2011 s/p MDT Sensia DC PPM, [id number] H  . Syncope     Social History Brianna Chandler reports that she has never smoked. She quit smokeless tobacco use about 23 years ago. Her smokeless tobacco use included Snuff. Brianna Chandler reports that she does not drink alcohol.  Review of Systems Complete review of systems negative except as otherwise outlined  in the clinical summary and also the following. Chronic arthritic knee pain which limits her ambulation significantly. She uses a walker.  Physical Examination Filed Vitals:   03/20/14 1042  BP: 150/87  Pulse: 70    Overweight woman, appears comfortable.  HEENT: Conjunctiva and lids normal, oropharynx clear.  Neck: Supple, no elevated JVP, no thyromegaly.  Lungs: Clear to auscultation, nonlabored breathing at rest.  Cardiac: Regular rate and rhythm, no S3, soft systolic murmur.  Abdomen: Soft, nontender, bowel sounds present.  Extremities: Trace edema, distal pulses 2+.  Skin: Warm and dry.  Musculoskeletal: Mild  kyphosis.  Neuropsychiatric: Alert and oriented x3, affect grossly appropriate.   Problem List and Plan   CORONARY ATHEROSCLEROSIS NATIVE CORONARY ARTERY Reporting symptoms suggestive of intermittent angina. Prior history of DES to the LAD in 2010. Ischemic testing from last year was overall low risk. Plan is to continue medical therapy, we will add Imdur 30 mg daily to see if this helps.  Essential hypertension, benign Continues on Coreg, Norvasc, and Cozaar.  Atrial fibrillation Has been maintaining sinus rhythm fairly well per device interrogation. She continues on Xarelto. No palpitations.    Jonelle SidleSamuel G. Dorcus Riga, M.D., F.A.C.C.

## 2014-03-20 NOTE — Patient Instructions (Signed)
Your physician recommends that you schedule a follow-up appointment in: 6 months. You will receive a reminder letter in the mail in about 4 months reminding you to call and schedule your appointment. If you don't receive this letter, please contact our office. Your physician has recommended you make the following change in your medication:  Start isosorbide mononitrate 30 mg in the evening. Continue all other medications the same.

## 2014-03-20 NOTE — Assessment & Plan Note (Signed)
Has been maintaining sinus rhythm fairly well per device interrogation. She continues on Xarelto. No palpitations.

## 2014-04-04 ENCOUNTER — Encounter (HOSPITAL_COMMUNITY): Payer: Self-pay | Admitting: Internal Medicine

## 2014-04-12 ENCOUNTER — Other Ambulatory Visit: Payer: Self-pay | Admitting: Cardiology

## 2014-05-23 ENCOUNTER — Ambulatory Visit (INDEPENDENT_AMBULATORY_CARE_PROVIDER_SITE_OTHER): Payer: Medicare PPO | Admitting: *Deleted

## 2014-05-23 DIAGNOSIS — I480 Paroxysmal atrial fibrillation: Secondary | ICD-10-CM

## 2014-05-23 NOTE — Progress Notes (Signed)
Remote pacemaker transmission.   

## 2014-05-24 LAB — MDC_IDC_ENUM_SESS_TYPE_REMOTE
Battery Impedance: 198 Ohm
Battery Remaining Longevity: 109 mo
Brady Statistic AP VS Percent: 91 %
Brady Statistic AS VP Percent: 0 %
Date Time Interrogation Session: 20160128170256
Lead Channel Impedance Value: 703 Ohm
Lead Channel Pacing Threshold Amplitude: 0.875 V
Lead Channel Pacing Threshold Pulse Width: 0.4 ms
Lead Channel Sensing Intrinsic Amplitude: 11.2 mV
Lead Channel Setting Pacing Amplitude: 2 V
Lead Channel Setting Pacing Amplitude: 2.5 V
Lead Channel Setting Pacing Pulse Width: 0.4 ms
Lead Channel Setting Sensing Sensitivity: 4 mV
MDC IDC MSMT BATTERY VOLTAGE: 2.79 V
MDC IDC MSMT LEADCHNL RA IMPEDANCE VALUE: 393 Ohm
MDC IDC MSMT LEADCHNL RV PACING THRESHOLD AMPLITUDE: 0.625 V
MDC IDC MSMT LEADCHNL RV PACING THRESHOLD PULSEWIDTH: 0.4 ms
MDC IDC STAT BRADY AP VP PERCENT: 0 %
MDC IDC STAT BRADY AS VS PERCENT: 9 %

## 2014-05-29 ENCOUNTER — Encounter: Payer: Self-pay | Admitting: Cardiology

## 2014-07-15 ENCOUNTER — Encounter: Payer: Self-pay | Admitting: Internal Medicine

## 2014-08-12 ENCOUNTER — Other Ambulatory Visit: Payer: Self-pay | Admitting: Internal Medicine

## 2014-08-22 ENCOUNTER — Ambulatory Visit (INDEPENDENT_AMBULATORY_CARE_PROVIDER_SITE_OTHER): Payer: Medicare PPO | Admitting: *Deleted

## 2014-08-22 DIAGNOSIS — I480 Paroxysmal atrial fibrillation: Secondary | ICD-10-CM

## 2014-08-22 NOTE — Progress Notes (Signed)
Remote pacemaker transmission.   

## 2014-08-23 LAB — MDC_IDC_ENUM_SESS_TYPE_REMOTE
Battery Impedance: 223 Ohm
Battery Remaining Longevity: 105 mo
Brady Statistic AP VS Percent: 91 %
Brady Statistic AS VP Percent: 0 %
Date Time Interrogation Session: 20160428154842
Lead Channel Impedance Value: 388 Ohm
Lead Channel Impedance Value: 850 Ohm
Lead Channel Pacing Threshold Amplitude: 0.75 V
Lead Channel Pacing Threshold Amplitude: 0.875 V
Lead Channel Pacing Threshold Pulse Width: 0.4 ms
Lead Channel Setting Pacing Amplitude: 2 V
Lead Channel Setting Pacing Amplitude: 2.5 V
Lead Channel Setting Pacing Pulse Width: 0.4 ms
Lead Channel Setting Sensing Sensitivity: 5.6 mV
MDC IDC MSMT BATTERY VOLTAGE: 2.79 V
MDC IDC MSMT LEADCHNL RV PACING THRESHOLD PULSEWIDTH: 0.4 ms
MDC IDC MSMT LEADCHNL RV SENSING INTR AMPL: 11.2 mV
MDC IDC STAT BRADY AP VP PERCENT: 0 %
MDC IDC STAT BRADY AS VS PERCENT: 9 %

## 2014-09-05 ENCOUNTER — Encounter: Payer: Self-pay | Admitting: Cardiology

## 2014-09-10 ENCOUNTER — Other Ambulatory Visit: Payer: Self-pay | Admitting: Cardiology

## 2014-09-13 ENCOUNTER — Encounter: Payer: Self-pay | Admitting: Internal Medicine

## 2014-09-24 ENCOUNTER — Encounter: Payer: Self-pay | Admitting: *Deleted

## 2014-09-24 ENCOUNTER — Ambulatory Visit (INDEPENDENT_AMBULATORY_CARE_PROVIDER_SITE_OTHER): Payer: Medicare PPO | Admitting: Cardiology

## 2014-09-24 ENCOUNTER — Encounter: Payer: Self-pay | Admitting: Cardiology

## 2014-09-24 VITALS — BP 157/73 | HR 69 | Ht 62.0 in | Wt 212.4 lb

## 2014-09-24 DIAGNOSIS — R079 Chest pain, unspecified: Secondary | ICD-10-CM

## 2014-09-24 DIAGNOSIS — I25119 Atherosclerotic heart disease of native coronary artery with unspecified angina pectoris: Secondary | ICD-10-CM

## 2014-09-24 DIAGNOSIS — Z950 Presence of cardiac pacemaker: Secondary | ICD-10-CM | POA: Diagnosis not present

## 2014-09-24 DIAGNOSIS — I480 Paroxysmal atrial fibrillation: Secondary | ICD-10-CM

## 2014-09-24 DIAGNOSIS — I10 Essential (primary) hypertension: Secondary | ICD-10-CM

## 2014-09-24 NOTE — Patient Instructions (Signed)
Your physician recommends that you continue on your current medications as directed. Please refer to the Current Medication list given to you today. Your physician recommends that you schedule a follow-up appointment in: 6 months. You will receive a reminder letter in the mail in about 4 months reminding you to call and schedule your appointment. If you don't receive this letter, please contact our office. We are requesting your lab work from Dr. Jackolyn Conferapper's office.

## 2014-09-24 NOTE — Progress Notes (Signed)
Cardiology Office Note  Date: 09/24/2014   ID: Brianna Chandler Krol, DOB 06/15/1923, MRN [id number]  PCP: Louie BostonAPPER,DAVID B, MD  Primary Cardiologist: Nona DellSamuel Madsen Riddle, MD   Chief Complaint  Patient presents with  . Coronary Artery Disease    History of Present Illness: Brianna Chandler Sol is an [age] y.o. female last seen in November 2015. She is here today with a family member for follow-up. Overall she reports no major change in symptoms. Specifically, no angina on current medical regimen. She has chronic leg edema, takes Lasix in the morning. We discussed sodium restriction.  She continues to follow in the device clinic with remote transmissions.  Imdur was added the last visit for control of possible angina symptoms. Follow-up ECG is reviewed below.  She had recent lab work with Dr. Margo Commonapper, to be requested.   Past Medical History  Diagnosis Date  . Chronic diastolic CHF (congestive heart failure)     LVEF 60-65%, grade 2 diastolic dysfunction, PASP 54mmHg  . Type 2 diabetes mellitus   . Essential hypertension, benign   . Osteoarthritis   . Obesity   . CKD (chronic kidney disease) stage 2, GFR 60-89 ml/min   . Coronary atherosclerosis of native coronary artery February 05, 2009    NSTEMI s/p DES prox LAD  . Symptomatic bradycardia     12/2011 s/p MDT Sensia DC PPM, [id number] H  . Syncope      Current Outpatient Prescriptions  Medication Sig Dispense Refill  . amLODipine (NORVASC) 5 MG tablet TAKE 1 TABLET BY MOUTH EVERY DAY 30 tablet 6  . atorvastatin (LIPITOR) 40 MG tablet Take 1 tablet (40 mg total) by mouth at bedtime. 30 tablet 3  . carvedilol (COREG) 12.5 MG tablet TAKE 1 & 1/2 TABLETS BY MOUTH TWICE DAILY WITH A MEAL. 90 tablet 3  . furosemide (LASIX) 20 MG tablet Take 20 mg by mouth 2 (two) times daily.     . isosorbide mononitrate (IMDUR) 30 MG 24 hr tablet Take 1 tablet (30 mg total) by mouth daily. In the evening 90 tablet 3  . lisinopril (PRINIVIL,ZESTRIL) 20 MG tablet  Take 20 mg by mouth every morning.     Marland Kitchen. losartan (COZAAR) 50 MG tablet Take 50 mg by mouth every morning.     . metFORMIN (GLUMETZA) 500 MG (MOD) 24 hr tablet Take 500 mg by mouth 2 (two) times daily with a meal.    . Multiple Vitamin (MULTIVITAMIN) tablet Take 1 tablet by mouth daily.      . nitroGLYCERIN (NITROSTAT) 0.4 MG SL tablet Place 0.4 mg under the tongue every 5 (five) minutes x 3 doses as needed for chest pain (MAX 3 TABLETS).     Marland Kitchen. oxybutynin (DITROPAN) 5 MG tablet Take 5 mg by mouth daily.    . Rivaroxaban (XARELTO) 15 MG TABS tablet Take 1 tablet (15 mg total) by mouth daily. 30 tablet 11  . traMADol (ULTRAM) 50 MG tablet Take by mouth every 4 (four) hours as needed.     No current facility-administered medications for this visit.    Allergies:  Review of patient's allergies indicates no known allergies.   Social History: The patient  reports that she has never smoked. She quit smokeless tobacco use about 24 years ago. Her smokeless tobacco use included Snuff. She reports that she does not drink alcohol or use illicit drugs.   ROS:  Please see the history of present illness. Otherwise, complete review of systems is positive for none.  All other systems are reviewed and negative.   Physical Exam: VS:  BP 157/73 mmHg  Pulse 69  Ht  (1.575 m)  Wt 212 lb 6.4 oz (96.344 kg)  BMI 38.84 kg/m2  SpO2 97%, BMI Body mass index is 38.84 kg/(m^2).  Wt Readings from Last 3 Encounters:  09/24/14 212 lb 6.4 oz (96.344 kg)  02/19/14 219 lb (99.338 kg)  09/04/13 202 lb 12.8 oz (91.989 kg)     Overweight woman, appears comfortable. Uses a walker. HEENT: Conjunctiva and lids normal, oropharynx clear.  Neck: Supple, no elevated JVP, no thyromegaly.  Lungs: Clear to auscultation, nonlabored breathing at rest.  Cardiac: Regular rate and rhythm, no S3, soft systolic murmur.  Abdomen: Soft, nontender, bowel sounds present.  Extremities: Trace edema, distal pulses 2+.  Skin:  Warm and dry.  Musculoskeletal: Mild kyphosis.  Neuropsychiatric: Alert and oriented x3, affect grossly appropriate.    ECG: ECG is ordered today and shows an atrial paced rhythm.   Other Studies Reviewed Today:  Lexiscan Cardiolite in January 2014 demonstrated a small to medium sized inferior defect consistent with scar and mild peri-infarct ischemia, LVEF 50%.  Assessment and Plan:  1. CAD status post previous DES to the LAD as outlined, symptomatic with stable medical therapy. Continue observation.  2. History of such medical bradycardia status post [employer] pacemaker, followed by Dr. Ladona Ridgel.  3. Essential hypertension, keep follow-up with Dr. Margo Common. Plan to request most recent lab work for review.  4. Paroxysmal atrial fibrillation, currently with atrial paced rhythm. Continues Xarelto.  Current medicines were reviewed with the patient today.   Orders Placed This Encounter  Procedures  . EKG 12-Lead    Disposition: FU with me in 6 months.   Signed, Jonelle Sidle, MD, [facility] 09/24/2014 11:49 AM    [facility] at [facility] [address] [place], [place], [place] [address] Phone: [phone]; Fax: [phone]

## 2014-11-11 ENCOUNTER — Other Ambulatory Visit: Payer: Self-pay | Admitting: Cardiology

## 2014-11-21 ENCOUNTER — Ambulatory Visit (INDEPENDENT_AMBULATORY_CARE_PROVIDER_SITE_OTHER): Payer: Medicare PPO

## 2014-11-21 DIAGNOSIS — I4891 Unspecified atrial fibrillation: Secondary | ICD-10-CM

## 2014-11-21 DIAGNOSIS — Z950 Presence of cardiac pacemaker: Secondary | ICD-10-CM | POA: Diagnosis not present

## 2014-12-04 LAB — CUP PACEART REMOTE DEVICE CHECK
Battery Remaining Longevity: 109 mo
Battery Voltage: 2.79 V
Brady Statistic AP VP Percent: 0 %
Brady Statistic AP VS Percent: 93 %
Brady Statistic AS VP Percent: 0 %
Brady Statistic AS VS Percent: 7 %
Lead Channel Pacing Threshold Pulse Width: 0.4 ms
Lead Channel Sensing Intrinsic Amplitude: 11.2 mV
Lead Channel Setting Pacing Amplitude: 2 V
Lead Channel Setting Pacing Amplitude: 2.5 V
Lead Channel Setting Sensing Sensitivity: 4 mV
MDC IDC MSMT BATTERY IMPEDANCE: 198 Ohm
MDC IDC MSMT LEADCHNL RA IMPEDANCE VALUE: 398 Ohm
MDC IDC MSMT LEADCHNL RA PACING THRESHOLD AMPLITUDE: 0.875 V
MDC IDC MSMT LEADCHNL RV IMPEDANCE VALUE: 759 Ohm
MDC IDC MSMT LEADCHNL RV PACING THRESHOLD AMPLITUDE: 0.75 V
MDC IDC MSMT LEADCHNL RV PACING THRESHOLD PULSEWIDTH: 0.4 ms
MDC IDC SESS DTM: 20160728141744
MDC IDC SET LEADCHNL RV PACING PULSEWIDTH: 0.4 ms

## 2014-12-04 NOTE — Progress Notes (Signed)
PPM remote received.  

## 2014-12-08 ENCOUNTER — Emergency Department (HOSPITAL_COMMUNITY): Payer: Medicare PPO

## 2014-12-08 ENCOUNTER — Encounter (HOSPITAL_COMMUNITY): Payer: Self-pay | Admitting: *Deleted

## 2014-12-08 ENCOUNTER — Emergency Department (HOSPITAL_COMMUNITY)
Admission: EM | Admit: 2014-12-08 | Discharge: 2014-12-08 | Disposition: A | Discharge status: Home / Self Care | Payer: Medicare PPO | Attending: Emergency Medicine | Admitting: Emergency Medicine

## 2014-12-08 DIAGNOSIS — H9190 Unspecified hearing loss, unspecified ear: Secondary | ICD-10-CM | POA: Insufficient documentation

## 2014-12-08 DIAGNOSIS — E119 Type 2 diabetes mellitus without complications: Secondary | ICD-10-CM | POA: Insufficient documentation

## 2014-12-08 DIAGNOSIS — R110 Nausea: Secondary | ICD-10-CM | POA: Insufficient documentation

## 2014-12-08 DIAGNOSIS — E669 Obesity, unspecified: Secondary | ICD-10-CM | POA: Diagnosis not present

## 2014-12-08 DIAGNOSIS — I5032 Chronic diastolic (congestive) heart failure: Secondary | ICD-10-CM | POA: Diagnosis not present

## 2014-12-08 DIAGNOSIS — R319 Hematuria, unspecified: Secondary | ICD-10-CM | POA: Diagnosis not present

## 2014-12-08 DIAGNOSIS — Z8739 Personal history of other diseases of the musculoskeletal system and connective tissue: Secondary | ICD-10-CM | POA: Insufficient documentation

## 2014-12-08 DIAGNOSIS — R911 Solitary pulmonary nodule: Secondary | ICD-10-CM | POA: Insufficient documentation

## 2014-12-08 DIAGNOSIS — N182 Chronic kidney disease, stage 2 (mild): Secondary | ICD-10-CM | POA: Insufficient documentation

## 2014-12-08 DIAGNOSIS — I129 Hypertensive chronic kidney disease with stage 1 through stage 4 chronic kidney disease, or unspecified chronic kidney disease: Secondary | ICD-10-CM | POA: Diagnosis not present

## 2014-12-08 DIAGNOSIS — I2510 Atherosclerotic heart disease of native coronary artery without angina pectoris: Secondary | ICD-10-CM | POA: Diagnosis not present

## 2014-12-08 DIAGNOSIS — Z79899 Other long term (current) drug therapy: Secondary | ICD-10-CM | POA: Diagnosis not present

## 2014-12-08 LAB — CBC WITH DIFFERENTIAL/PLATELET
BASOS PCT: 0 % (ref 0–1)
Basophils Absolute: 0 10*3/uL (ref 0.0–0.1)
EOS ABS: 0.1 10*3/uL (ref 0.0–0.7)
EOS PCT: 1 % (ref 0–5)
HCT: 40.1 % (ref 36.0–46.0)
HEMOGLOBIN: 12.6 g/dL (ref 12.0–15.0)
LYMPHS ABS: 1.4 10*3/uL (ref 0.7–4.0)
Lymphocytes Relative: 16 % (ref 12–46)
MCH: 28.3 pg (ref 26.0–34.0)
MCHC: 31.4 g/dL (ref 30.0–36.0)
MCV: 89.9 fL (ref 78.0–100.0)
MONO ABS: 0.5 10*3/uL (ref 0.1–1.0)
MONOS PCT: 5 % (ref 3–12)
NEUTROS PCT: 78 % — AB (ref 43–77)
Neutro Abs: 7.2 10*3/uL (ref 1.7–7.7)
Platelets: 121 10*3/uL — ABNORMAL LOW (ref 150–400)
RBC: 4.46 MIL/uL (ref 3.87–5.11)
RDW: 12.8 % (ref 11.5–15.5)
WBC: 9.2 10*3/uL (ref 4.0–10.5)

## 2014-12-08 LAB — BASIC METABOLIC PANEL
ANION GAP: 8 (ref 5–15)
BUN: 19 mg/dL (ref 6–20)
CALCIUM: 9.7 mg/dL (ref 8.9–10.3)
CO2: 28 mmol/L (ref 22–32)
Chloride: 106 mmol/L (ref 101–111)
Creatinine, Ser: 0.85 mg/dL (ref 0.44–1.00)
GFR calc Af Amer: >60 mL/min (ref >60)
GFR, EST NON AFRICAN AMERICAN: 59 mL/min — AB (ref >60)
Glucose, Bld: 125 mg/dL — ABNORMAL HIGH (ref 65–99)
Potassium: 3.7 mmol/L (ref 3.5–5.1)
Sodium: 142 mmol/L (ref 135–145)

## 2014-12-08 LAB — URINE MICROSCOPIC-ADD ON

## 2014-12-08 LAB — URINALYSIS, ROUTINE W REFLEX MICROSCOPIC
Bilirubin Urine: NEGATIVE
GLUCOSE, UA: NEGATIVE mg/dL
KETONES UR: NEGATIVE mg/dL
LEUKOCYTES UA: NEGATIVE
NITRITE: NEGATIVE
Protein, ur: 30 mg/dL — AB
Specific Gravity, Urine: >1.03 — ABNORMAL HIGH (ref 1.005–1.030)
UROBILINOGEN UA: 0.2 mg/dL (ref 0.0–1.0)
pH: 6 (ref 5.0–8.0)

## 2014-12-08 LAB — TROPONIN I

## 2014-12-08 NOTE — ED Notes (Signed)
Pt with sob and took ntg sl x 1 at home, denies cp, + nausea

## 2014-12-08 NOTE — ED Notes (Signed)
Pt denies any sob at this time.  

## 2014-12-08 NOTE — Discharge Instructions (Signed)
If you were given medicines take as directed.  If you are on coumadin or contraceptives realize their levels and effectiveness is altered by many different medicines.  If you have any reaction (rash, tongues swelling, other) to the medicines stop taking and see a physician.   Have chest xray repeated in 3 weeks.   If your blood pressure was elevated in the ER make sure you follow up for management with a primary doctor or return for chest pain, shortness of breath or stroke symptoms.  Please follow up as directed and return to the ER or see a physician for new or worsening symptoms.  Thank you. Filed Vitals:   12/08/14 1230 12/08/14 1235 12/08/14 1245 12/08/14 1300  BP: 147/64 147/64    Pulse: 69 68 71 59  Temp:  97.3 F (36.3 C)    TempSrc:  Oral    Resp: 16 17    Height:  5\' 2"  (1.575 m)    Weight:  213 lb (96.616 kg)    SpO2: 90% 92% 100% 96%    Hematuria Hematuria is blood in your urine. It can be caused by a bladder infection, kidney infection, prostate infection, kidney stone, or cancer of your urinary tract. Infections can usually be treated with medicine, and a kidney stone usually will pass through your urine. If neither of these is the cause of your hematuria, further workup to find out the reason may be needed. It is very important that you tell your health care provider about any blood you see in your urine, even if the blood stops without treatment or happens without causing pain. Blood in your urine that happens and then stops and then happens again can be a symptom of a very serious condition. Also, pain is not a symptom in the initial stages of many urinary cancers. HOME CARE INSTRUCTIONS   Drink lots of fluid, 3-4 quarts a day. If you have been diagnosed with an infection, cranberry juice is especially recommended, in addition to large amounts of water.  Avoid caffeine, tea, and carbonated beverages because they tend to irritate the bladder.  Avoid alcohol because it may  irritate the prostate.  Take all medicines as directed by your health care provider.  If you were prescribed an antibiotic medicine, finish it all even if you start to feel better.  If you have been diagnosed with a kidney stone, follow your health care provider's instructions regarding straining your urine to catch the stone.  Empty your bladder often. Avoid holding urine for long periods of time.  After a bowel movement, women should cleanse front to back. Use each tissue only once.  Empty your bladder before and after sexual intercourse if you are a female. SEEK MEDICAL CARE IF:  You develop back pain.  You have a fever.  You have a feeling of sickness in your stomach (nausea) or vomiting.  Your symptoms are not better in 3 days. Return sooner if you are getting worse. SEEK IMMEDIATE MEDICAL CARE IF:   You develop severe vomiting and are unable to keep the medicine down.  You develop severe back or abdominal pain despite taking your medicines.  You begin passing a large amount of blood or clots in your urine.  You feel extremely weak or faint, or you pass out. MAKE SURE YOU:   Understand these instructions.  Will watch your condition.  Will get help right away if you are not doing well or get worse. Document Released: 04/12/2005 Document Revised: 08/27/2013  Document Reviewed: 12/11/2012 [facility] Patient Information 2015 [place], [place]. This information is not intended to replace advice given to you by your health care provider. Make sure you discuss any questions you have with your health care provider.

## 2014-12-08 NOTE — ED Notes (Signed)
cbg 131, zofran 4 mg iv en route

## 2014-12-08 NOTE — ED Notes (Signed)
MD at bedside. 

## 2014-12-08 NOTE — ED Provider Notes (Signed)
CSN: [id number]     Arrival date & time 12/08/14  1224 History  This chart was scribed for Brianna Ohara, MD by Littie Deeds, ED Scribe. This patient was seen in room APA05/APA05 and the patient's care was started at 12:56 PM.       Chief Complaint  Patient presents with  . Shortness of Breath  . Nausea   The history is provided by the patient. No language interpreter was used.   HPI Comments: Brianna Chandler is a 81 y.o. female with a history of chronic diastolic CHF, DM, HTN, osteoarthritis, and pacemaker who presents to the Emergency Department complaining of nausea that started this morning. Patient also reports having fatigue and dysuria. She reports taking 2 Vicodin this morning when she typically only takes 1. After the nausea began, she took NTG because she was scared. She has had some increase in her baseline leg swelling; she does take a fluid pill. Patient denies chest pain, SOB, abdominal pain, and blood in stool. She also denies any other new medications, new foods, and changes to her routine.  Past Medical History  Diagnosis Date  . Chronic diastolic CHF (congestive heart failure)     LVEF 60-65%, grade 2 diastolic dysfunction, PASP  . Type 2 diabetes mellitus   . Essential hypertension, benign   . Osteoarthritis   . Obesity   . CKD (chronic kidney disease) stage 2, GFR 60-89 ml/min   . Coronary atherosclerosis of native coronary artery February 05, 2009    NSTEMI s/p DES prox LAD  . Symptomatic bradycardia     12/2011 s/p MDT Sensia DC PPM, [id number] H  . Syncope    Past Surgical History  Procedure Laterality Date  . Total abdominal hysterectomy    . Cataract extraction    . Pacemaker insertion    . Slt laser application Left 11/13/2012    Procedure: SLT LASER APPLICATION;  Surgeon: Susa Simmonds, MD;  Location: [facility];  Service: Ophthalmology;  Laterality: Left;  . Permanent pacemaker insertion Left 01/12/2012    Procedure: PERMANENT PACEMAKER INSERTION;   Surgeon: Marinus Maw, MD;  Location: [facility] CATH LAB;  Service: Cardiovascular;  Laterality: Left;   Family History  Problem Relation Age of Onset  . Diabetes    . Hypertension    . Coronary artery disease     Social History  Substance Use Topics  . Smoking status: Never Smoker   . Smokeless tobacco: Former Neurosurgeon    Types: Snuff    Quit date: 04/26/1990  . Alcohol Use: No   OB History    No data available     Review of Systems  Constitutional: Positive for appetite change and fatigue. Negative for fever and chills.  HENT: Negative for congestion.   Eyes: Negative for visual disturbance.  Respiratory: Negative for shortness of breath.   Cardiovascular: Positive for leg swelling. Negative for chest pain.  Gastrointestinal: Negative for vomiting, abdominal pain and blood in stool.  Genitourinary: Positive for dysuria. Negative for flank pain.  Musculoskeletal: Negative for back pain, neck pain and neck stiffness.  Skin: Negative for rash.  Neurological: Negative for light-headedness and headaches.      Allergies  Review of patient's allergies indicates no known allergies.  Home Medications   Prior to Admission medications   Medication Sig Start Date End Date Taking? Authorizing Provider  amLODipine (NORVASC) 5 MG tablet TAKE 1 TABLET BY MOUTH EVERY DAY 09/10/14  Yes Jonelle Sidle, MD  atorvastatin (LIPITOR)  40 MG tablet Take 1 tablet (40 mg total) by mouth at bedtime. 09/14/11  Yes June Leap, MD  carvedilol (COREG) 12.5 MG tablet TAKE 1 AND 1/2 OF A TABLET BY MOUTH TWICE DAILY WITH A MEAL 11/11/14   Jonelle Sidle, MD  furosemide (LASIX) 20 MG tablet Take 20 mg by mouth 2 (two) times daily.     Historical Provider, MD  isosorbide mononitrate (IMDUR) 30 MG 24 hr tablet Take 1 tablet (30 mg total) by mouth daily. In the evening 03/20/14   Jonelle Sidle, MD  lisinopril (PRINIVIL,ZESTRIL) 20 MG tablet Take 20 mg by mouth every morning.     Historical Provider, MD   losartan (COZAAR) 50 MG tablet Take 50 mg by mouth every morning.     Historical Provider, MD  metFORMIN (GLUMETZA) 500 MG (MOD) 24 hr tablet Take 500 mg by mouth 2 (two) times daily with a meal.    Historical Provider, MD  Multiple Vitamin (MULTIVITAMIN) tablet Take 1 tablet by mouth daily.      Historical Provider, MD  nitroGLYCERIN (NITROSTAT) 0.4 MG SL tablet Place 0.4 mg under the tongue every 5 (five) minutes x 3 doses as needed for chest pain (MAX 3 TABLETS).     Historical Provider, MD  oxybutynin (DITROPAN) 5 MG tablet Take 5 mg by mouth daily.    Historical Provider, MD  Rivaroxaban (XARELTO) 15 MG TABS tablet Take 1 tablet (15 mg total) by mouth daily. 01/09/13   Marinus Maw, MD  traMADol (ULTRAM) 50 MG tablet Take by mouth every 4 (four) hours as needed.    Historical Provider, MD   BP 147/64 mmHg  Pulse 59  Temp(Src) 97.3 F (36.3 C) (Oral)  Resp 17  Ht  (1.575 m)  Wt 213 lb (96.616 kg)  BMI 38.95 kg/m2  SpO2 96% Physical Exam  Constitutional: She is oriented to person, place, and time. She appears well-developed and well-nourished. No distress.  HENT:  Head: Normocephalic and atraumatic.  Mouth/Throat: Oropharynx is clear and moist. No oropharyngeal exudate.  Hard of hearing, which is baseline.  Eyes: EOM are normal. Pupils are equal, round, and reactive to light.  Neck: Neck supple.  Cardiovascular: Normal rate, regular rhythm and normal heart sounds.   Pulmonary/Chest: Effort normal. No respiratory distress.  Abdominal: There is no guarding.  No focal tenderness appreciated.  Musculoskeletal:  Mild swelling to both lower extremities.  Neurological: She is alert and oriented to person, place, and time. No cranial nerve deficit.  No obvious arm drift. FNF intact. Equal strength 5+ in upper arms. Equal strength in legs with mild general weakness. Can feel scratching to palpation in all extremities.  Skin: Skin is warm and dry. No rash noted.  No infection  appreciated on feet.  Psychiatric: She has a normal mood and affect. Her behavior is normal.  Nursing note and vitals reviewed.   ED Course  Procedures  DIAGNOSTIC STUDIES: Oxygen Saturation is 92% on [employer], adequate by my interpretation.    COORDINATION OF CARE: 1:03 PM-Discussed treatment plan which includes CXR and labs with patient/guardian at bedside and patient/guardian agreed to plan.    Labs Review Labs Reviewed  CBC WITH DIFFERENTIAL/PLATELET - Abnormal; Notable for the following:    Platelets 121 (*)    Neutrophils Relative % 78 (*)    All other components within normal limits  BASIC METABOLIC PANEL - Abnormal; Notable for the following:    Glucose, Bld 125 (*)  GFR calc non Af Amer 59 (*)    All other components within normal limits  URINALYSIS, ROUTINE W REFLEX MICROSCOPIC (NOT AT [facility]) - Abnormal; Notable for the following:    APPearance HAZY (*)    Specific Gravity, Urine >1.030 (*)    Hgb urine dipstick LARGE (*)    Protein, ur 30 (*)    All other components within normal limits  URINE MICROSCOPIC-ADD ON - Abnormal; Notable for the following:    Bacteria, UA FEW (*)    All other components within normal limits  URINE CULTURE  TROPONIN I    Imaging Review Dg Chest Portable 1 View  12/08/2014   CLINICAL DATA:  81 year old female with history of shortness of breath and headache.  EXAM: PORTABLE CHEST - 1 VIEW  COMPARISON:  Chest x-ray a 01/13/2012.  FINDINGS: 11 mm nodule projecting over the right upper lobe. Lung volumes are normal. No consolidative airspace disease. No pleural effusions. No pneumothorax. Pulmonary vasculature and the cardiomediastinal silhouette are within normal limits. Atherosclerosis in the thoracic aorta. Left-sided pacemaker with lead tips projecting over the expected location of the right atrium and right ventricular apex.  IMPRESSION: 1. No radiographic evidence of acute cardiopulmonary disease. 2. 11 mm nodular density projecting over the  right upper lobe, favored to be related to overlap of the anterior aspect of the right first rib and the posterior aspect of the right fifth rib, as this is similar in appearance to remote prior study 01/11/2012. Followup nonemergent standing PA and lateral chest radiograph is recommended in 2-3 weeks to ensure the stability or resolution of this finding.   Electronically Signed   By: Trudie Reed M.D.   On: 12/08/2014 13:33   I personally reviewed and evaluated these images and lab results as part of my medical decision-making.   EKG Interpretation   Date/Time:  Sunday December 08 2014 12:29:02 EDT Ventricular Rate:  72 PR Interval:    QRS Duration: 125 QT Interval:  410 QTC Calculation: 449 R Axis:   -60 Text Interpretation:  Accelerated junctional rhythm Nonspecific IVCD with  LAD Confirmed by Raysha Tilmon  MD, Jerriyah Louis ([id number]) on 12/08/2014 2:06:29 PM      MDM   Final diagnoses:  Hematuria  Nausea  Lung nodule   Clarified with patient she presents with mild nausea and feeling sick to her stomach since taking an extra Vicodin this morning. Patient has no shortness of breath or chest pain at this time. No fevers or chills. Mild urinary symptoms recently. Plan for screening blood work, cardiac screen and urinalysis. Clinically patient stable for close outpatient follow-up.  Pt workup unremarkable at this time, no indication for admission.   Results and differential diagnosis were discussed with the patient/parent/guardian. Xrays were independently reviewed by myself.  Close follow up outpatient was discussed, comfortable with the plan.   Medications - No data to display  Filed Vitals:   12/08/14 1230 12/08/14 1235 12/08/14 1245 12/08/14 1300  BP: 147/64 147/64    Pulse: 69 68 71 59  Temp:  97.3 F (36.3 C)    TempSrc:  Oral    Resp: 16 17    Height:  5\' 2"  (1.575 m)    Weight:  213 lb (96.616 kg)    SpO2: 90% 92% 100% 96%    Final diagnoses:  Hematuria  Nausea  Lung nodule       Brianna Ohara, MD 12/08/14 1500

## 2014-12-08 NOTE — ED Notes (Signed)
Reported that pt took 2 vicodin at home

## 2014-12-10 LAB — URINE CULTURE: CULTURE: NO GROWTH

## 2014-12-26 ENCOUNTER — Encounter: Payer: Self-pay | Admitting: Cardiology

## 2015-01-06 ENCOUNTER — Encounter: Payer: Self-pay | Admitting: Internal Medicine

## 2015-02-06 ENCOUNTER — Other Ambulatory Visit: Payer: Self-pay | Admitting: Internal Medicine

## 2015-02-21 ENCOUNTER — Encounter: Payer: Self-pay | Admitting: Internal Medicine

## 2015-02-21 ENCOUNTER — Ambulatory Visit (INDEPENDENT_AMBULATORY_CARE_PROVIDER_SITE_OTHER): Payer: Medicare PPO | Admitting: Internal Medicine

## 2015-02-21 VITALS — BP 130/78 | HR 87 | Ht 62.0 in | Wt 209.4 lb

## 2015-02-21 DIAGNOSIS — I4891 Unspecified atrial fibrillation: Secondary | ICD-10-CM

## 2015-02-21 DIAGNOSIS — I5032 Chronic diastolic (congestive) heart failure: Secondary | ICD-10-CM | POA: Diagnosis not present

## 2015-02-21 DIAGNOSIS — Z950 Presence of cardiac pacemaker: Secondary | ICD-10-CM

## 2015-02-21 DIAGNOSIS — I10 Essential (primary) hypertension: Secondary | ICD-10-CM

## 2015-02-21 LAB — CUP PACEART INCLINIC DEVICE CHECK
Battery Impedance: 223 Ohm
Battery Remaining Longevity: 105 mo
Battery Voltage: 2.79 V
Date Time Interrogation Session: 20161028111505
Implantable Lead Implant Date: 20130918
Implantable Lead Implant Date: 20130918
Implantable Lead Location: 753860
Implantable Lead Model: 5076
Lead Channel Impedance Value: 388 Ohm
Lead Channel Pacing Threshold Pulse Width: 0.4 ms
Lead Channel Sensing Intrinsic Amplitude: 4 mV
Lead Channel Setting Pacing Amplitude: 2 V
Lead Channel Setting Pacing Amplitude: 2.5 V
Lead Channel Setting Pacing Pulse Width: 0.4 ms
MDC IDC LEAD LOCATION: 753859
MDC IDC MSMT LEADCHNL RA PACING THRESHOLD AMPLITUDE: 1 V
MDC IDC MSMT LEADCHNL RA PACING THRESHOLD PULSEWIDTH: 0.4 ms
MDC IDC MSMT LEADCHNL RV IMPEDANCE VALUE: 684 Ohm
MDC IDC MSMT LEADCHNL RV PACING THRESHOLD AMPLITUDE: 0.75 V
MDC IDC MSMT LEADCHNL RV SENSING INTR AMPL: 11.2 mV
MDC IDC SET LEADCHNL RV SENSING SENSITIVITY: 5.6 mV
MDC IDC STAT BRADY AP VP PERCENT: 0 %
MDC IDC STAT BRADY AP VS PERCENT: 93 %
MDC IDC STAT BRADY AS VP PERCENT: 0 %
MDC IDC STAT BRADY AS VS PERCENT: 7 %

## 2015-02-21 NOTE — Assessment & Plan Note (Signed)
Her symptoms are class II. She is maintaining a low-sodium diet. She has not required hospitalization for volume overload. No change in medications.

## 2015-02-21 NOTE — Patient Instructions (Signed)
Your physician wants you to follow-up in: 12 months with Dr. Ladona Ridgelaylor. You will receive a reminder letter in the mail two months in advance. If you don't receive a letter, please call our office to schedule the follow-up appointment.  Remote monitoring is used to monitor your Pacemaker of ICD from home. This monitoring reduces the number of office visits required to check your device to one time per year. It allows us to keep an eye on the functioning of your device to ensure it is working properly. You are scheduled for a device check from home on 05/26/15. You may send your transmission at any time that day. If you have a wireless device, the transmission will be sent automatically. After your physician reviews your transmission, you will receive a postcard with your next transmission date.  Your physician recommends that you continue on your current medications as directed. Please refer to the Current Medication list given to you today.  If you need a refill on your cardiac medications before your next appointment, please call your pharmacy.  Thank you for choosing [facility]!

## 2015-02-21 NOTE — Assessment & Plan Note (Signed)
Her Medtronic dual-chamber pacemaker is working normally. We'll plan to recheck in several months. 

## 2015-02-21 NOTE — Assessment & Plan Note (Signed)
She is maintaining sinus rhythm. No change in medications.

## 2015-02-21 NOTE — Progress Notes (Signed)
HPI Brianna Chandler returns today for followup. She is a very pleasant 81 year old woman with morbid obesity, coronary artery disease status post stenting in 2010, with symptomatic bradycardia, status post permanent pacemaker insertion. In the interim, she has been stable. She denies any falls or bleeding. No palpitations.  She has not been in the hospital.  No Known Allergies   Current Outpatient Prescriptions  Medication Sig Dispense Refill  . amLODipine (NORVASC) 5 MG tablet TAKE 1 TABLET BY MOUTH EVERY DAY 30 tablet 6  . atorvastatin (LIPITOR) 40 MG tablet Take 1 tablet (40 mg total) by mouth at bedtime. 30 tablet 3  . carvedilol (COREG) 12.5 MG tablet TAKE 1 AND 1/2 OF A TABLET BY MOUTH TWICE DAILY WITH A MEAL 90 tablet 6  . furosemide (LASIX) 20 MG tablet Take 20 mg by mouth 2 (two) times daily.     . isosorbide mononitrate (IMDUR) 30 MG 24 hr tablet Take 1 tablet (30 mg total) by mouth daily. In the evening 90 tablet 3  . lisinopril (PRINIVIL,ZESTRIL) 20 MG tablet Take 20 mg by mouth every morning.     Marland Kitchen losartan (COZAAR) 50 MG tablet Take 50 mg by mouth every morning.     . metFORMIN (GLUMETZA) 500 MG (MOD) 24 hr tablet Take 500 mg by mouth 2 (two) times daily with a meal.    . Multiple Vitamin (MULTIVITAMIN) tablet Take 1 tablet by mouth daily.      . nitroGLYCERIN (NITROSTAT) 0.4 MG SL tablet Place 0.4 mg under the tongue every 5 (five) minutes x 3 doses as needed for chest pain (MAX 3 TABLETS).     Marland Kitchen oxybutynin (DITROPAN) 5 MG tablet Take 5 mg by mouth daily.    . traMADol (ULTRAM) 50 MG tablet Take by mouth every 4 (four) hours as needed.    Carlena Hurl 15 MG TABS tablet TAKE 1 TABLET BY MOUTH DAILY WITH A MEAL. 30 tablet 3   No current facility-administered medications for this visit.     Past Medical History  Diagnosis Date  . Chronic diastolic CHF (congestive heart failure)     LVEF 60-65%, grade 2 diastolic dysfunction, PASP  . Type 2 diabetes mellitus   . Essential  hypertension, benign   . Osteoarthritis   . Obesity   . CKD (chronic kidney disease) stage 2, GFR 60-89 ml/min   . Coronary atherosclerosis of native coronary artery February 05, 2009    NSTEMI s/p DES prox LAD  . Symptomatic bradycardia     12/2011 s/p MDT Sensia DC PPM, [id number] H  . Syncope     ROS:   All systems reviewed and negative except as noted in the HPI.   Past Surgical History  Procedure Laterality Date  . Total abdominal hysterectomy    . Cataract extraction    . Pacemaker insertion    . Slt laser application Left 11/13/2012    Procedure: SLT LASER APPLICATION;  Surgeon: Susa Simmonds, MD;  Location: [facility];  Service: Ophthalmology;  Laterality: Left;  . Permanent pacemaker insertion Left 01/12/2012    Procedure: PERMANENT PACEMAKER INSERTION;  Surgeon: Marinus Maw, MD;  Location: [facility] CATH LAB;  Service: Cardiovascular;  Laterality: Left;     Family History  Problem Relation Age of Onset  . Diabetes    . Hypertension    . Coronary artery disease       Social History   Social History  . Marital Status: Married    Spouse Name: N/A  .  Number of Children: N/A  . Years of Education: N/A   Occupational History  . Retired    Social History Main Topics  . Smoking status: Never Smoker   . Smokeless tobacco: Former NeurosurgeonUser    Types: Snuff    Quit date: 04/26/1990  . Alcohol Use: No  . Drug Use: No  . Sexual Activity: Not on file   Other Topics Concern  . Not on file   Social History Narrative     BP 130/78 mmHg  Pulse 87  Ht 5\' 2"  (1.575 m)  Wt 209 lb 6.4 oz (94.983 kg)  BMI 38.29 kg/m2  SpO2 92%  Physical Exam:  Obese, elderly appearing 81 year old woman, NAD HEENT: Unremarkable,  Neck:  7 cm JVD, no thyromegally Back:  No CVA tenderness Lungs:  Clear with no wheezes, rales, or rhonchi. HEART:  Regular rate rhythm, no murmurs, no rubs, no clicks Abd:  soft, obese, positive bowel sounds, no organomegally, no rebound, no guarding Ext:  2  plus pulses, no edema, no cyanosis, no clubbing Skin:  No rashes no nodules Neuro:  CN II through XII intact, motor grossly intact  DEVICE  Normal device function.  See PaceArt for details.   Assess/Plan:

## 2015-02-21 NOTE — Assessment & Plan Note (Signed)
Her blood pressure is well controlled. She is encouraged to lose weight and maintain a low-sodium diet. No change in medications.

## 2015-03-10 ENCOUNTER — Other Ambulatory Visit: Payer: Self-pay

## 2015-03-10 ENCOUNTER — Other Ambulatory Visit: Payer: Self-pay | Admitting: Cardiology

## 2015-03-10 MED ORDER — ISOSORBIDE MONONITRATE ER 30 MG PO TB24: 30.0000 mg | ORAL_TABLET | Freq: Every day | ORAL | Status: DC

## 2015-04-01 ENCOUNTER — Encounter: Payer: Self-pay | Admitting: Cardiology

## 2015-04-01 ENCOUNTER — Encounter: Payer: Medicare PPO | Admitting: Cardiology

## 2015-04-01 NOTE — Progress Notes (Signed)
Patient canceled.  This encounter was created in error - please disregard. 

## 2015-04-08 ENCOUNTER — Other Ambulatory Visit: Payer: Self-pay | Admitting: Cardiology

## 2015-04-25 ENCOUNTER — Encounter: Payer: Self-pay | Admitting: Cardiology

## 2015-04-25 ENCOUNTER — Ambulatory Visit (INDEPENDENT_AMBULATORY_CARE_PROVIDER_SITE_OTHER): Payer: Medicare PPO | Admitting: Cardiology

## 2015-04-25 VITALS — BP 148/80 | HR 77 | Ht 62.0 in | Wt 205.4 lb

## 2015-04-25 DIAGNOSIS — I480 Paroxysmal atrial fibrillation: Secondary | ICD-10-CM | POA: Diagnosis not present

## 2015-04-25 DIAGNOSIS — I2510 Atherosclerotic heart disease of native coronary artery without angina pectoris: Secondary | ICD-10-CM | POA: Diagnosis not present

## 2015-04-25 DIAGNOSIS — Z950 Presence of cardiac pacemaker: Secondary | ICD-10-CM

## 2015-04-25 NOTE — Patient Instructions (Signed)
Your physician wants you to follow-up in: 6 months with Dr. McDowell You will receive a reminder letter in the mail two months in advance. If you don't receive a letter, please call our office to schedule the follow-up appointment.  Your physician recommends that you continue on your current medications as directed. Please refer to the Current Medication list given to you today.  Thank you for choosing [facility]!!    

## 2015-04-25 NOTE — Progress Notes (Signed)
Cardiology Office Note  Date: 04/25/2015   ID: Brianna Chandler, DOB 07/19/1923, MRN [id number]  PCP: Louie BostonAPPER,DAVID B, MD  Primary Cardiologist: Nona DellSamuel Phenix Grein, MD   Chief Complaint  Patient presents with  . Coronary Artery Disease    History of Present Illness: Brianna Chandler is an 81 y.o. female last seen in May.  She is here today with a family member for follow-up. She remains stable from a cardiac perspective without any significant angina symptoms , no palpitations or syncope. She reports NYHA class II dyspnea with typical activities.  She continues to follow with Dr. Ladona Ridgelaylor, last seen in October. Medtronic pacer function was normal at that time. 44 mode switches noted.  We reviewed her medications which are stable from a cardiac perspective and outlined below. She does not report any spontaneous bleeding problems on Xarelto. Most recent lab work from Dr. Margo Commonapper is outlined below.  Past Medical History  Diagnosis Date  . Chronic diastolic CHF (congestive heart failure) (HCC)     LVEF 60-65%, grade 2 diastolic dysfunction, PASP 54mmHg  . Type 2 diabetes mellitus (HCC)   . Essential hypertension, benign   . Osteoarthritis   . Obesity   . CKD (chronic kidney disease) stage 2, GFR 60-89 ml/min   . Coronary atherosclerosis of native coronary artery February 05, 2009    NSTEMI s/p DES prox LAD  . Symptomatic bradycardia     12/2011 s/p MDT Sensia DC PPM, [id number] H  . Syncope     Current Outpatient Prescriptions  Medication Sig Dispense Refill  . amLODipine (NORVASC) 5 MG tablet TAKE 1 TABLET BY MOUTH EVERY DAY 30 tablet 6  . atorvastatin (LIPITOR) 40 MG tablet Take 1 tablet (40 mg total) by mouth at bedtime. 30 tablet 3  . carvedilol (COREG) 12.5 MG tablet TAKE 1 AND 1/2 OF A TABLET BY MOUTH TWICE DAILY WITH A MEAL 90 tablet 6  . furosemide (LASIX) 20 MG tablet Take 20 mg by mouth 2 (two) times daily.     . isosorbide mononitrate (IMDUR) 30 MG 24 hr tablet Take 1 tablet  (30 mg total) by mouth daily. In the evening 90 tablet 3  . lisinopril (PRINIVIL,ZESTRIL) 20 MG tablet Take 20 mg by mouth every morning.     Marland Kitchen. losartan (COZAAR) 50 MG tablet Take 50 mg by mouth every morning.     . metFORMIN (GLUMETZA) 500 MG (MOD) 24 hr tablet Take 500 mg by mouth 2 (two) times daily with a meal.    . Multiple Vitamin (MULTIVITAMIN) tablet Take 1 tablet by mouth daily.      . nitroGLYCERIN (NITROSTAT) 0.4 MG SL tablet Place 0.4 mg under the tongue every 5 (five) minutes x 3 doses as needed for chest pain (MAX 3 TABLETS).     Marland Kitchen. oxybutynin (DITROPAN) 5 MG tablet Take 5 mg by mouth daily.    . traMADol (ULTRAM) 50 MG tablet Take by mouth every 4 (four) hours as needed.    Carlena Hurl. XARELTO 15 MG TABS tablet TAKE 1 TABLET BY MOUTH DAILY WITH A MEAL. 30 tablet 3   No current facility-administered medications for this visit.   Allergies:  Review of patient's allergies indicates no known allergies.   Social History: The patient  reports that she has never smoked. She quit smokeless tobacco use about 25 years ago. Her smokeless tobacco use included Snuff. She reports that she does not drink alcohol or use illicit drugs.   ROS:  Please see the  history of present illness. Otherwise, complete review of systems is positive for diminished hearing.  All other systems are reviewed and negative.   Physical Exam: VS:  BP 148/80 mmHg  Pulse 77  Ht  (1.575 m)  Wt 205 lb 6.4 oz (93.169 kg)  BMI 37.56 kg/m2  SpO2 94%, BMI Body mass index is 37.56 kg/(m^2).  Wt Readings from Last 3 Encounters:  04/25/15 205 lb 6.4 oz (93.169 kg)  02/21/15 209 lb 6.4 oz (94.983 kg)  12/08/14 213 lb (96.616 kg)     Overweight woman, appears comfortable. Uses a walker. HEENT: Conjunctiva and lids normal, oropharynx clear.  Neck: Supple, no elevated JVP, no thyromegaly.  Lungs: Clear to auscultation, nonlabored breathing at rest.  Cardiac: Regular rate and rhythm, no S3, soft systolic murmur.   Abdomen: Soft, nontender, bowel sounds present.  Extremities: Trace edema, distal pulses 2+.   ECG: ECG is not ordered today.  Recent Labwork: 12/08/2014: BUN 19; Creatinine, Ser 0.85; Hemoglobin 12.6; Platelets 121*; Potassium 3.7; Sodium 142  September 2016: Cholesterol 101, triglycerides 98, HDL 45, LDL 36  Other Studies Reviewed Today:  Lexiscan Cardiolite in January 2014 demonstrated a small to medium sized inferior defect consistent with scar and mild peri-infarct ischemia, LVEF 50%.  Assessment and Plan:  1. CAD status post DES to the LAD in 2010, abnormal but overall low risk Cardiolite study noted in 2014, however with no active angina symptoms on medical therapy. We continue observation.  2.  Symptomatic bradycardia status post Medtronic pacemaker placement, followed by Dr. Ladona Ridgel.  3. Paroxysmal atrial fibrillation, not overly symptomatic in terms of palpitations. She continues on Xarelto for stroke prophylaxis.  Current medicines were reviewed with the patient today.  Disposition: FU with me in 6 months.   Signed, Jonelle Sidle, MD, [facility] 04/25/2015 1:17 PM    [facility] at [facility] [address] [place], [place], [place] [address] Phone: [phone]; Fax: [phone]

## 2015-05-02 ENCOUNTER — Emergency Department (HOSPITAL_COMMUNITY): Payer: Medicare Other

## 2015-05-02 ENCOUNTER — Emergency Department (HOSPITAL_COMMUNITY)
Admission: EM | Admit: 2015-05-02 | Discharge: 2015-05-02 | Disposition: A | Discharge status: Home / Self Care | Payer: Medicare Other | Attending: Emergency Medicine | Admitting: Emergency Medicine

## 2015-05-02 ENCOUNTER — Encounter (HOSPITAL_COMMUNITY): Payer: Self-pay | Admitting: Emergency Medicine

## 2015-05-02 DIAGNOSIS — N182 Chronic kidney disease, stage 2 (mild): Secondary | ICD-10-CM | POA: Insufficient documentation

## 2015-05-02 DIAGNOSIS — Z8739 Personal history of other diseases of the musculoskeletal system and connective tissue: Secondary | ICD-10-CM | POA: Insufficient documentation

## 2015-05-02 DIAGNOSIS — J209 Acute bronchitis, unspecified: Secondary | ICD-10-CM | POA: Diagnosis not present

## 2015-05-02 DIAGNOSIS — E669 Obesity, unspecified: Secondary | ICD-10-CM | POA: Insufficient documentation

## 2015-05-02 DIAGNOSIS — J40 Bronchitis, not specified as acute or chronic: Secondary | ICD-10-CM

## 2015-05-02 DIAGNOSIS — R079 Chest pain, unspecified: Secondary | ICD-10-CM | POA: Diagnosis present

## 2015-05-02 DIAGNOSIS — E119 Type 2 diabetes mellitus without complications: Secondary | ICD-10-CM | POA: Diagnosis not present

## 2015-05-02 DIAGNOSIS — Z79899 Other long term (current) drug therapy: Secondary | ICD-10-CM | POA: Insufficient documentation

## 2015-05-02 DIAGNOSIS — Z7901 Long term (current) use of anticoagulants: Secondary | ICD-10-CM | POA: Diagnosis not present

## 2015-05-02 DIAGNOSIS — I129 Hypertensive chronic kidney disease with stage 1 through stage 4 chronic kidney disease, or unspecified chronic kidney disease: Secondary | ICD-10-CM | POA: Diagnosis not present

## 2015-05-02 DIAGNOSIS — I5032 Chronic diastolic (congestive) heart failure: Secondary | ICD-10-CM | POA: Diagnosis not present

## 2015-05-02 DIAGNOSIS — I2510 Atherosclerotic heart disease of native coronary artery without angina pectoris: Secondary | ICD-10-CM | POA: Diagnosis not present

## 2015-05-02 DIAGNOSIS — Z7984 Long term (current) use of oral hypoglycemic drugs: Secondary | ICD-10-CM | POA: Diagnosis not present

## 2015-05-02 LAB — URINALYSIS, ROUTINE W REFLEX MICROSCOPIC
Bilirubin Urine: NEGATIVE
GLUCOSE, UA: NEGATIVE mg/dL
Ketones, ur: NEGATIVE mg/dL
Leukocytes, UA: NEGATIVE
Nitrite: NEGATIVE
Protein, ur: 100 mg/dL — AB
SPECIFIC GRAVITY, URINE: 1.02 (ref 1.005–1.030)
pH: 6 (ref 5.0–8.0)

## 2015-05-02 LAB — BASIC METABOLIC PANEL
Anion gap: 11 (ref 5–15)
BUN: 17 mg/dL (ref 6–20)
CO2: 30 mmol/L (ref 22–32)
CREATININE: 0.78 mg/dL (ref 0.44–1.00)
Calcium: 9.5 mg/dL (ref 8.9–10.3)
Chloride: 106 mmol/L (ref 101–111)
GFR calc Af Amer: >60 mL/min (ref >60)
GLUCOSE: 140 mg/dL — AB (ref 65–99)
POTASSIUM: 3.1 mmol/L — AB (ref 3.5–5.1)
Sodium: 147 mmol/L — ABNORMAL HIGH (ref 135–145)

## 2015-05-02 LAB — CBC
HEMATOCRIT: 42.7 % (ref 36.0–46.0)
Hemoglobin: 14 g/dL (ref 12.0–15.0)
MCH: 28.7 pg (ref 26.0–34.0)
MCHC: 32.8 g/dL (ref 30.0–36.0)
MCV: 87.7 fL (ref 78.0–100.0)
Platelets: 159 10*3/uL (ref 150–400)
RBC: 4.87 MIL/uL (ref 3.87–5.11)
RDW: 12.7 % (ref 11.5–15.5)
WBC: 8.9 10*3/uL (ref 4.0–10.5)

## 2015-05-02 LAB — URINE MICROSCOPIC-ADD ON
BACTERIA UA: NONE SEEN
WBC, UA: NONE SEEN WBC/hpf (ref 0–5)

## 2015-05-02 LAB — I-STAT TROPONIN, ED: Troponin i, poc: 0.02 ng/mL (ref 0.00–0.08)

## 2015-05-02 MED ORDER — SODIUM CHLORIDE 0.9 % IV BOLUS (SEPSIS)
1000.0000 mL | Freq: Once | INTRAVENOUS | Status: AC
  Administered 2015-05-02: 1000 mL via INTRAVENOUS

## 2015-05-02 MED ORDER — DOXYCYCLINE HYCLATE 100 MG PO TABS
100.0000 mg | ORAL_TABLET | Freq: Once | ORAL | Status: AC
  Administered 2015-05-02: 100 mg via ORAL
  Filled 2015-05-02: qty 1

## 2015-05-02 MED ORDER — BENZONATATE 100 MG PO CAPS: 100.0000 mg | ORAL_CAPSULE | Freq: Three times a day (TID) | ORAL | Status: DC

## 2015-05-02 MED ORDER — DOXYCYCLINE HYCLATE 100 MG PO CAPS: 100.0000 mg | ORAL_CAPSULE | Freq: Two times a day (BID) | ORAL | Status: DC

## 2015-05-02 NOTE — ED Notes (Signed)
Pt reports cp x2 days with sob and diarrhea, cough and congestion.  Pt has pacemaker, hx of MI.  Pt alert and oriented.

## 2015-05-02 NOTE — ED Provider Notes (Signed)
CSN: [id number]     Arrival date & time 05/02/15  1137 History  By signing my name below, I, Brianna Chandler, attest that this documentation has been prepared under the direction and in the presence of Brianna Bilis, MD. Electronically Signed: Lyndel Chandler, ED Scribe. 05/02/2015. 12:31 PM.   Chief Complaint  Patient presents with  . Chest Pain     HPI Comments: Brianna Chandler is a 82 y.o. female, with a PMhx of chronic diastolic CHF, CAD s/p stenting, DM II, HTN, Afib, and NSTEMI, w/ pacemaker who presents to the Emergency Department complaining of cough with some shortness of breath and diarrhea over the past 48-72 hours.  Family reports it is a productive cough.  Possible chills at home without documented fever.  No reports of vomiting.  Mild decreased oral intake.  No significant confusion.  She did complain of chest discomfort earlier today fell remembers.  She denies chest discomfort at this time.    Past Medical History  Diagnosis Date  . Chronic diastolic CHF (congestive heart failure) (HCC)     LVEF 60-65%, grade 2 diastolic dysfunction, PASP  . Type 2 diabetes mellitus (HCC)   . Essential hypertension, benign   . Osteoarthritis   . Obesity   . CKD (chronic kidney disease) stage 2, GFR 60-89 ml/min   . Coronary atherosclerosis of native coronary artery February 05, 2009    NSTEMI s/p DES prox LAD  . Symptomatic bradycardia     12/2011 s/p MDT Sensia DC PPM, [id number] H  . Syncope    Past Surgical History  Procedure Laterality Date  . Total abdominal hysterectomy    . Cataract extraction    . Pacemaker insertion    . Slt laser application Left 11/13/2012    Procedure: SLT LASER APPLICATION;  Surgeon: Susa Simmonds, MD;  Location: [facility];  Service: Ophthalmology;  Laterality: Left;  . Permanent pacemaker insertion Left 01/12/2012    Procedure: PERMANENT PACEMAKER INSERTION;  Surgeon: Marinus Maw, MD;  Location: [facility] CATH LAB;  Service: Cardiovascular;  Laterality:  Left;   Family History  Problem Relation Age of Onset  . Diabetes    . Hypertension    . Coronary artery disease     Social History  Substance Use Topics  . Smoking status: Never Smoker   . Smokeless tobacco: Former Neurosurgeon    Types: Snuff    Quit date: 04/26/1990  . Alcohol Use: No   OB History    No data available     Review of Systems  Respiratory: Positive for shortness of breath.   Cardiovascular: Positive for chest pain.  All other systems reviewed and are negative.  Allergies  Review of patient's allergies indicates no known allergies.  Home Medications   Prior to Admission medications   Medication Sig Start Date End Date Taking? Authorizing Provider  amLODipine (NORVASC) 5 MG tablet TAKE 1 TABLET BY MOUTH EVERY DAY 04/08/15  Yes Jonelle Sidle, MD  atorvastatin (LIPITOR) 40 MG tablet Take 1 tablet (40 mg total) by mouth at bedtime. 09/14/11  Yes June Leap, MD  carvedilol (COREG) 12.5 MG tablet TAKE 1 AND 1/2 OF A TABLET BY MOUTH TWICE DAILY WITH A MEAL 11/11/14  Yes Jonelle Sidle, MD  furosemide (LASIX) 20 MG tablet Take 20 mg by mouth 2 (two) times daily.    Yes Historical Provider, MD  furosemide (LASIX) 20 MG tablet Take 40-60 mg by mouth daily as needed for fluid. In  addition to maintenance dose   Yes Historical Provider, MD  isosorbide mononitrate (IMDUR) 30 MG 24 hr tablet Take 1 tablet (30 mg total) by mouth daily. In the evening 03/10/15  Yes Jonelle Sidle, MD  lisinopril (PRINIVIL,ZESTRIL) 20 MG tablet Take 20 mg by mouth every morning.    Yes Historical Provider, MD  losartan (COZAAR) 50 MG tablet Take 50 mg by mouth every morning.    Yes Historical Provider, MD  metFORMIN (GLUCOPHAGE) 500 MG tablet Take 500 mg by mouth 2 (two) times daily with a meal.   Yes Historical Provider, MD  Multiple Vitamin (MULTIVITAMIN) tablet Take 1 tablet by mouth daily.     Yes Historical Provider, MD  nitroGLYCERIN (NITROSTAT) 0.4 MG SL tablet Place 0.4 mg under the  tongue every 5 (five) minutes x 3 doses as needed for chest pain (MAX 3 TABLETS).    Yes Historical Provider, MD  oxybutynin (DITROPAN) 5 MG tablet Take 5 mg by mouth daily.   Yes Historical Provider, MD  traMADol (ULTRAM) 50 MG tablet Take by mouth every 4 (four) hours as needed.   Yes Historical Provider, MD  XARELTO 15 MG TABS tablet TAKE 1 TABLET BY MOUTH DAILY WITH A MEAL. 02/06/15  Yes Marinus Maw, MD   BP 189/67 mmHg  Pulse 63  Temp(Src) 98.4 F (36.9 C) (Oral)  Resp 19  Ht 5\' 2"  (1.575 m)  Wt 213 lb (96.616 kg)  BMI 38.95 kg/m2  SpO2 95% Physical Exam  Constitutional: She is oriented to person, place, and time. She appears well-developed and well-nourished. No distress.  HENT:  Head: Normocephalic and atraumatic.  Eyes: EOM are normal.  Neck: Normal range of motion.  Cardiovascular: Normal rate, regular rhythm and normal heart sounds.   Pulmonary/Chest: Effort normal and breath sounds normal.  Abdominal: Soft. She exhibits no distension. There is no tenderness.  Musculoskeletal: Normal range of motion.  Neurological: She is alert and oriented to person, place, and time.  Skin: Skin is warm and dry.  Psychiatric: She has a normal mood and affect. Judgment normal.  Nursing note and vitals reviewed.   ED Course  Procedures  DIAGNOSTIC STUDIES: Oxygen Saturation is 95% on RA, adequate by my interpretation.    COORDINATION OF CARE: 12:08 PM Discussed treatment plan  with pt. Cardiac workup ordered. Pt acknowledges and agrees to plan.   Labs Review Labs Reviewed  BASIC METABOLIC PANEL - Abnormal; Notable for the following:    Sodium 147 (*)    Potassium 3.1 (*)    Glucose, Bld 140 (*)    All other components within normal limits  URINALYSIS, ROUTINE W REFLEX MICROSCOPIC (NOT AT [facility]) - Abnormal; Notable for the following:    Hgb urine dipstick TRACE (*)    Protein, ur 100 (*)    All other components within normal limits  URINE MICROSCOPIC-ADD ON - Abnormal;  Notable for the following:    Squamous Epithelial / LPF 6-30 (*)    All other components within normal limits  CBC  I-STAT TROPOININ, ED    Imaging Review Dg Chest 2 View  05/02/2015  CLINICAL DATA:  Productive cough for several months. Short of breath no fever EXAM: CHEST  2 VIEW COMPARISON:  12/08/2014 FINDINGS: LEFT-sided pacemaker overlies normal cardiac silhouette. No effusion, infiltrate, pneumothorax. Degenerative osteophytosis of the thoracic spine. IMPRESSION: No acute cardiopulmonary process. Electronically Signed   By: Genevive Bi M.D.   On: 05/02/2015 12:57   I have personally reviewed and  evaluated these images and lab results as part of my medical decision-making.   EKG Interpretation   Date/Time:  Friday May 02 2015 11:46:37 EST Ventricular Rate:  68 PR Interval:  51 QRS Duration: 122 QT Interval:  434 QTC Calculation: 462 R Axis:   -61 Text Interpretation:  Atrial-paced rhythm Left bundle branch block  Confirmed by Colbe Viviano  MD, Caryn BeeKEVIN ([id number]) on 05/02/2015 12:07:42 PM      MDM   Final diagnoses:  Bronchitis    Labs, urine, chest x-ray without abnormality.  Rectal temp 99.1.  IV hydrated in the emergency department.  No abnormal vital signs except for mild elevation of her blood pressure.  Patient has been instructed to follow-up with her primary care physician.  I will cover her with doxycycline for possible developing pneumonia versus bacterial bronchitis.  No clear infiltrate noted on chest x-ray today.  She is stable for discharge home from the emergency department today.  Family understands the importance of close primary care follow-up.  They understand to return to the ER for any new or worsening symptoms  I personally performed the services described in this documentation, which was scribed in my presence. The recorded information has been reviewed and is accurate.        Brianna BilisKevin Marlyce Mcdougald, MD 05/02/15 1400

## 2015-05-02 NOTE — Discharge Instructions (Signed)
Upper Respiratory Infection, Adult Most upper respiratory infections (URIs) are a viral infection of the air passages leading to the lungs. A URI affects the nose, throat, and upper air passages. The most common type of URI is nasopharyngitis and is typically referred to as "the common cold." URIs run their course and usually go away on their own. Most of the time, a URI does not require medical attention, but sometimes a bacterial infection in the upper airways can follow a viral infection. This is called a secondary infection. Sinus and middle ear infections are common types of secondary upper respiratory infections. Bacterial pneumonia can also complicate a URI. A URI can worsen asthma and chronic obstructive pulmonary disease (COPD). Sometimes, these complications can require emergency medical care and may be life threatening.  CAUSES Almost all URIs are caused by viruses. A virus is a type of germ and can spread from one person to another.  RISKS FACTORS You may be at risk for a URI if:   You smoke.   You have chronic heart or lung disease.  You have a weakened defense (immune) system.   You are very young or very old.   You have nasal allergies or asthma.  You work in crowded or poorly ventilated areas.  You work in health care facilities or schools. SIGNS AND SYMPTOMS  Symptoms typically develop 2-3 days after you come in contact with a cold virus. Most viral URIs last 7-10 days. However, viral URIs from the influenza virus (flu virus) can last 14-18 days and are typically more severe. Symptoms may include:   Runny or stuffy (congested) nose.   Sneezing.   Cough.   Sore throat.   Headache.   Fatigue.   Fever.   Loss of appetite.   Pain in your forehead, behind your eyes, and over your cheekbones (sinus pain).  Muscle aches.  DIAGNOSIS  Your health care provider may diagnose a URI by:  Physical exam.  Tests to check that your symptoms are not due to  another condition such as:  Strep throat.  Sinusitis.  Pneumonia.  Asthma. TREATMENT  A URI goes away on its own with time. It cannot be cured with medicines, but medicines may be prescribed or recommended to relieve symptoms. Medicines may help:  Reduce your fever.  Reduce your cough.  Relieve nasal congestion. HOME CARE INSTRUCTIONS   Take medicines only as directed by your health care provider.   Gargle warm saltwater or take cough drops to comfort your throat as directed by your health care provider.  Use a warm mist humidifier or inhale steam from a shower to increase air moisture. This may make it easier to breathe.  Drink enough fluid to keep your urine clear or pale yellow.   Eat soups and other clear broths and maintain good nutrition.   Rest as needed.   Return to work when your temperature has returned to normal or as your health care provider advises. You may need to stay home longer to avoid infecting others. You can also use a face mask and careful hand washing to prevent spread of the virus.  Increase the usage of your inhaler if you have asthma.   Do not use any tobacco products, including cigarettes, chewing tobacco, or electronic cigarettes. If you need help quitting, ask your health care provider. PREVENTION  The best way to protect yourself from getting a cold is to practice good hygiene.   Avoid oral or hand contact with people with cold   symptoms.   Wash your hands often if contact occurs.  There is no clear evidence that vitamin C, vitamin E, echinacea, or exercise reduces the chance of developing a cold. However, it is always recommended to get plenty of rest, exercise, and practice good nutrition.  SEEK MEDICAL CARE IF:   You are getting worse rather than better.   Your symptoms are not controlled by medicine.   You have chills.  You have worsening shortness of breath.  You have brown or red mucus.  You have yellow or brown nasal  discharge.  You have pain in your face, especially when you bend forward.  You have a fever.  You have swollen neck glands.  You have pain while swallowing.  You have white areas in the back of your throat. SEEK IMMEDIATE MEDICAL CARE IF:   You have severe or persistent:  Headache.  Ear pain.  Sinus pain.  Chest pain.  You have chronic lung disease and any of the following:  Wheezing.  Prolonged cough.  Coughing up blood.  A change in your usual mucus.  You have a stiff neck.  You have changes in your:  Vision.  Hearing.  Thinking.  Mood. MAKE SURE YOU:   Understand these instructions.  Will watch your condition.  Will get help right away if you are not doing well or get worse.   This information is not intended to replace advice given to you by your health care provider. Make sure you discuss any questions you have with your health care provider.   Document Released: 10/06/2000 Document Revised: 08/27/2014 Document Reviewed: 07/18/2013 Elsevier Interactive Patient Education 2016 [employer]  

## 2015-05-26 ENCOUNTER — Encounter: Payer: Medicare Other | Admitting: *Deleted

## 2015-05-26 ENCOUNTER — Telehealth: Payer: Self-pay | Admitting: Cardiology

## 2015-05-26 NOTE — Telephone Encounter (Signed)
Confirmed remote transmission w/ pt nurse.   

## 2015-05-28 ENCOUNTER — Encounter: Payer: Self-pay | Admitting: Cardiology

## 2015-06-11 ENCOUNTER — Other Ambulatory Visit: Payer: Self-pay | Admitting: *Deleted

## 2015-06-11 MED ORDER — CARVEDILOL 12.5 MG PO TABS: ORAL_TABLET | ORAL | Status: DC

## 2015-06-12 ENCOUNTER — Other Ambulatory Visit: Payer: Self-pay | Admitting: *Deleted

## 2015-06-12 MED ORDER — RIVAROXABAN 15 MG PO TABS: ORAL_TABLET | ORAL | Status: DC

## 2015-06-13 ENCOUNTER — Telehealth: Payer: Self-pay

## 2015-06-13 NOTE — Telephone Encounter (Signed)
Prior auth for Eliquis 5 mg sent to [facility]. 

## 2015-06-29 ENCOUNTER — Inpatient Hospital Stay (HOSPITAL_COMMUNITY)
Admission: EM | Admit: 2015-06-29 | Discharge: 2015-07-04 | DRG: 291 | Disposition: A | Discharge status: Home Health | Payer: Medicare Other | Attending: Internal Medicine | Admitting: Internal Medicine

## 2015-06-29 ENCOUNTER — Emergency Department (HOSPITAL_COMMUNITY): Payer: Medicare Other

## 2015-06-29 ENCOUNTER — Encounter (HOSPITAL_COMMUNITY): Payer: Self-pay | Admitting: *Deleted

## 2015-06-29 DIAGNOSIS — E1122 Type 2 diabetes mellitus with diabetic chronic kidney disease: Secondary | ICD-10-CM | POA: Diagnosis present

## 2015-06-29 DIAGNOSIS — R32 Unspecified urinary incontinence: Secondary | ICD-10-CM | POA: Diagnosis present

## 2015-06-29 DIAGNOSIS — Z7901 Long term (current) use of anticoagulants: Secondary | ICD-10-CM

## 2015-06-29 DIAGNOSIS — I5033 Acute on chronic diastolic (congestive) heart failure: Secondary | ICD-10-CM | POA: Diagnosis present

## 2015-06-29 DIAGNOSIS — Z950 Presence of cardiac pacemaker: Secondary | ICD-10-CM

## 2015-06-29 DIAGNOSIS — Z79899 Other long term (current) drug therapy: Secondary | ICD-10-CM

## 2015-06-29 DIAGNOSIS — E1169 Type 2 diabetes mellitus with other specified complication: Secondary | ICD-10-CM

## 2015-06-29 DIAGNOSIS — E782 Mixed hyperlipidemia: Secondary | ICD-10-CM | POA: Diagnosis present

## 2015-06-29 DIAGNOSIS — I10 Essential (primary) hypertension: Secondary | ICD-10-CM | POA: Diagnosis present

## 2015-06-29 DIAGNOSIS — Z833 Family history of diabetes mellitus: Secondary | ICD-10-CM

## 2015-06-29 DIAGNOSIS — E669 Obesity, unspecified: Secondary | ICD-10-CM | POA: Diagnosis present

## 2015-06-29 DIAGNOSIS — I5021 Acute systolic (congestive) heart failure: Secondary | ICD-10-CM

## 2015-06-29 DIAGNOSIS — Z90710 Acquired absence of both cervix and uterus: Secondary | ICD-10-CM

## 2015-06-29 DIAGNOSIS — I509 Heart failure, unspecified: Secondary | ICD-10-CM

## 2015-06-29 DIAGNOSIS — N201 Calculus of ureter: Secondary | ICD-10-CM | POA: Diagnosis present

## 2015-06-29 DIAGNOSIS — B964 Proteus (mirabilis) (morganii) as the cause of diseases classified elsewhere: Secondary | ICD-10-CM | POA: Diagnosis present

## 2015-06-29 DIAGNOSIS — N390 Urinary tract infection, site not specified: Secondary | ICD-10-CM | POA: Diagnosis present

## 2015-06-29 DIAGNOSIS — E876 Hypokalemia: Secondary | ICD-10-CM | POA: Diagnosis present

## 2015-06-29 DIAGNOSIS — I4891 Unspecified atrial fibrillation: Secondary | ICD-10-CM | POA: Diagnosis present

## 2015-06-29 DIAGNOSIS — Z7984 Long term (current) use of oral hypoglycemic drugs: Secondary | ICD-10-CM

## 2015-06-29 DIAGNOSIS — N136 Pyonephrosis: Secondary | ICD-10-CM | POA: Diagnosis present

## 2015-06-29 DIAGNOSIS — N182 Chronic kidney disease, stage 2 (mild): Secondary | ICD-10-CM | POA: Diagnosis present

## 2015-06-29 DIAGNOSIS — R52 Pain, unspecified: Secondary | ICD-10-CM | POA: Diagnosis not present

## 2015-06-29 DIAGNOSIS — R609 Edema, unspecified: Secondary | ICD-10-CM

## 2015-06-29 DIAGNOSIS — Z8249 Family history of ischemic heart disease and other diseases of the circulatory system: Secondary | ICD-10-CM

## 2015-06-29 DIAGNOSIS — Z87891 Personal history of nicotine dependence: Secondary | ICD-10-CM

## 2015-06-29 DIAGNOSIS — I130 Hypertensive heart and chronic kidney disease with heart failure and stage 1 through stage 4 chronic kidney disease, or unspecified chronic kidney disease: Secondary | ICD-10-CM | POA: Diagnosis not present

## 2015-06-29 DIAGNOSIS — J9601 Acute respiratory failure with hypoxia: Secondary | ICD-10-CM | POA: Diagnosis present

## 2015-06-29 DIAGNOSIS — M25559 Pain in unspecified hip: Secondary | ICD-10-CM

## 2015-06-29 DIAGNOSIS — I2510 Atherosclerotic heart disease of native coronary artery without angina pectoris: Secondary | ICD-10-CM | POA: Diagnosis present

## 2015-06-29 DIAGNOSIS — R319 Hematuria, unspecified: Secondary | ICD-10-CM | POA: Diagnosis present

## 2015-06-29 DIAGNOSIS — E119 Type 2 diabetes mellitus without complications: Secondary | ICD-10-CM

## 2015-06-29 DIAGNOSIS — I252 Old myocardial infarction: Secondary | ICD-10-CM

## 2015-06-29 LAB — I-STAT CHEM 8, ED
BUN: 23 mg/dL — ABNORMAL HIGH (ref 6–20)
CALCIUM ION: 0.95 mmol/L — AB (ref 1.13–1.30)
CHLORIDE: 107 mmol/L (ref 101–111)
CREATININE: 0.8 mg/dL (ref 0.44–1.00)
GLUCOSE: 122 mg/dL — AB (ref 65–99)
HCT: 34 % — ABNORMAL LOW (ref 36.0–46.0)
Hemoglobin: 11.6 g/dL — ABNORMAL LOW (ref 12.0–15.0)
Potassium: 5.7 mmol/L — ABNORMAL HIGH (ref 3.5–5.1)
SODIUM: 141 mmol/L (ref 135–145)
TCO2: 26 mmol/L (ref 0–100)

## 2015-06-29 LAB — GLUCOSE, CAPILLARY
GLUCOSE-CAPILLARY: 121 mg/dL — AB (ref 65–99)
GLUCOSE-CAPILLARY: 241 mg/dL — AB (ref 65–99)

## 2015-06-29 LAB — URINE MICROSCOPIC-ADD ON
Bacteria, UA: NONE SEEN
Squamous Epithelial / LPF: NONE SEEN
WBC UA: NONE SEEN WBC/hpf (ref 0–5)

## 2015-06-29 LAB — COMPREHENSIVE METABOLIC PANEL
ALBUMIN: 3.4 g/dL — AB (ref 3.5–5.0)
ALT: 19 U/L (ref 14–54)
AST: 28 U/L (ref 15–41)
Alkaline Phosphatase: 65 U/L (ref 38–126)
Anion gap: 9 (ref 5–15)
BILIRUBIN TOTAL: 1.4 mg/dL — AB (ref 0.3–1.2)
BUN: 17 mg/dL (ref 6–20)
CALCIUM: 8.8 mg/dL — AB (ref 8.9–10.3)
CHLORIDE: 107 mmol/L (ref 101–111)
CO2: 28 mmol/L (ref 22–32)
CREATININE: 0.74 mg/dL (ref 0.44–1.00)
GFR calc Af Amer: >60 mL/min (ref >60)
GFR calc non Af Amer: >60 mL/min (ref >60)
GLUCOSE: 123 mg/dL — AB (ref 65–99)
POTASSIUM: 3.2 mmol/L — AB (ref 3.5–5.1)
Sodium: 144 mmol/L (ref 135–145)
Total Protein: 6 g/dL — ABNORMAL LOW (ref 6.5–8.1)

## 2015-06-29 LAB — CBC WITH DIFFERENTIAL/PLATELET
BASOS ABS: 0 10*3/uL (ref 0.0–0.1)
BASOS PCT: 0 %
Eosinophils Absolute: 0 10*3/uL (ref 0.0–0.7)
Eosinophils Relative: 0 %
HEMATOCRIT: 38.2 % (ref 36.0–46.0)
Hemoglobin: 12.4 g/dL (ref 12.0–15.0)
Lymphocytes Relative: 3 %
Lymphs Abs: 0.4 10*3/uL — ABNORMAL LOW (ref 0.7–4.0)
MCH: 28.7 pg (ref 26.0–34.0)
MCHC: 32.5 g/dL (ref 30.0–36.0)
MCV: 88.4 fL (ref 78.0–100.0)
MONO ABS: 0.4 10*3/uL (ref 0.1–1.0)
Monocytes Relative: 4 %
NEUTROS ABS: 10.5 10*3/uL — AB (ref 1.7–7.7)
NEUTROS PCT: 93 %
Platelets: 106 10*3/uL — ABNORMAL LOW (ref 150–400)
RBC: 4.32 MIL/uL (ref 3.87–5.11)
RDW: 12.8 % (ref 11.5–15.5)
WBC: 11.3 10*3/uL — AB (ref 4.0–10.5)

## 2015-06-29 LAB — URINALYSIS, ROUTINE W REFLEX MICROSCOPIC
Bilirubin Urine: NEGATIVE
GLUCOSE, UA: NEGATIVE mg/dL
Ketones, ur: NEGATIVE mg/dL
LEUKOCYTES UA: NEGATIVE
Nitrite: NEGATIVE
PH: 6.5 (ref 5.0–8.0)
PROTEIN: 100 mg/dL — AB
Specific Gravity, Urine: 1.015 (ref 1.005–1.030)

## 2015-06-29 LAB — INFLUENZA PANEL BY PCR (TYPE A & B)
H1N1 flu by pcr: NOT DETECTED
Influenza A By PCR: NEGATIVE
Influenza B By PCR: NEGATIVE

## 2015-06-29 LAB — BRAIN NATRIURETIC PEPTIDE: B Natriuretic Peptide: 296 pg/mL — ABNORMAL HIGH (ref 0.0–100.0)

## 2015-06-29 LAB — TROPONIN I

## 2015-06-29 MED ORDER — DEXTROSE 5 % IV SOLN
1.0000 g | INTRAVENOUS | Status: DC
  Administered 2015-06-30: 1 g via INTRAVENOUS
  Filled 2015-06-29 (×2): qty 10

## 2015-06-29 MED ORDER — ONDANSETRON HCL 4 MG PO TABS: 4.0000 mg | ORAL_TABLET | Freq: Four times a day (QID) | ORAL | Status: DC | PRN

## 2015-06-29 MED ORDER — SODIUM CHLORIDE 0.9 % IV SOLN: 250.0000 mL | INTRAVENOUS | Status: DC | PRN

## 2015-06-29 MED ORDER — CARVEDILOL 3.125 MG PO TABS
18.7500 mg | ORAL_TABLET | Freq: Two times a day (BID) | ORAL | Status: DC
  Administered 2015-06-29 – 2015-06-30 (×3): 18.75 mg via ORAL
  Filled 2015-06-29 (×4): qty 2

## 2015-06-29 MED ORDER — TRAMADOL HCL 50 MG PO TABS
50.0000 mg | ORAL_TABLET | Freq: Four times a day (QID) | ORAL | Status: DC | PRN
  Administered 2015-06-29 – 2015-06-30 (×2): 50 mg via ORAL
  Filled 2015-06-29 (×2): qty 1

## 2015-06-29 MED ORDER — APIXABAN 5 MG PO TABS
2.5000 mg | ORAL_TABLET | Freq: Two times a day (BID) | ORAL | Status: DC
  Administered 2015-06-29 – 2015-07-04 (×10): 2.5 mg via ORAL
  Filled 2015-06-29 (×10): qty 1

## 2015-06-29 MED ORDER — FUROSEMIDE 10 MG/ML IJ SOLN
40.0000 mg | Freq: Once | INTRAMUSCULAR | Status: AC
  Administered 2015-06-29: 40 mg via INTRAVENOUS
  Filled 2015-06-29: qty 4

## 2015-06-29 MED ORDER — SODIUM CHLORIDE 0.9 % IV SOLN
INTRAVENOUS | Status: DC
  Administered 2015-06-29: 18:00:00 via INTRAVENOUS

## 2015-06-29 MED ORDER — POTASSIUM CHLORIDE CRYS ER 20 MEQ PO TBCR
40.0000 meq | EXTENDED_RELEASE_TABLET | Freq: Once | ORAL | Status: AC
  Administered 2015-06-29: 40 meq via ORAL
  Filled 2015-06-29: qty 2

## 2015-06-29 MED ORDER — ONDANSETRON 4 MG PO TBDP
4.0000 mg | ORAL_TABLET | Freq: Once | ORAL | Status: AC
  Administered 2015-06-29: 4 mg via ORAL
  Filled 2015-06-29: qty 1

## 2015-06-29 MED ORDER — ATORVASTATIN CALCIUM 40 MG PO TABS
40.0000 mg | ORAL_TABLET | Freq: Every day | ORAL | Status: DC
  Administered 2015-06-29 – 2015-07-03 (×5): 40 mg via ORAL
  Filled 2015-06-29 (×5): qty 1

## 2015-06-29 MED ORDER — LOSARTAN POTASSIUM 50 MG PO TABS
50.0000 mg | ORAL_TABLET | Freq: Every morning | ORAL | Status: DC
  Administered 2015-06-30: 50 mg via ORAL
  Filled 2015-06-29 (×2): qty 1

## 2015-06-29 MED ORDER — FUROSEMIDE 10 MG/ML IJ SOLN
20.0000 mg | Freq: Two times a day (BID) | INTRAMUSCULAR | Status: DC
  Administered 2015-06-29: 20 mg via INTRAVENOUS
  Filled 2015-06-29: qty 2

## 2015-06-29 MED ORDER — ISOSORBIDE MONONITRATE ER 60 MG PO TB24
30.0000 mg | ORAL_TABLET | Freq: Every day | ORAL | Status: DC
  Administered 2015-06-29 – 2015-06-30 (×2): 30 mg via ORAL
  Filled 2015-06-29 (×2): qty 1

## 2015-06-29 MED ORDER — OXYBUTYNIN CHLORIDE ER 5 MG PO TB24
5.0000 mg | ORAL_TABLET | Freq: Every day | ORAL | Status: DC
  Administered 2015-06-30 – 2015-07-04 (×5): 5 mg via ORAL
  Filled 2015-06-29 (×5): qty 1

## 2015-06-29 MED ORDER — SODIUM CHLORIDE 0.9 % IV BOLUS (SEPSIS)
250.0000 mL | Freq: Once | INTRAVENOUS | Status: AC
  Administered 2015-06-29: 250 mL via INTRAVENOUS

## 2015-06-29 MED ORDER — ACETAMINOPHEN 325 MG PO TABS
650.0000 mg | ORAL_TABLET | Freq: Once | ORAL | Status: AC
  Administered 2015-06-29: 650 mg via ORAL
  Filled 2015-06-29: qty 2

## 2015-06-29 MED ORDER — SODIUM CHLORIDE 0.9% FLUSH: 3.0000 mL | INTRAVENOUS | Status: DC | PRN

## 2015-06-29 MED ORDER — SODIUM CHLORIDE 0.9 % IV BOLUS (SEPSIS)
250.0000 mL | Freq: Once | INTRAVENOUS | Status: AC
Start: 2015-06-29 — End: 2015-06-29
  Administered 2015-06-29: 250 mL via INTRAVENOUS

## 2015-06-29 MED ORDER — POTASSIUM CHLORIDE CRYS ER 20 MEQ PO TBCR: 40.0000 meq | EXTENDED_RELEASE_TABLET | Freq: Once | ORAL | Status: AC

## 2015-06-29 MED ORDER — TRAMADOL HCL 50 MG PO TABS
50.0000 mg | ORAL_TABLET | Freq: Once | ORAL | Status: AC
  Administered 2015-06-29: 50 mg via ORAL
  Filled 2015-06-29: qty 1

## 2015-06-29 MED ORDER — SODIUM CHLORIDE 0.9 % IV BOLUS (SEPSIS): 1000.0000 mL | Freq: Once | INTRAVENOUS | Status: DC

## 2015-06-29 MED ORDER — ONDANSETRON HCL 4 MG/2ML IJ SOLN
4.0000 mg | Freq: Four times a day (QID) | INTRAMUSCULAR | Status: DC | PRN
  Administered 2015-06-30: 4 mg via INTRAVENOUS
  Filled 2015-06-29: qty 2

## 2015-06-29 MED ORDER — SODIUM CHLORIDE 0.9% FLUSH
3.0000 mL | Freq: Two times a day (BID) | INTRAVENOUS | Status: DC
  Administered 2015-06-30 – 2015-07-04 (×7): 3 mL via INTRAVENOUS

## 2015-06-29 MED ORDER — DEXTROSE 5 % IV SOLN
1.0000 g | Freq: Once | INTRAVENOUS | Status: AC
  Administered 2015-06-29: 1 g via INTRAVENOUS
  Filled 2015-06-29: qty 10

## 2015-06-29 MED ORDER — KETOROLAC TROMETHAMINE 30 MG/ML IJ SOLN
15.0000 mg | Freq: Once | INTRAMUSCULAR | Status: AC
  Administered 2015-06-29: 15 mg via INTRAVENOUS
  Filled 2015-06-29: qty 1

## 2015-06-29 MED ORDER — INSULIN ASPART 100 UNIT/ML ~~LOC~~ SOLN
0.0000 [IU] | Freq: Three times a day (TID) | SUBCUTANEOUS | Status: DC
  Administered 2015-06-29 – 2015-06-30 (×2): 1 [IU] via SUBCUTANEOUS
  Administered 2015-06-30 – 2015-07-01 (×4): 2 [IU] via SUBCUTANEOUS
  Administered 2015-07-02: 3 [IU] via SUBCUTANEOUS
  Administered 2015-07-02 – 2015-07-03 (×4): 2 [IU] via SUBCUTANEOUS
  Administered 2015-07-03: 3 [IU] via SUBCUTANEOUS
  Administered 2015-07-04 (×2): 2 [IU] via SUBCUTANEOUS

## 2015-06-29 NOTE — ED Notes (Signed)
Patient brought in via EMS from home. Alert and oriented. Airway patent. Patient c/o body aches with dysuria and foul odor to urine. Per patient noticed color and odor yesterday. Patient reports taking tramadol this morning for pain. Patient started to have nausea and vomiting prior to arriving to ER per EMS personal. Patient unsure of any fevers.

## 2015-06-29 NOTE — ED Provider Notes (Signed)
CSN: [id number]     Arrival date & time 06/29/15  1007 History  By signing my name below, I, Budd Palmer, attest that this documentation has been prepared under the direction and in the presence of Bethann Berkshire, MD. Electronically Signed: Budd Palmer, ED Scribe. 06/29/2015. 10:38 AM.    Chief Complaint  Patient presents with  . Generalized Body Aches   Patient is a 82 y.o. female presenting with musculoskeletal pain. The history is provided by the patient and the EMS personnel. No language interpreter was used.  Muscle Pain This is a new problem. The current episode started less than 1 hour ago. The problem occurs constantly. The problem has not changed since onset.Pertinent negatives include no abdominal pain. Nothing relieves the symptoms.   HPI Comments: Brianna Chandler is a 82 y.o. female with a PMHX of chronic diastolic CHF, coronary atherosclerosis, symptomatic bradycardia, CKD, essential HTN, and type 2 DM as well as a PSHx of hysterectomy and permanent pacemaker insertion brought in by ambulance, who presents to the Emergency Department complaining of generalized body aches onset this morning. She reports associated dysuria and malodorous urine onset yesterday, as well as cough, nausea, vomiting, and mild fever. She has taken tramadol this morning for pain without relief. She states she lives at home with her spouse. Pt denies abdominal pain.   Past Medical History  Diagnosis Date  . Chronic diastolic CHF (congestive heart failure) (HCC)     LVEF 60-65%, grade 2 diastolic dysfunction, PASP  . Type 2 diabetes mellitus (HCC)   . Essential hypertension, benign   . Osteoarthritis   . Obesity   . CKD (chronic kidney disease) stage 2, GFR 60-89 ml/min   . Coronary atherosclerosis of native coronary artery February 05, 2009    NSTEMI s/p DES prox LAD  . Symptomatic bradycardia     12/2011 s/p MDT Sensia DC PPM, [id number] H  . Syncope    Past Surgical History  Procedure  Laterality Date  . Total abdominal hysterectomy    . Cataract extraction    . Pacemaker insertion    . Slt laser application Left 11/13/2012    Procedure: SLT LASER APPLICATION;  Surgeon: Susa Simmonds, MD;  Location: [facility];  Service: Ophthalmology;  Laterality: Left;  . Permanent pacemaker insertion Left 01/12/2012    Procedure: PERMANENT PACEMAKER INSERTION;  Surgeon: Marinus Maw, MD;  Location: [facility] CATH LAB;  Service: Cardiovascular;  Laterality: Left;   Family History  Problem Relation Age of Onset  . Diabetes    . Hypertension    . Coronary artery disease     Social History  Substance Use Topics  . Smoking status: Never Smoker   . Smokeless tobacco: Former Neurosurgeon    Types: Snuff    Quit date: 04/26/1990  . Alcohol Use: No   OB History    No data available     Review of Systems  Constitutional: Positive for fever. Negative for appetite change and fatigue.  HENT: Negative for congestion, ear discharge and sinus pressure.   Eyes: Negative for discharge.  Respiratory: Positive for cough.   Gastrointestinal: Positive for nausea and vomiting. Negative for abdominal pain and diarrhea.  Genitourinary: Positive for dysuria. Negative for frequency and hematuria.  Musculoskeletal: Positive for myalgias. Negative for back pain.  Skin: Negative for rash.  Neurological: Negative for seizures.  Psychiatric/Behavioral: Negative for hallucinations.    Allergies  Review of patient's allergies indicates no known allergies.  Home Medications   Prior  to Admission medications   Medication Sig Start Date End Date Taking? Authorizing Provider  amLODipine (NORVASC) 5 MG tablet TAKE 1 TABLET BY MOUTH EVERY DAY 04/08/15   Jonelle Sidle, MD  atorvastatin (LIPITOR) 40 MG tablet Take 1 tablet (40 mg total) by mouth at bedtime. 09/14/11   June Leap, MD  benzonatate (TESSALON) 100 MG capsule Take 1 capsule (100 mg total) by mouth every 8 (eight) hours. 05/02/15   [name], MD   carvedilol (COREG) 12.5 MG tablet TAKE 1 AND 1/2 OF A TABLET BY MOUTH TWICE DAILY WITH A MEAL 06/11/15   Jonelle Sidle, MD  doxycycline (VIBRAMYCIN) 100 MG capsule Take 1 capsule (100 mg total) by mouth 2 (two) times daily. 05/02/15   [name], MD  furosemide (LASIX) 20 MG tablet Take 20 mg by mouth 2 (two) times daily.     Historical Provider, MD  furosemide (LASIX) 20 MG tablet Take 40-60 mg by mouth daily as needed for fluid. In addition to maintenance dose    Historical Provider, MD  isosorbide mononitrate (IMDUR) 30 MG 24 hr tablet Take 1 tablet (30 mg total) by mouth daily. In the evening 03/10/15   Jonelle Sidle, MD  lisinopril (PRINIVIL,ZESTRIL) 20 MG tablet Take 20 mg by mouth every morning.     Historical Provider, MD  losartan (COZAAR) 50 MG tablet Take 50 mg by mouth every morning.     Historical Provider, MD  metFORMIN (GLUCOPHAGE) 500 MG tablet Take 500 mg by mouth 2 (two) times daily with a meal.    Historical Provider, MD  Multiple Vitamin (MULTIVITAMIN) tablet Take 1 tablet by mouth daily.      Historical Provider, MD  nitroGLYCERIN (NITROSTAT) 0.4 MG SL tablet Place 0.4 mg under the tongue every 5 (five) minutes x 3 doses as needed for chest pain (MAX 3 TABLETS).     Historical Provider, MD  oxybutynin (DITROPAN) 5 MG tablet Take 5 mg by mouth daily.    Historical Provider, MD  Rivaroxaban (XARELTO) 15 MG TABS tablet TAKE 1 TABLET BY MOUTH DAILY WITH A MEAL. 06/12/15   Marinus Maw, MD  traMADol (ULTRAM) 50 MG tablet Take by mouth every 4 (four) hours as needed.    Historical Provider, MD   BP 165/56 mmHg  Pulse 59  Temp(Src) 100.1 F (37.8 C) (Oral)  Resp 21  Ht  (1.575 m)  Wt 213 lb (96.616 kg)  BMI 38.95 kg/m2  SpO2 97% Physical Exam  Constitutional: She is oriented to person, place, and time. She appears well-developed.  HENT:  Head: Normocephalic.  Dry mucous membranes  Eyes: Conjunctivae and EOM are normal. No scleral icterus.  Neck: Neck supple.  No thyromegaly present.  Cardiovascular: Normal rate and regular rhythm.  Exam reveals no gallop and no friction rub.   No murmur heard. Pulmonary/Chest: No stridor. She has no wheezes. She has no rales. She exhibits no tenderness.  Abdominal: She exhibits no distension. There is no tenderness. There is no rebound.  Musculoskeletal: Normal range of motion. She exhibits no edema.  Lymphadenopathy:    She has no cervical adenopathy.  Neurological: She is oriented to person, place, and time. She exhibits normal muscle tone. Coordination normal.  Skin: No rash noted. No erythema.  Psychiatric: She has a normal mood and affect. Her behavior is normal.    ED Course  Procedures  DIAGNOSTIC STUDIES: Oxygen Saturation is 86% on RA, low by my interpretation.  COORDINATION OF CARE: 10:31 AM - Discussed plans to order diagnostic studies and imaging. Pt advised of plan for treatment and pt agrees.  Labs Review Labs Reviewed  URINALYSIS, ROUTINE W REFLEX [facility]CR[facility]COPIC ([facility]) - Abnormal; Notable for the following:    APPearance CLOUDY (*)    Hgb urine dipstick LARGE (*)    Protein, ur 100 (*)    All other components within normal limits  I-STAT CHEM 8, ED - Abnormal; Notable for the following:    Potassium 5.7 (*)    BUN 23 (*)    Glucose, Bld 122 (*)    Calcium, Ion 0.95 (*)    Hemoglobin 11.6 (*)    HCT 34.0 (*)    All other components within normal limits  URINE MICROSCOPIC-ADD ON  CBC WITH DIFFERENTIAL/PLATELET  COMPREHENSIVE METABOLIC PANEL  INFLUENZA PANEL BY PCR (TYPE A & B, H1N1)  BRAIN NATRIURETIC PEPTIDE  I-STAT CG4 LACTIC ACID, ED    Imaging Review Dg Chest Portable 1 View  06/29/2015  CLINICAL DATA:  Arrived Via EMS. Body aches, discs urea and foul-smelling urine EXAM: PORTABLE CHEST 1 VIEW COMPARISON:  05/02/2015 FINDINGS: There is a left chest wall ICD with leads in the right atrial appendage and right ventricle. Moderate cardiac enlargement is noted. Aortic  atherosclerosis noted There is mild interstitial edema. No significant pleural effusion or pneumothorax. No airspace consolidation. IMPRESSION: Cardiac enlargement and pulmonary edema. Electronically Signed   By: Signa Kellaylor  Stroud M.D.   On: 06/29/2015 10:49   I have personally reviewed and evaluated these images and lab results as part of my medical decision-making.   EKG Interpretation None      MDM   Final diagnoses:  None   patient with urinary tract infection and congestive heart failure. She will be admitted for diuresis and uti tx  The chart was scribed for me under my direct supervision.  I personally performed the history, physical, and medical decision making and all procedures in the evaluation of this patient..  The chart was scribed for me under my direct supervision.  I personally performed the history, physical, and medical decision making and all procedures in the evaluation of this patient.Bethann Berkshire.    Irean Kendricks, MD 07/02/15 1500

## 2015-06-29 NOTE — ED Notes (Signed)
Lab unable to obtain blood at this time. MD notified. Dr. Estell HarpinZammit attempted femoral stick with small amount of blood withdrawal. Only enough blood to run 1 tube, MD gave order to run Istat Chem 8. Another phlebotomist coming to attempt another blood draw.

## 2015-06-29 NOTE — H&P (Signed)
PCP:   Louie BostonAPPER,DAVID B, MD   Chief Complaint:  Generalized body aches  HPI: 82 year old female who   has a past medical history of Chronic diastolic CHF (congestive heart failure) (HCC); Type 2 diabetes mellitus (HCC); Essential hypertension, benign; Osteoarthritis; Obesity; CKD (chronic kidney disease) stage 2, GFR 60-89 ml/min; Coronary atherosclerosis of native coronary artery (February 05, 2009); Symptomatic bradycardia; and Syncope. Today presents to the hospital with chief complaint of generalized body aches since yesterday. Patient also had 2 episodes of vomiting. Has been complaining of dark colored urine. Patient also found to have mild fever. In the ED UA revealed hematuria and low-grade temperature 100.1. Patient took tramadol this morning without relief. In the ED patient was found to be hypoxic with O2 sats 87% on room air on ambulation. Chest x-ray showed pulmonary edema. She received 1 dose of Lasix, BNP 296. Echo done in 2013 showed grade 2 diastolic dysfunction.   Allergies:  No Known Allergies    Past Medical History  Diagnosis Date  . Chronic diastolic CHF (congestive heart failure) (HCC)     LVEF 60-65%, grade 2 diastolic dysfunction, PASP 54mmHg  . Type 2 diabetes mellitus (HCC)   . Essential hypertension, benign   . Osteoarthritis   . Obesity   . CKD (chronic kidney disease) stage 2, GFR 60-89 ml/min   . Coronary atherosclerosis of native coronary artery February 05, 2009    NSTEMI s/p DES prox LAD  . Symptomatic bradycardia     12/2011 s/p MDT Sensia DC PPM, [id number] H  . Syncope     Past Surgical History  Procedure Laterality Date  . Total abdominal hysterectomy    . Cataract extraction    . Pacemaker insertion    . Slt laser application Left 11/13/2012    Procedure: SLT LASER APPLICATION;  Surgeon: Susa Simmondsarroll F Haines, MD;  Location: [facility];  Service: Ophthalmology;  Laterality: Left;  . Permanent pacemaker insertion Left 01/12/2012    Procedure:  PERMANENT PACEMAKER INSERTION;  Surgeon: Marinus MawGregg W Taylor, MD;  [facility];  Service: Cardiovascular;  Laterality: Left;    Prior to Admission medications   Medication Sig Start Date End Date Taking? Authorizing Provider  amLODipine (NORVASC) 5 MG tablet TAKE 1 TABLET BY MOUTH EVERY DAY 04/08/15  Yes Jonelle SidleSamuel G McDowell, MD  atorvastatin (LIPITOR) 40 MG tablet Take 1 tablet (40 mg total) by mouth at bedtime. 09/14/11  Yes June LeapGuy E de Gent, MD  carvedilol (COREG) 12.5 MG tablet TAKE 1 AND 1/2 OF A TABLET BY MOUTH TWICE DAILY WITH A MEAL 06/11/15  Yes Jonelle SidleSamuel G McDowell, MD  ELIQUIS 2.5 MG TABS tablet Take 2.5 mg by mouth 2 (two) times daily. 06/03/15  Yes Historical Provider, MD  isosorbide mononitrate (IMDUR) 30 MG 24 hr tablet Take 1 tablet (30 mg total) by mouth daily. In the evening 03/10/15  Yes Jonelle SidleSamuel G McDowell, MD  losartan (COZAAR) 50 MG tablet Take 50 mg by mouth every morning.    Yes Historical Provider, MD  metFORMIN (GLUCOPHAGE) 500 MG tablet Take 500 mg by mouth 2 (two) times daily with a meal.   Yes Historical Provider, MD  Multiple Vitamin (MULTIVITAMIN) tablet Take 1 tablet by mouth daily.     Yes Historical Provider, MD  oxybutynin (DITROPAN-XL) 5 MG 24 hr tablet Take 5 mg by mouth daily. 06/11/15  Yes Historical Provider, MD  traMADol (ULTRAM) 50 MG tablet Take 50 mg by mouth every 4 (four) hours as needed for moderate pain.  Yes Historical Provider, MD  benzonatate (TESSALON) 100 MG capsule Take 1 capsule (100 mg total) by mouth every 8 (eight) hours. Patient not taking: Reported on 06/29/2015 05/02/15   [name], MD  doxycycline (VIBRAMYCIN) 100 MG capsule Take 1 capsule (100 mg total) by mouth 2 (two) times daily. Patient not taking: Reported on 06/29/2015 05/02/15   [name], MD  furosemide (LASIX) 20 MG tablet Take 40-60 mg by mouth daily as needed for fluid. In addition to maintenance dose    Historical Provider, MD  nitroGLYCERIN (NITROSTAT) 0.4 MG SL tablet Place 0.4 mg  under the tongue every 5 (five) minutes x 3 doses as needed for chest pain (MAX 3 TABLETS).     Historical Provider, MD  Rivaroxaban (XARELTO) 15 MG TABS tablet TAKE 1 TABLET BY MOUTH DAILY WITH A MEAL. 06/12/15   Marinus Maw, MD    Social History:  reports that she has never smoked. She quit smokeless tobacco use about 25 years ago. Her smokeless tobacco use included Snuff. She reports that she does not drink alcohol or use illicit drugs.  Family History  Problem Relation Age of Onset  . Diabetes    . Hypertension    . Coronary artery disease      Filed Weights   06/29/15 1029  Weight: 96.616 kg (213 lb)    All the positives are listed in BOLD  Review of Systems:  HEENT: Headache, blurred vision, runny nose, sore throat Neck: Hypothyroidism, hyperthyroidism,,lymphadenopathy Chest : Shortness of breath, history of COPD, Asthma Heart : Chest pain, history of coronary arterey disease GI:  Nausea, vomiting, chronic diarrhea, constipation, GERD GU: Dysuria, urgency, frequency of urination, hematuria Neuro: Stroke, seizures, syncope Psych: Depression, anxiety, hallucinations   Physical Exam: Blood pressure 127/47, pulse 60, temperature 100.1 F (37.8 C), temperature source Oral, resp. rate 22, height  (1.575 m), weight 96.616 kg (213 lb), SpO2 96 %. Constitutional:   Patient is a well-developed and well-nourished female in no acute distress and cooperative with exam. Head: Normocephalic and atraumatic Mouth: Mucus membranes moist Eyes: PERRL, EOMI, conjunctivae normal Neck: Supple, No Thyromegaly Cardiovascular: RRR, S1 normal, S2 normal Pulmonary/Chest: Bibasilar crackles Abdominal: Soft. Non-tender, non-distended, bowel sounds are normal, no masses, organomegaly, or guarding present.  Neurological: A&O x3, Strength is normal and symmetric bilaterally, cranial nerve II-XII are grossly intact, no focal motor deficit, sensory intact to light touch bilaterally.    Extremities : No Cyanosis, Clubbing, trace edema noted in the bilateral lower extremities  Labs on Admission:  Basic Metabolic Panel:  Recent Labs Lab 06/29/15 1147 06/29/15 1155  NA 141 144  K 5.7* 3.2*  CL 107 107  CO2  --  28  GLUCOSE 122* 123*  BUN 23* 17  CREATININE 0.80 0.74  CALCIUM  --  8.8*   Liver Function Tests:  Recent Labs Lab 06/29/15 1155  AST 28  ALT 19  ALKPHOS 65  BILITOT 1.4*  PROT 6.0*  ALBUMIN 3.4*   CBC:  Recent Labs Lab 06/29/15 1147 06/29/15 1155  WBC  --  11.3*  NEUTROABS  --  10.5*  HGB 11.6* 12.4  HCT 34.0* 38.2  MCV  --  88.4  PLT  --  106*   Cardiac Enzymes:  Recent Labs Lab 06/29/15 1330  TROPONINI <0.03    BNP (last 3 results)  Recent Labs  06/29/15 1155  BNP 296.0*     Radiological Exams on Admission: Dg Chest Portable 1 View  06/29/2015  CLINICAL  DATA:  Arrived Via EMS. Body aches, discs urea and foul-smelling urine EXAM: PORTABLE CHEST 1 VIEW COMPARISON:  05/02/2015 FINDINGS: There is a left chest wall ICD with leads in the right atrial appendage and right ventricle. Moderate cardiac enlargement is noted. Aortic atherosclerosis noted There is mild interstitial edema. No significant pleural effusion or pneumothorax. No airspace consolidation. IMPRESSION: Cardiac enlargement and pulmonary edema. Electronically Signed   By: Signa Kell M.D.   On: 06/29/2015 10:49    EKG showed paced rhythm   Assessment/Plan Active Problems:   CHF (congestive heart failure) (HCC)   CHF exacerbation (HCC)   UTI   Hypokalemia  CHF exacerbation Patient will be admitted for CHF exacerbation, she has a history of grade 2 diastolic dysfunction. She has received 1 dose of Lasix 40 mg IV 1 in the ED. We'll start 20 mg IV Lasix every 12 hours from tomorrow morning. Foley catheter has been inserted. Follow strict intake and output.  UTI Patient has significant hematuria , though she has negative nitrite. Empirically she has  been started on ceftriaxone. Will continue ceftriaxone per pharmacy consultation, follow urine culture  History of proximal atrial fibrillation, status post pacemaker insertion Continue Coreg 12.5 mg, 1-1/2 tablets twice a day Continue eliquis for anticoagulation  Hypertension Continue Coreg as above, will hold amlodipine  Continue Cozaar  Hypokalemia Replace potassium and check BMP in a.m.  DVT prophylaxis Patient is on eliquis  Code status: Full code  Family discussion: Admission, patients condition and plan of care including tests being ordered have been discussed with the patient and her god daughter at bedside who indicate understanding and agree with the plan and Code Status.   Time Spent on Admission: 60 minutes  LAMA,GAGAN S [facility] Pager: [phone] 06/29/2015, 3:45 PM  If 7PM-7AM, please contact night-coverage  [email or web address]  Password [email or web address]

## 2015-06-29 NOTE — Progress Notes (Signed)
Pt's B/P 122/33 and pulse in 50's. MD informed and received verbal order to give Coreg as ordered.

## 2015-06-29 NOTE — ED Notes (Signed)
Per EDP pt is allowed to have sips of water, given at this time.

## 2015-06-29 NOTE — Progress Notes (Signed)
ANTIBIOTIC CONSULT NOTE - INITIAL  Pharmacy Consult for Rocephin Indication: UTI  No Known Allergies  Patient Measurements: Height: 5\' 2"  (157.5 cm) Weight: 213 lb (96.616 kg) IBW/kg (Calculated) : 50.1 Adjusted Body Weight:   Vital Signs: Temp: 100.1 F (37.8 C) (03/05 1029) Temp Source: Oral (03/05 1029) BP: 121/46 mmHg (03/05 1600) Pulse Rate: 58 (03/05 1600) Intake/Output from previous day:   Intake/Output from this shift: Total I/O In: 250 [IV Piggyback:250] Out: -   Labs:  Recent Labs  06/29/15 1147 06/29/15 1155  WBC  --  11.3*  HGB 11.6* 12.4  PLT  --  106*  CREATININE 0.80 0.74   Estimated Creatinine Clearance: 51.7 mL/min (by C-G formula based on Cr of 0.74). No results for input(s): VANCOTROUGH, VANCOPEAK, VANCORANDOM, GENTTROUGH, GENTPEAK, GENTRANDOM, TOBRATROUGH, TOBRAPEAK, TOBRARND, AMIKACINPEAK, AMIKACINTROU, AMIKACIN in the last 72 hours.   Microbiology: No results found for this or any previous visit (from the past 720 hour(s)).  Medical History: Past Medical History  Diagnosis Date  . Chronic diastolic CHF (congestive heart failure) (HCC)     LVEF 60-65%, grade 2 diastolic dysfunction, PASP 54mmHg  . Type 2 diabetes mellitus (HCC)   . Essential hypertension, benign   . Osteoarthritis   . Obesity   . CKD (chronic kidney disease) stage 2, GFR 60-89 ml/min   . Coronary atherosclerosis of native coronary artery February 05, 2009    NSTEMI s/p DES prox LAD  . Symptomatic bradycardia     12/2011 s/p MDT Sensia DC PPM, [id number] H  . Syncope     Medications:  Prescriptions prior to admission  Medication Sig Dispense Refill Last Dose  . amLODipine (NORVASC) 5 MG tablet TAKE 1 TABLET BY MOUTH EVERY DAY 30 tablet 6 06/29/2015 at Unknown time  . atorvastatin (LIPITOR) 40 MG tablet Take 1 tablet (40 mg total) by mouth at bedtime. 30 tablet 3 06/28/2015 at Unknown time  . carvedilol (COREG) 12.5 MG tablet TAKE 1 AND 1/2 OF A TABLET BY MOUTH TWICE DAILY  WITH A MEAL 90 tablet 6 06/29/2015 at 800  . ELIQUIS 2.5 MG TABS tablet Take 2.5 mg by mouth 2 (two) times daily.   06/29/2015 at 800  . isosorbide mononitrate (IMDUR) 30 MG 24 hr tablet Take 1 tablet (30 mg total) by mouth daily. In the evening 90 tablet 3 06/28/2015 at Unknown time  . losartan (COZAAR) 50 MG tablet Take 50 mg by mouth every morning.    06/29/2015 at Unknown time  . metFORMIN (GLUCOPHAGE) 500 MG tablet Take 500 mg by mouth 2 (two) times daily with a meal.   06/29/2015 at Unknown time  . Multiple Vitamin (MULTIVITAMIN) tablet Take 1 tablet by mouth daily.     06/29/2015 at Unknown time  . oxybutynin (DITROPAN-XL) 5 MG 24 hr tablet Take 5 mg by mouth daily.   06/29/2015 at Unknown time  . traMADol (ULTRAM) 50 MG tablet Take 50 mg by mouth every 4 (four) hours as needed for moderate pain.    06/28/2015 at Unknown time  . benzonatate (TESSALON) 100 MG capsule Take 1 capsule (100 mg total) by mouth every 8 (eight) hours. (Patient not taking: Reported on 06/29/2015) 21 capsule 0   . doxycycline (VIBRAMYCIN) 100 MG capsule Take 1 capsule (100 mg total) by mouth 2 (two) times daily. (Patient not taking: Reported on 06/29/2015) 14 capsule 0   . furosemide (LASIX) 20 MG tablet Take 40-60 mg by mouth daily as needed for fluid. In addition to maintenance dose  unknown  . nitroGLYCERIN (NITROSTAT) 0.4 MG SL tablet Place 0.4 mg under the tongue every 5 (five) minutes x 3 doses as needed for chest pain (MAX 3 TABLETS).    unknown  . Rivaroxaban (XARELTO) 15 MG TABS tablet TAKE 1 TABLET BY MOUTH DAILY WITH A MEAL. 30 tablet 10    Assessment: 82 yo female admitted from ED. Ceftriaxone 1 GM IV given in ED for UTI  Goal of Therapy:  Eradicate infection  Plan:  Ceftriaxone 1 GM IV every 24 hours F/U cultures Labs per protocol  Raquel James, Deeanna Beightol Bennett 06/29/2015,4:17 PM

## 2015-06-30 ENCOUNTER — Other Ambulatory Visit (HOSPITAL_COMMUNITY): Payer: Medicare Other

## 2015-06-30 ENCOUNTER — Observation Stay (HOSPITAL_COMMUNITY): Payer: Medicare Other

## 2015-06-30 DIAGNOSIS — Z8249 Family history of ischemic heart disease and other diseases of the circulatory system: Secondary | ICD-10-CM | POA: Diagnosis not present

## 2015-06-30 DIAGNOSIS — Z7984 Long term (current) use of oral hypoglycemic drugs: Secondary | ICD-10-CM | POA: Diagnosis not present

## 2015-06-30 DIAGNOSIS — E669 Obesity, unspecified: Secondary | ICD-10-CM | POA: Diagnosis present

## 2015-06-30 DIAGNOSIS — I252 Old myocardial infarction: Secondary | ICD-10-CM | POA: Diagnosis not present

## 2015-06-30 DIAGNOSIS — I4891 Unspecified atrial fibrillation: Secondary | ICD-10-CM | POA: Diagnosis present

## 2015-06-30 DIAGNOSIS — Z79899 Other long term (current) drug therapy: Secondary | ICD-10-CM | POA: Diagnosis not present

## 2015-06-30 DIAGNOSIS — E876 Hypokalemia: Secondary | ICD-10-CM

## 2015-06-30 DIAGNOSIS — N136 Pyonephrosis: Secondary | ICD-10-CM | POA: Diagnosis present

## 2015-06-30 DIAGNOSIS — N201 Calculus of ureter: Secondary | ICD-10-CM

## 2015-06-30 DIAGNOSIS — E782 Mixed hyperlipidemia: Secondary | ICD-10-CM | POA: Diagnosis not present

## 2015-06-30 DIAGNOSIS — I2510 Atherosclerotic heart disease of native coronary artery without angina pectoris: Secondary | ICD-10-CM | POA: Diagnosis present

## 2015-06-30 DIAGNOSIS — J9601 Acute respiratory failure with hypoxia: Secondary | ICD-10-CM | POA: Diagnosis present

## 2015-06-30 DIAGNOSIS — N182 Chronic kidney disease, stage 2 (mild): Secondary | ICD-10-CM | POA: Diagnosis not present

## 2015-06-30 DIAGNOSIS — E119 Type 2 diabetes mellitus without complications: Secondary | ICD-10-CM | POA: Diagnosis not present

## 2015-06-30 DIAGNOSIS — Z90710 Acquired absence of both cervix and uterus: Secondary | ICD-10-CM | POA: Diagnosis not present

## 2015-06-30 DIAGNOSIS — Z7901 Long term (current) use of anticoagulants: Secondary | ICD-10-CM | POA: Diagnosis not present

## 2015-06-30 DIAGNOSIS — Z87891 Personal history of nicotine dependence: Secondary | ICD-10-CM | POA: Diagnosis not present

## 2015-06-30 DIAGNOSIS — E1122 Type 2 diabetes mellitus with diabetic chronic kidney disease: Secondary | ICD-10-CM | POA: Diagnosis present

## 2015-06-30 DIAGNOSIS — I130 Hypertensive heart and chronic kidney disease with heart failure and stage 1 through stage 4 chronic kidney disease, or unspecified chronic kidney disease: Secondary | ICD-10-CM | POA: Diagnosis present

## 2015-06-30 DIAGNOSIS — I480 Paroxysmal atrial fibrillation: Secondary | ICD-10-CM | POA: Diagnosis not present

## 2015-06-30 DIAGNOSIS — Z833 Family history of diabetes mellitus: Secondary | ICD-10-CM | POA: Diagnosis not present

## 2015-06-30 DIAGNOSIS — R52 Pain, unspecified: Secondary | ICD-10-CM | POA: Diagnosis present

## 2015-06-30 DIAGNOSIS — R0600 Dyspnea, unspecified: Secondary | ICD-10-CM | POA: Diagnosis not present

## 2015-06-30 DIAGNOSIS — R32 Unspecified urinary incontinence: Secondary | ICD-10-CM | POA: Diagnosis present

## 2015-06-30 DIAGNOSIS — R1031 Right lower quadrant pain: Secondary | ICD-10-CM | POA: Diagnosis not present

## 2015-06-30 DIAGNOSIS — Z950 Presence of cardiac pacemaker: Secondary | ICD-10-CM | POA: Diagnosis not present

## 2015-06-30 DIAGNOSIS — I5033 Acute on chronic diastolic (congestive) heart failure: Secondary | ICD-10-CM | POA: Diagnosis present

## 2015-06-30 DIAGNOSIS — B964 Proteus (mirabilis) (morganii) as the cause of diseases classified elsewhere: Secondary | ICD-10-CM | POA: Diagnosis present

## 2015-06-30 DIAGNOSIS — R319 Hematuria, unspecified: Secondary | ICD-10-CM | POA: Diagnosis not present

## 2015-06-30 LAB — COMPREHENSIVE METABOLIC PANEL
ALT: 16 U/L (ref 14–54)
ANION GAP: 6 (ref 5–15)
AST: 19 U/L (ref 15–41)
Albumin: 2.8 g/dL — ABNORMAL LOW (ref 3.5–5.0)
Alkaline Phosphatase: 52 U/L (ref 38–126)
BUN: 22 mg/dL — ABNORMAL HIGH (ref 6–20)
CHLORIDE: 107 mmol/L (ref 101–111)
CO2: 30 mmol/L (ref 22–32)
Calcium: 8.4 mg/dL — ABNORMAL LOW (ref 8.9–10.3)
Creatinine, Ser: 0.95 mg/dL (ref 0.44–1.00)
GFR, EST AFRICAN AMERICAN: 60 mL/min — AB (ref >60)
GFR, EST NON AFRICAN AMERICAN: 52 mL/min — AB (ref >60)
Glucose, Bld: 169 mg/dL — ABNORMAL HIGH (ref 65–99)
POTASSIUM: 3.4 mmol/L — AB (ref 3.5–5.1)
Sodium: 143 mmol/L (ref 135–145)
Total Bilirubin: 1 mg/dL (ref 0.3–1.2)
Total Protein: 5.2 g/dL — ABNORMAL LOW (ref 6.5–8.1)

## 2015-06-30 LAB — CBC
HCT: 33.4 % — ABNORMAL LOW (ref 36.0–46.0)
Hemoglobin: 11.1 g/dL — ABNORMAL LOW (ref 12.0–15.0)
MCH: 29.8 pg (ref 26.0–34.0)
MCHC: 33.2 g/dL (ref 30.0–36.0)
MCV: 89.5 fL (ref 78.0–100.0)
PLATELETS: 116 10*3/uL — AB (ref 150–400)
RBC: 3.73 MIL/uL — ABNORMAL LOW (ref 3.87–5.11)
RDW: 13.3 % (ref 11.5–15.5)
WBC: 10.4 10*3/uL (ref 4.0–10.5)

## 2015-06-30 LAB — GLUCOSE, CAPILLARY
GLUCOSE-CAPILLARY: 144 mg/dL — AB (ref 65–99)
GLUCOSE-CAPILLARY: 152 mg/dL — AB (ref 65–99)
GLUCOSE-CAPILLARY: 186 mg/dL — AB (ref 65–99)
Glucose-Capillary: 174 mg/dL — ABNORMAL HIGH (ref 65–99)

## 2015-06-30 LAB — TROPONIN I

## 2015-06-30 MED ORDER — HYDROMORPHONE HCL 1 MG/ML IJ SOLN
1.0000 mg | INTRAMUSCULAR | Status: DC | PRN
  Administered 2015-06-30 – 2015-07-02 (×3): 1 mg via INTRAVENOUS
  Filled 2015-06-30 (×3): qty 1

## 2015-06-30 MED ORDER — TAMSULOSIN HCL 0.4 MG PO CAPS
0.4000 mg | ORAL_CAPSULE | Freq: Every day | ORAL | Status: DC
  Administered 2015-06-30 – 2015-07-04 (×5): 0.4 mg via ORAL
  Filled 2015-06-30 (×5): qty 1

## 2015-06-30 MED ORDER — POTASSIUM CHLORIDE CRYS ER 20 MEQ PO TBCR: 40.0000 meq | EXTENDED_RELEASE_TABLET | Freq: Once | ORAL | Status: DC

## 2015-06-30 MED ORDER — TRAMADOL HCL 50 MG PO TABS
50.0000 mg | ORAL_TABLET | Freq: Four times a day (QID) | ORAL | Status: DC | PRN
  Administered 2015-06-30 – 2015-07-04 (×5): 50 mg via ORAL
  Filled 2015-06-30 (×6): qty 1

## 2015-06-30 MED ORDER — FUROSEMIDE 10 MG/ML IJ SOLN
40.0000 mg | Freq: Two times a day (BID) | INTRAMUSCULAR | Status: DC
  Administered 2015-06-30 – 2015-07-04 (×9): 40 mg via INTRAVENOUS
  Filled 2015-06-30 (×9): qty 4

## 2015-06-30 MED ORDER — POTASSIUM CHLORIDE CRYS ER 20 MEQ PO TBCR
40.0000 meq | EXTENDED_RELEASE_TABLET | Freq: Every day | ORAL | Status: DC
  Administered 2015-06-30 – 2015-07-04 (×5): 40 meq via ORAL
  Filled 2015-06-30 (×6): qty 2

## 2015-06-30 MED ORDER — CEFTRIAXONE SODIUM 2 G IJ SOLR
2.0000 g | INTRAMUSCULAR | Status: DC
  Administered 2015-07-01 – 2015-07-03 (×3): 2 g via INTRAVENOUS
  Filled 2015-06-30 (×4): qty 2

## 2015-06-30 MED ORDER — METHOCARBAMOL 500 MG PO TABS
500.0000 mg | ORAL_TABLET | Freq: Three times a day (TID) | ORAL | Status: DC
  Administered 2015-06-30 – 2015-07-04 (×13): 500 mg via ORAL
  Filled 2015-06-30 (×13): qty 1

## 2015-06-30 NOTE — Progress Notes (Addendum)
[facility]                                                                              Patient Demographics  Brianna Chandler, is a 82 y.o. female, DOB - [birth year], ZOX:[id number]  Admit date - 06/29/2015   Admitting Physician Meredeth Ide, MD  Outpatient Primary MD for the patient is Louie Boston, MD  LOS -    Chief Complaint  Patient presents with  . Generalized Body Aches       Brief HPI  Patient is a 82 year old female with chronic diastolic CHF, diabetes mellitus, hypertension, CK D stage II, CAD presented with generalized body aches, 2 episodes of vomiting, doc colored urine, mild fever. In ED UA revealed hematuria and low-grade temperature 100.1. In ED, patient was hypoxic with O2 sats 87% on room air on admission. Chest x-ray showed pulmonary edema. BNP 296. Patient was admitted for further workup.    Assessment & Plan    Acute hypoxic respiratory failure secondary to Acute on chronic diastolic CHF exacerbation: Elevated BNP with pulmonary edema on chest x-ray. Peripheral edema -  prior 2-D echo in 9/13 showed EF of 60-65% with grade 2 diastolic dysfunction - Increase Lasix 40 mg IV q12hrs - Follow strict I's and O's and daily weights  - Influenza panel ruled out, chest x-ray consistent with cardiomegaly and pulmonary edema, no pneumonia  - Serial troponins negative so far, follow 2-D echo  Bilateral leg pain, worse on right - Complaining of pain in the legs, obtain Doppler lower extremity to rule out DVT. Placed on pain control, Robaxin  Hematuria: Unclear etiology, with complaints of abdominal pain prior to admission - Obtain CT renal stone study to rule out nephrolithiasis - Empirically she has been started on ceftriaxone. - follow urine culture Addendum: 4:40pm - urine cx GNR - CT renal stone study : Minimal right hydroureteronephrosis with perinephric stranding secondary to 6 mm linear calculus at right ureterovesical junction - called  Urology, d/w Dr Mena Goes, recommended flomax, NPO after MN, may need stent in AM, will follow-up with KUB & renal US. Dr Mena Goes will eval patient for further rec's.   History of proximal atrial fibrillation, status post pacemaker  - Continue Coreg  - CHADS vasc 4, Continue eliquis for anticoagulation  Hypertension Continue Coreg as above, hold amlodipine  Continue Cozaar, Lasix   Hypokalemia Replace potassium   DVT prophylaxis Patient is on eliquis  Code Status: full code   Family Communication: Discussed in detail with the patient, all imaging results, lab results explained to the patient and daughter at the bedside    Disposition Plan:   Time Spent in minutes 25   minutes  Procedures  None   Consults   None   DVT Prophylaxis eliquis   Medications  Scheduled Meds: . apixaban  2.5 mg Oral BID  . atorvastatin  40 mg Oral QHS  . carvedilol  18.75 mg Oral BID WC  . cefTRIAXone (ROCEPHIN)  IV  1 g Intravenous Q24H  . furosemide  40 mg Intravenous Q12H  . insulin aspart  0-9 Units Subcutaneous TID WC  . isosorbide mononitrate  30 mg Oral QHS  . losartan  50 mg Oral q morning - 10a  . methocarbamol  500 mg Oral TID  . oxybutynin  5 mg Oral Daily  . potassium chloride  40 mEq Oral Daily  . sodium chloride flush  3 mL Intravenous Q12H   Continuous Infusions: . sodium chloride 10 mL/hr at 06/29/15 1812   PRN Meds:.sodium chloride, HYDROmorphone (DILAUDID) injection, ondansetron **OR** ondansetron (ZOFRAN) IV, sodium chloride flush, traMADol   Antibiotics   Anti-infectives    Start     Dose/Rate Route Frequency Ordered Stop   06/30/15 1400  cefTRIAXone (ROCEPHIN) 1 g in dextrose 5 % 50 mL IVPB     1 g 100 mL/hr over 30 Minutes Intravenous Every 24 hours 06/29/15 1616     06/29/15 1345  cefTRIAXone (ROCEPHIN) 1 g in dextrose 5 % 50 mL IVPB     1 g Intravenous  Once 06/29/15 1333 06/29/15 1502        Subjective:   Brianna Chandler was seen and examined today.   patient had a rough night, complaining of pain in the legs, abdomen, headache. Daughter at the bedside.  Patient denies dizziness, N/V/D/C, new weakness, numbess, tingling. No fever or chills   Objective:   Filed Vitals:   06/29/15 1622 06/29/15 2150 06/30/15 0627 06/30/15 0837  BP: 122/33 140/49 110/40 126/51  Pulse: 59 65 60 120  Temp: 98.8 F (37.1 C)  99.4 F (37.4 C)   TempSrc: Oral  Oral   Resp: 18  17 18   Height: 5\' 2"  (1.575 m)     Weight: 94.9 kg (209 lb 3.5 oz)     SpO2: 100% 95% 96%     Intake/Output Summary (Last 24 hours) at 06/30/15 0854 Last data filed at 06/30/15 0839  Gross per 24 hour  Intake    253 ml  Output    750 ml  Net   -497 ml     Wt Readings from Last 3 Encounters:  06/29/15 94.9 kg (209 lb 3.5 oz)  05/02/15 96.616 kg (213 lb)  04/25/15 93.169 kg (205 lb 6.4 oz)     Exam  General: Alert and oriented x 3, NAD  HEENT:  PERRLA, EOMI  Neck: Supple, + JVD  CVS: S1 S2 auscultated, ireg ireg  Respiratory: basilar crackles   Abdomen: Soft, nontender, nondistended, + bowel sounds  Ext: no cyanosis clubbing, 1-2+ edema, RLE warm  Neuro: AAOx3, Cr N's II- XII. Strength 5/5 upper and lower extremities bilaterally  Skin: No rashes  Psych: Normal affect and demeanor, alert and oriented x3    Data Review   Micro Results No results found for this or any previous visit (from the past 240 hour(s)).  Radiology Reports Dg Chest Portable 1 View  06/29/2015  CLINICAL DATA:  Arrived Via EMS. Body aches, discs urea and foul-smelling urine EXAM: PORTABLE CHEST 1 VIEW COMPARISON:  05/02/2015 FINDINGS: There is a left chest wall ICD with leads in the right atrial appendage and right ventricle. Moderate cardiac enlargement is noted. Aortic atherosclerosis noted There is mild interstitial edema. No significant pleural effusion or pneumothorax. No airspace consolidation. IMPRESSION: Cardiac enlargement and pulmonary edema. Electronically Signed   By:  Signa Kellaylor  Stroud M.D.   On: 06/29/2015 10:49    CBC  Recent Labs Lab 06/29/15 1147 06/29/15 1155 06/30/15 0557  WBC  --  11.3* 10.4  HGB 11.6* 12.4 11.1*  HCT 34.0* 38.2 33.4*  PLT  --  106* 116*  MCV  --  88.4 89.5  MCH  --  28.7 29.8  MCHC  --  32.5 33.2  RDW  --  12.8 13.3  LYMPHSABS  --  0.4*  --   MONOABS  --  0.4  --   EOSABS  --  0.0  --   BASOSABS  --  0.0  --     Chemistries   Recent Labs Lab 06/29/15 1147 06/29/15 1155 06/30/15 0557  NA 141 144 143  K 5.7* 3.2* 3.4*  CL 107 107 107  CO2  --  28 30  GLUCOSE 122* 123* 169*  BUN 23* 17 22*  CREATININE 0.80 0.74 0.95  CALCIUM  --  8.8* 8.4*  AST  --  28 19  ALT  --  19 16  ALKPHOS  --  65 52  BILITOT  --  1.4* 1.0   ------------------------------------------------------------------------------------------------------------------ estimated creatinine clearance is 43.1 mL/min (by C-G formula based on Cr of 0.95). ------------------------------------------------------------------------------------------------------------------ No results for input(s): HGBA1C in the last 72 hours. ------------------------------------------------------------------------------------------------------------------ No results for input(s): CHOL, HDL, LDLCALC, TRIG, CHOLHDL, LDLDIRECT in the last 72 hours. ------------------------------------------------------------------------------------------------------------------ No results for input(s): TSH, T4TOTAL, T3FREE, THYROIDAB in the last 72 hours.  Invalid input(s): FREET3 ------------------------------------------------------------------------------------------------------------------ No results for input(s): VITAMINB12, FOLATE, FERRITIN, TIBC, IRON, RETICCTPCT in the last 72 hours.  Coagulation profile No results for input(s): INR, PROTIME in the last 168 hours.  No results for input(s): DDIMER in the last 72 hours.  Cardiac Enzymes  Recent Labs Lab 06/29/15 1330  06/29/15 2333  TROPONINI <0.03 <0.03   ------------------------------------------------------------------------------------------------------------------ Invalid input(s): POCBNP   Recent Labs  06/29/15 1742 06/29/15 2044 06/30/15 0726  GLUCAP 121* 241* 144*     RAI,RIPUDEEP M.D. [facility] 06/30/2015, 8:54 AM  Pager: [phone] Between 7am to 7pm - call Pager - [phone]  After 7pm go to [email or web address] - password [email or web address]  Call night coverage person covering after 7pm

## 2015-06-30 NOTE — Consult Note (Addendum)
Consult: right ureteral stone Requested by: Dr. Isidoro Donning  Chief Complaint: right ureteral stone   History of Present Illness: Admitted yesterday with generalzied body aches and low grade temp 100. UA showed no bacteria, but tntc rbc's. Urine Cx growing > 100 K GNR. Pt AFVSS today with normal wbc count. On Rocephin. CT today shows 5 mm right UVJ stone (almost in bladder) with mild prox hydro and PN stranding.There were no other stones.     Pt's granddaughter with her tonight. Reports pt had a lot of pain yesterday and two days ago, but feels much better this afternoon. Pt ate a "pretty good" supper. She has no history of stones. No voiding complaints.   Past Medical History  Diagnosis Date  . Chronic diastolic CHF (congestive heart failure) (HCC)     LVEF 60-65%, grade 2 diastolic dysfunction, PASP  . Type 2 diabetes mellitus (HCC)   . Essential hypertension, benign   . Osteoarthritis   . Obesity   . CKD (chronic kidney disease) stage 2, GFR 60-89 ml/min   . Coronary atherosclerosis of native coronary artery February 05, 2009    NSTEMI s/p DES prox LAD  . Symptomatic bradycardia     12/2011 s/p MDT Sensia DC PPM, [id number] H  . Syncope    Past Surgical History  Procedure Laterality Date  . Total abdominal hysterectomy    . Cataract extraction    . Pacemaker insertion    . Slt laser application Left 11/13/2012    Procedure: SLT LASER APPLICATION;  Surgeon: Susa Simmonds, MD;  Location: [facility];  Service: Ophthalmology;  Laterality: Left;  . Permanent pacemaker insertion Left 01/12/2012    Procedure: PERMANENT PACEMAKER INSERTION;  Surgeon: Marinus Maw, MD;  Location: [facility] CATH LAB;  Service: Cardiovascular;  Laterality: Left;    Home Medications:  Prescriptions prior to admission  Medication Sig Dispense Refill Last Dose  . amLODipine (NORVASC) 5 MG tablet TAKE 1 TABLET BY MOUTH EVERY DAY 30 tablet 6 06/29/2015 at Unknown time  . atorvastatin (LIPITOR) 40 MG tablet Take 1 tablet  (40 mg total) by mouth at bedtime. 30 tablet 3 06/28/2015 at Unknown time  . carvedilol (COREG) 12.5 MG tablet TAKE 1 AND 1/2 OF A TABLET BY MOUTH TWICE DAILY WITH A MEAL 90 tablet 6 06/29/2015 at 800  . ELIQUIS 2.5 MG TABS tablet Take 2.5 mg by mouth 2 (two) times daily.   06/29/2015 at 800  . isosorbide mononitrate (IMDUR) 30 MG 24 hr tablet Take 1 tablet (30 mg total) by mouth daily. In the evening 90 tablet 3 06/28/2015 at Unknown time  . losartan (COZAAR) 50 MG tablet Take 50 mg by mouth every morning.    06/29/2015 at Unknown time  . metFORMIN (GLUCOPHAGE) 500 MG tablet Take 500 mg by mouth 2 (two) times daily with a meal.   06/29/2015 at Unknown time  . Multiple Vitamin (MULTIVITAMIN) tablet Take 1 tablet by mouth daily.     06/29/2015 at Unknown time  . oxybutynin (DITROPAN-XL) 5 MG 24 hr tablet Take 5 mg by mouth daily.   06/29/2015 at Unknown time  . traMADol (ULTRAM) 50 MG tablet Take 50 mg by mouth every 4 (four) hours as needed for moderate pain.    06/28/2015 at Unknown time  . benzonatate (TESSALON) 100 MG capsule Take 1 capsule (100 mg total) by mouth every 8 (eight) hours. (Patient not taking: Reported on 06/29/2015) 21 capsule 0   . doxycycline (VIBRAMYCIN) 100 MG capsule Take  1 capsule (100 mg total) by mouth 2 (two) times daily. (Patient not taking: Reported on 06/29/2015) 14 capsule 0   . furosemide (LASIX) 20 MG tablet Take 40-60 mg by mouth daily as needed for fluid. In addition to maintenance dose   unknown  . nitroGLYCERIN (NITROSTAT) 0.4 MG SL tablet Place 0.4 mg under the tongue every 5 (five) minutes x 3 doses as needed for chest pain (MAX 3 TABLETS).    unknown  . Rivaroxaban (XARELTO) 15 MG TABS tablet TAKE 1 TABLET BY MOUTH DAILY WITH A MEAL. 30 tablet 10    Allergies: No Known Allergies  Family History  Problem Relation Age of Onset  . Diabetes    . Hypertension    . Coronary artery disease     Social History:  reports that she has never smoked. She quit smokeless tobacco use about  25 years ago. Her smokeless tobacco use included Snuff. She reports that she does not drink alcohol or use illicit drugs.  ROS: A complete review of systems was performed.  All systems are negative except for pertinent findings as noted. Review of Systems  All other systems reviewed and are negative.    Physical Exam:  Vital signs in last 24 hours: Temp:  [97.4 F (36.3 C)-99.4 F (37.4 C)] 97.4 F (36.3 C) (03/06 1403) Pulse Rate:  [60-120] 60 (03/06 1403) Resp:  [17-18] 17 (03/06 1403) BP: (110-176)/(40-64) 176/64 mmHg (03/06 1403) SpO2:  [95 %-99 %] 99 % (03/06 1403) General:  Alert and oriented, No acute distress Cardiovascular: Regular rate and rhythm - pacer - HR 61  Lungs: Regular rate and effort Abdomen: Soft, nontender, nondistended, no abdominal masses Back: No CVA tenderness Extremities: No edema Neurologic: Grossly intact  Laboratory Data:  Results for orders placed or performed during the hospital encounter of 06/29/15 (from the past 24 hour(s))  Glucose, capillary     Status: Abnormal   Collection Time: 06/29/15  8:44 PM  Result Value Ref Range   Glucose-Capillary 241 (H) 65 - 99 mg/dL   Comment 1 Notify RN    Comment 2 Document in Chart   Troponin I     Status: None   Collection Time: 06/29/15 11:33 PM  Result Value Ref Range   Troponin I <0.03 <0.031 ng/mL  CBC     Status: Abnormal   Collection Time: 06/30/15  5:57 AM  Result Value Ref Range   WBC 10.4 4.0 - 10.5 K/uL   RBC 3.73 (L) 3.87 - 5.11 MIL/uL   Hemoglobin 11.1 (L) 12.0 - 15.0 g/dL   HCT 16.1 (L) 09.6 - 04.5 %   MCV 89.5 78.0 - 100.0 fL   MCH 29.8 26.0 - 34.0 pg   MCHC 33.2 30.0 - 36.0 g/dL   RDW 40.9 81.1 - 91.4 %   Platelets 116 (L) 150 - 400 K/uL  Comprehensive metabolic panel     Status: Abnormal   Collection Time: 06/30/15  5:57 AM  Result Value Ref Range   Sodium 143 135 - 145 mmol/L   Potassium 3.4 (L) 3.5 - 5.1 mmol/L   Chloride 107 101 - 111 mmol/L   CO2 30 22 - 32 mmol/L    Glucose, Bld 169 (H) 65 - 99 mg/dL   BUN 22 (H) 6 - 20 mg/dL   Creatinine, Ser 7.82 0.44 - 1.00 mg/dL   Calcium 8.4 (L) 8.9 - 10.3 mg/dL   Total Protein 5.2 (L) 6.5 - 8.1 g/dL   Albumin 2.8 (L) 3.5 -  5.0 g/dL   AST 19 15 - 41 U/L   ALT 16 14 - 54 U/L   Alkaline Phosphatase 52 38 - 126 U/L   Total Bilirubin 1.0 0.3 - 1.2 mg/dL   GFR calc non Af Amer 52 (L) >60 mL/min   GFR calc Af Amer 60 (L) >60 mL/min   Anion gap 6 5 - 15  Glucose, capillary     Status: Abnormal   Collection Time: 06/30/15  7:26 AM  Result Value Ref Range   Glucose-Capillary 144 (H) 65 - 99 mg/dL  Glucose, capillary     Status: Abnormal   Collection Time: 06/30/15 12:19 PM  Result Value Ref Range   Glucose-Capillary 152 (H) 65 - 99 mg/dL  Glucose, capillary     Status: Abnormal   Collection Time: 06/30/15  4:32 PM  Result Value Ref Range   Glucose-Capillary 186 (H) 65 - 99 mg/dL   Comment 1 Notify RN    Comment 2 Document in Chart    Recent Results (from the past 240 hour(s))  Urine culture     Status: None (Preliminary result)   Collection Time: 06/29/15 11:07 AM  Result Value Ref Range Status   Specimen Description URINE, CATHETERIZED  Final   Special Requests NONE  Final   Culture   Final    >=100,000 COLONIES/mL GRAM NEGATIVE RODS CULTURE REINCUBATED FOR BETTER GROWTH [facility]    Report Status PENDING  Incomplete   Creatinine:  Recent Labs  06/29/15 1147 06/29/15 1155 06/30/15 0557  CREATININE 0.80 0.74 0.95    Impression/Assessment:  UTI - on rocephin - urine Cx pending   Right ureteral stone - almost in bladder. Pt feeling better. No fever or WBC. Nl kidney fxn. Pt ate dinner tonight, no pain, looks well. Don't feel she needs urgent intervention. Start tamsulosin if pt can tolerate. Will check KUB in AM. Make NPO at [facility] in case of pain, nausea, fever - we can add on for stent tomorrow. Otherwise, pt will likely pass. Discussed with granddaughter and pt nature, r/b of  surveillance/medical tx vs. Cysto/stent placement. They favor non-op management.   Discussed with Dr. Isidoro Donningai. Dr. Retta Dionesahlstedt will check on pt in AM.   Delisia Mcquiston 06/30/2015, 7:05 PM  UA

## 2015-07-01 ENCOUNTER — Inpatient Hospital Stay (HOSPITAL_COMMUNITY): Payer: Medicare Other

## 2015-07-01 DIAGNOSIS — N201 Calculus of ureter: Secondary | ICD-10-CM

## 2015-07-01 DIAGNOSIS — R1031 Right lower quadrant pain: Secondary | ICD-10-CM

## 2015-07-01 DIAGNOSIS — R0600 Dyspnea, unspecified: Secondary | ICD-10-CM

## 2015-07-01 LAB — GLUCOSE, CAPILLARY
GLUCOSE-CAPILLARY: 153 mg/dL — AB (ref 65–99)
GLUCOSE-CAPILLARY: 113 mg/dL — AB (ref 65–99)
Glucose-Capillary: 185 mg/dL — ABNORMAL HIGH (ref 65–99)

## 2015-07-01 LAB — BASIC METABOLIC PANEL
ANION GAP: 6 (ref 5–15)
BUN: 27 mg/dL — ABNORMAL HIGH (ref 6–20)
CALCIUM: 8.3 mg/dL — AB (ref 8.9–10.3)
CO2: 29 mmol/L (ref 22–32)
Chloride: 106 mmol/L (ref 101–111)
Creatinine, Ser: 0.99 mg/dL (ref 0.44–1.00)
GFR calc Af Amer: 57 mL/min — ABNORMAL LOW (ref >60)
GFR, EST NON AFRICAN AMERICAN: 49 mL/min — AB (ref >60)
GLUCOSE: 173 mg/dL — AB (ref 65–99)
Potassium: 3.8 mmol/L (ref 3.5–5.1)
SODIUM: 141 mmol/L (ref 135–145)

## 2015-07-01 LAB — CBC
HCT: 32 % — ABNORMAL LOW (ref 36.0–46.0)
Hemoglobin: 10 g/dL — ABNORMAL LOW (ref 12.0–15.0)
MCH: 28.5 pg (ref 26.0–34.0)
MCHC: 31.3 g/dL (ref 30.0–36.0)
MCV: 91.2 fL (ref 78.0–100.0)
Platelets: 99 10*3/uL — ABNORMAL LOW (ref 150–400)
RBC: 3.51 MIL/uL — ABNORMAL LOW (ref 3.87–5.11)
RDW: 13.5 % (ref 11.5–15.5)
WBC: 8.2 10*3/uL (ref 4.0–10.5)

## 2015-07-01 LAB — HEMOGLOBIN A1C
HEMOGLOBIN A1C: 6.8 % — AB (ref 4.8–5.6)
MEAN PLASMA GLUCOSE: 148 mg/dL

## 2015-07-01 MED ORDER — CARVEDILOL 3.125 MG PO TABS
6.2500 mg | ORAL_TABLET | Freq: Two times a day (BID) | ORAL | Status: DC
  Administered 2015-07-01 – 2015-07-04 (×6): 6.25 mg via ORAL
  Filled 2015-07-01 (×6): qty 2

## 2015-07-01 NOTE — Progress Notes (Signed)
Subjective: Patient reports feeling better. No N/V, chills, pain minimal  Objective: Vital signs in last 24 hours: Temp:  [97.4 F (36.3 C)-99.1 F (37.3 C)] 98.9 F (37.2 C) (03/07 0705) Pulse Rate:  [60-120] 62 (03/07 0705) Resp:  [17-24] 22 (03/07 0705) BP: (96-176)/(34-64) 96/34 mmHg (03/07 0705) SpO2:  [95 %-99 %] 95 % (03/07 0705)  Intake/Output from previous day: 03/06 0701 - 03/07 0700 In: 243 [P.O.:240; I.V.:3] Out: 150 [Urine:150] Intake/Output this shift:    Physical Exam:  Constitutional: Vital signs reviewed. WD WN in NAD   Eyes: PERRL, No scleral icterus.   Cardiovascular: RRR Pulmonary/Chest: Normal effort   Lab Results:  Recent Labs  06/29/15 1155 06/30/15 0557 07/01/15 0611  HGB 12.4 11.1* 10.0*  HCT 38.2 33.4* 32.0*   BMET  Recent Labs  06/30/15 0557 07/01/15 0611  NA 143 141  K 3.4* 3.8  CL 107 106  CO2 30 29  GLUCOSE 169* 173*  BUN 22* 27*  CREATININE 0.95 0.99  CALCIUM 8.4* 8.3*   No results for input(s): LABPT, INR in the last 72 hours. No results for input(s): LABURIN in the last 72 hours. Results for orders placed or performed during the hospital encounter of 06/29/15  Urine culture     Status: None (Preliminary result)   Collection Time: 06/29/15 11:07 AM  Result Value Ref Range Status   Specimen Description URINE, CATHETERIZED  Final   Special Requests NONE  Final   Culture   Final    >=100,000 COLONIES/mL GRAM NEGATIVE RODS CULTURE REINCUBATED FOR BETTER GROWTH Performed [facility]    Report Status PENDING  Incomplete    Studies/Results: [place] Venous Img Lower Bilateral  06/30/2015  CLINICAL DATA:  Bilateral edema and leg pain EXAM: BILATERAL LOWER EXTREMITY VENOUS DOPPLER ULTRASOUND TECHNIQUE: Gray-scale sonography with graded compression, as well as color Doppler and duplex ultrasound were performed to evaluate the lower extremity deep venous systems from the level of the common femoral vein and including  the common femoral, femoral, profunda femoral, popliteal and calf veins including the posterior tibial, peroneal and gastrocnemius veins when visible. The superficial great saphenous vein was also interrogated. Spectral Doppler was utilized to evaluate flow at rest and with distal augmentation maneuvers in the common femoral, femoral and popliteal veins. COMPARISON:  None. FINDINGS: RIGHT LOWER EXTREMITY Common Femoral Vein: No evidence of thrombus. Normal compressibility, respiratory phasicity and response to augmentation. Saphenofemoral Junction: No evidence of thrombus. Normal compressibility and flow on color Doppler imaging. Profunda Femoral Vein: No evidence of thrombus. Normal compressibility and flow on color Doppler imaging. Femoral Vein: No evidence of thrombus. Normal compressibility, respiratory phasicity and response to augmentation. Popliteal Vein: No evidence of thrombus. Normal compressibility, respiratory phasicity and response to augmentation. Calf Veins: No evidence of thrombus. Normal compressibility and flow on color Doppler imaging. Superficial Great Saphenous Vein: No evidence of thrombus. Normal compressibility and flow on color Doppler imaging. Other Findings:  Right lower extremity superficial edema. LEFT LOWER EXTREMITY Common Femoral Vein: No evidence of thrombus. Normal compressibility, respiratory phasicity and response to augmentation. Saphenofemoral Junction: No evidence of thrombus. Normal compressibility and flow on color Doppler imaging. Profunda Femoral Vein: No evidence of thrombus. Normal compressibility and flow on color Doppler imaging. Femoral Vein: No evidence of thrombus. Normal compressibility, respiratory phasicity and response to augmentation. Popliteal Vein: No evidence of thrombus. Normal compressibility, respiratory phasicity and response to augmentation. Calf Veins: No evidence of thrombus. Normal compressibility and flow on color Doppler imaging.  Superficial Great  Saphenous Vein: No evidence of thrombus. Normal compressibility and flow on color Doppler imaging. Other Findings:  Left lower extremity superficial edema. IMPRESSION: Sonographic survey of the bilateral lower extremities negative for DVT. Bilateral lower leg edema. Signed, Yvone Neu. Loreta Ave, DO [facility] Electronically Signed   By: Gilmer Mor D.O.   On: 06/30/2015 11:24   Dg Chest Portable 1 View  06/29/2015  CLINICAL DATA:  Arrived Via EMS. Body aches, discs urea and foul-smelling urine EXAM: PORTABLE CHEST 1 VIEW COMPARISON:  05/02/2015 FINDINGS: There is a left chest wall ICD with leads in the right atrial appendage and right ventricle. Moderate cardiac enlargement is noted. Aortic atherosclerosis noted There is mild interstitial edema. No significant pleural effusion or pneumothorax. No airspace consolidation. IMPRESSION: Cardiac enlargement and pulmonary edema. Electronically Signed   By: Signa Kell M.D.   On: 06/29/2015 10:49   Ct Renal Stone Study  06/30/2015  CLINICAL DATA:  Dysuria, hematuria. EXAM: CT ABDOMEN AND PELVIS WITHOUT CONTRAST TECHNIQUE: Multidetector CT imaging of the abdomen and pelvis was performed following the standard protocol without IV contrast. COMPARISON:  None available currently. FINDINGS: Severe multilevel degenerative disc disease is noted in the lumbar spine. Minimal bilateral posterior basilar subsegmental atelectasis is noted. Small gallstone is noted. No focal abnormality is noted in the liver, spleen or pancreas on these unenhanced images. Adrenal glands are unremarkable. Left kidney and ureter are unremarkable. Minimal right hydroureteronephrosis is noted with perinephric stranding. This appears to be due to 6 mm linear calculus at the right ureterovesical junction. Atherosclerosis of abdominal aorta is noted without aneurysm formation. There is no evidence of bowel obstruction. No abnormal fluid collection  is noted. Diverticulosis of sigmoid colon is noted without inflammation. Urinary bladder is decompressed secondary to Foley catheter. IMPRESSION: Solitary gallstone. Minimal right hydroureteronephrosis with perinephric stranding secondary to 6 mm linear calculus at right ureterovesical junction. Diverticulosis of sigmoid colon is noted without inflammation. Atherosclerosis of abdominal aorta is noted without aneurysm formation. Electronically Signed   By: Lupita Raider, M.D.   On: 06/30/2015 11:54    Assessment/Plan:   Hospital day #3. Admitted for abdominal pain. Narrow right ureterovesical junction stone without significant hydronephrosis, but with a culture positive for gram negatives. She does not look septic/toxic. She does seem to be doing better. KUB from today is pending. I agree with continued medical management-tailor antibiotic to pending culture/sensitivity. I will check KUB that is pending. I would feel fine putting her on a diet, as I do not think that she needs stent placement at this time. We will continue to follow.   LOS: 1 day   Marcine Matar M 07/01/2015, 7:24 AM

## 2015-07-01 NOTE — Progress Notes (Signed)
[facility]                                                                              Patient Demographics  Brianna Chandler, is a 82 y.o. female, DOB - [birth year], HUD:[id number]  Admit date - 06/29/2015   Admitting Physician Meredeth Ide, MD  Outpatient Primary MD for the patient is TAPPER,DAVID B, MD  LOS - 1   Chief Complaint  Patient presents with  . Generalized Body Aches       Brief HPI  Patient is a 82 year old female with chronic diastolic CHF, diabetes mellitus, hypertension, CK D stage II, CAD presented with generalized body aches, 2 episodes of vomiting, doc colored urine, mild fever. In ED UA revealed hematuria and low-grade temperature 100.1. In ED, patient was hypoxic with O2 sats 87% on room air on admission. Chest x-ray showed pulmonary edema. BNP 296. Patient was admitted for further workup.    Assessment & Plan    Acute hypoxic respiratory failure secondary to Acute on chronic diastolic CHF exacerbation: Elevated BNP with pulmonary edema on chest x-ray. Peripheral edema - Improving, O2 sats 95% on 2 L - prior 2-D echo in 9/13 showed EF of 60-65% with grade 2 diastolic dysfunction - Continue Lasix 40 mg IV q12hrs, negative balance of 400 mL, weight still not recorded - Influenza panel ruled out, chest x-ray consistent with cardiomegaly and pulmonary edema, no pneumonia  - Serial troponins negative so far, 2-D echo still pending   Bilateral leg pain, worse on right: likely due to edema -  Doppler lower extremity negative for DVT. Placed on pain control, Robaxin  Hematuria: Unclear etiology, with complaints of abdominal pain prior to admission, nephrolithiasis at Rt UVJ, and gram-negative rod UTI - Urine culture she has been started on ceftriaxone. -  CT renal stone study : Minimal right hydroureteronephrosis with perinephric stranding secondary to 6 mm linear calculus at right ureterovesical junction - appreciate urology recommendations, placed  on Flomax, KUB today per Dr. Lenn Sink, likely will not need stent.   Gram-negative rods UTI/pyelonephritis - Continue IV Rocephin, follow urine culture and sensitivities  History of proximal atrial fibrillation, status post pacemaker  - Continue Coreg  - CHADS vasc 4, Continue eliquis for anticoagulation  Hypertension Continue Coreg, decrease the dose  - Hold Cozaar, amlodipine as patient is on IV Lasix to avoid hypotension   Hypokalemia Replaced potassium   DVT prophylaxis Patient is on eliquis  Code Status: full code   Family Communication: Discussed in detail with the patient, all imaging results, lab results explained to the patient and daughter at the bedside    Disposition Plan:   Time Spent in minutes 25   minutes  Procedures CT renal stone study   Consults  urology   DVT Prophylaxis eliquis   Medications  Scheduled Meds: . apixaban  2.5 mg Oral BID  . atorvastatin  40 mg Oral QHS  . carvedilol  6.25 mg Oral BID WC  . cefTRIAXone (ROCEPHIN)  IV  2 g Intravenous Q24H  . furosemide  40 mg Intravenous Q12H  . insulin aspart  0-9 Units Subcutaneous TID WC  .  methocarbamol  500 mg Oral TID  . oxybutynin  5 mg Oral Daily  . potassium chloride  40 mEq Oral Daily  . sodium chloride flush  3 mL Intravenous Q12H  . tamsulosin  0.4 mg Oral Daily   Continuous Infusions: . sodium chloride 10 mL/hr at 06/29/15 1812   PRN Meds:.sodium chloride, HYDROmorphone (DILAUDID) injection, ondansetron **OR** ondansetron (ZOFRAN) IV, sodium chloride flush, traMADol   Antibiotics   Anti-infectives    Start     Dose/Rate Route Frequency Ordered Stop   07/01/15 1400  cefTRIAXone (ROCEPHIN) 2 g in dextrose 5 % 50 mL IVPB     2 g 100 mL/hr over 30 Minutes Intravenous Every 24 hours 06/30/15 1638     06/30/15 1400  cefTRIAXone (ROCEPHIN) 1 g in dextrose 5 % 50 mL IVPB  Status:  Discontinued     1 g 100 mL/hr over 30 Minutes Intravenous Every 24 hours 06/29/15 1616 06/30/15  1638   06/29/15 1345  cefTRIAXone (ROCEPHIN) 1 g in dextrose 5 % 50 mL IVPB     1 g Intravenous  Once 06/29/15 1333 06/29/15 1502        Subjective:   Brianna Chandler was seen and examined today.Patient feels a whole lot better today, peripheral edema is also improving, shortness of breath is improving. No fevers or chills, no abdominal pain today.  Patient denies dizziness, N/V/D/C, new weakness, numbess, tingling. No acute events overnight.   Objective:   Filed Vitals:   06/30/15 2039 06/30/15 2113 07/01/15 0705 07/01/15 0915  BP:  136/49 96/34 117/42  Pulse:  63 62 71  Temp:  99.1 F (37.3 C) 98.9 F (37.2 C)   TempSrc:  Oral Oral   Resp:  24 22   Height:      Weight:      SpO2: 96% 99% 95%     Intake/Output Summary (Last 24 hours) at 07/01/15 1403 Last data filed at 07/01/15 1318  Gross per 24 hour  Intake    120 ml  Output    150 ml  Net    -30 ml     Wt Readings from Last 3 Encounters:  06/29/15 94.9 kg (209 lb 3.5 oz)  05/02/15 96.616 kg (213 lb)  04/25/15 93.169 kg (205 lb 6.4 oz)     Exam  General: Alert and oriented x 3, NAD  HEENT:  PERRLA, EOMI  Neck: Supple, + JVD  CVS: S1 S2 auscultated, ireg ireg  Respiratory: decreased breath sound at the bases  Abdomen: Soft, nontender, nondistended, + bowel sounds  Ext: no cyanosis clubbing, 1-2+ edema  Neuro:no new deficits   Skin: No rashes  Psych: Normal affect and demeanor, alert and oriented x3    Data Review   Micro Results Recent Results (from the past 240 hour(s))  Urine culture     Status: None (Preliminary result)   Collection Time: 06/29/15 11:07 AM  Result Value Ref Range Status   Specimen Description URINE, CATHETERIZED  Final   Special Requests NONE  Final   Culture   Final    >=100,000 COLONIES/mL GRAM NEGATIVE RODS Performed at [facility]    Report Status PENDING  Incomplete    Radiology Reports Dg Abd 1 View  07/01/2015  CLINICAL DATA:  Right ureteral stone.  EXAM: ABDOMEN - 1 VIEW COMPARISON:  CT June 30, 2015 FINDINGS: The bowel gas pattern is normal. Extensive bowel content is identified throughout colon. No radio-opaque calculi or other significant radiographic abnormality are  seen. The CT noted calcifications right ureterovesicular junction is not definitely seen on x-ray. Degenerative joint changes and scoliosis of the spine are identified. There are pelvic phleboliths. IMPRESSION: The CT noted calcification at the right ureterovesicular junction is not definitely seen on x-ray. Constipation. Electronically Signed   By: Sherian ReinWei-Chen  Lin M.D.   On: 07/01/2015 13:48   [place] Venous Img Lower Bilateral  06/30/2015  CLINICAL DATA:  Bilateral edema and leg pain EXAM: BILATERAL LOWER EXTREMITY VENOUS DOPPLER ULTRASOUND TECHNIQUE: Gray-scale sonography with graded compression, as well as color Doppler and duplex ultrasound were performed to evaluate the lower extremity deep venous systems from the level of the common femoral vein and including the common femoral, femoral, profunda femoral, popliteal and calf veins including the posterior tibial, peroneal and gastrocnemius veins when visible. The superficial great saphenous vein was also interrogated. Spectral Doppler was utilized to evaluate flow at rest and with distal augmentation maneuvers in the common femoral, femoral and popliteal veins. COMPARISON:  None. FINDINGS: RIGHT LOWER EXTREMITY Common Femoral Vein: No evidence of thrombus. Normal compressibility, respiratory phasicity and response to augmentation. Saphenofemoral Junction: No evidence of thrombus. Normal compressibility and flow on color Doppler imaging. Profunda Femoral Vein: No evidence of thrombus. Normal compressibility and flow on color Doppler imaging. Femoral Vein: No evidence of thrombus. Normal compressibility, respiratory phasicity and response to augmentation. Popliteal Vein: No evidence of thrombus. Normal compressibility, respiratory phasicity and  response to augmentation. Calf Veins: No evidence of thrombus. Normal compressibility and flow on color Doppler imaging. Superficial Great Saphenous Vein: No evidence of thrombus. Normal compressibility and flow on color Doppler imaging. Other Findings:  Right lower extremity superficial edema. LEFT LOWER EXTREMITY Common Femoral Vein: No evidence of thrombus. Normal compressibility, respiratory phasicity and response to augmentation. Saphenofemoral Junction: No evidence of thrombus. Normal compressibility and flow on color Doppler imaging. Profunda Femoral Vein: No evidence of thrombus. Normal compressibility and flow on color Doppler imaging. Femoral Vein: No evidence of thrombus. Normal compressibility, respiratory phasicity and response to augmentation. Popliteal Vein: No evidence of thrombus. Normal compressibility, respiratory phasicity and response to augmentation. Calf Veins: No evidence of thrombus. Normal compressibility and flow on color Doppler imaging. Superficial Great Saphenous Vein: No evidence of thrombus. Normal compressibility and flow on color Doppler imaging. Other Findings:  Left lower extremity superficial edema. IMPRESSION: Sonographic survey of the bilateral lower extremities negative for DVT. Bilateral lower leg edema. Signed, Yvone NeuJaime S. Loreta AveWagner, DO [facility] Electronically Signed   By: Gilmer MorJaime  Wagner D.O.   On: 06/30/2015 11:24   Dg Chest Portable 1 View  06/29/2015  CLINICAL DATA:  Arrived Via EMS. Body aches, discs urea and foul-smelling urine EXAM: PORTABLE CHEST 1 VIEW COMPARISON:  05/02/2015 FINDINGS: There is a left chest wall ICD with leads in the right atrial appendage and right ventricle. Moderate cardiac enlargement is noted. Aortic atherosclerosis noted There is mild interstitial edema. No significant pleural effusion or pneumothorax. No airspace consolidation. IMPRESSION: Cardiac enlargement and pulmonary edema.  Electronically Signed   By: Signa Kellaylor  Stroud M.D.   On: 06/29/2015 10:49   Ct Renal Stone Study  06/30/2015  CLINICAL DATA:  Dysuria, hematuria. EXAM: CT ABDOMEN AND PELVIS WITHOUT CONTRAST TECHNIQUE: Multidetector CT imaging of the abdomen and pelvis was performed following the standard protocol without IV contrast. COMPARISON:  None available currently. FINDINGS: Severe multilevel degenerative disc disease is noted in the lumbar spine. Minimal bilateral posterior basilar subsegmental atelectasis is noted. Small gallstone is noted.  No focal abnormality is noted in the liver, spleen or pancreas on these unenhanced images. Adrenal glands are unremarkable. Left kidney and ureter are unremarkable. Minimal right hydroureteronephrosis is noted with perinephric stranding. This appears to be due to 6 mm linear calculus at the right ureterovesical junction. Atherosclerosis of abdominal aorta is noted without aneurysm formation. There is no evidence of bowel obstruction. No abnormal fluid collection is noted. Diverticulosis of sigmoid colon is noted without inflammation. Urinary bladder is decompressed secondary to Foley catheter. IMPRESSION: Solitary gallstone. Minimal right hydroureteronephrosis with perinephric stranding secondary to 6 mm linear calculus at right ureterovesical junction. Diverticulosis of sigmoid colon is noted without inflammation. Atherosclerosis of abdominal aorta is noted without aneurysm formation. Electronically Signed   By: Lupita Raider, M.D.   On: 06/30/2015 11:54    CBC  Recent Labs Lab 06/29/15 1147 06/29/15 1155 06/30/15 0557 07/01/15 0611  WBC  --  11.3* 10.4 8.2  HGB 11.6* 12.4 11.1* 10.0*  HCT 34.0* 38.2 33.4* 32.0*  PLT  --  106* 116* 99*  MCV  --  88.4 89.5 91.2  MCH  --  28.7 29.8 28.5  MCHC  --  32.5 33.2 31.3  RDW  --  12.8 13.3 13.5  LYMPHSABS  --  0.4*  --   --   MONOABS  --  0.4  --   --   EOSABS  --  0.0  --   --   BASOSABS  --  0.0  --   --      Chemistries   Recent Labs Lab 06/29/15 1147 06/29/15 1155 06/30/15 0557 07/01/15 0611  NA 141 144 143 141  K 5.7* 3.2* 3.4* 3.8  CL 107 107 107 106  CO2  --  GLUCOSE 122* 123* 169* 173*  BUN 23* 17 22* 27*  CREATININE 0.80 0.74 0.95 0.99  CALCIUM  --  8.8* 8.4* 8.3*  AST  --  28 19  --   ALT  --  19 16  --   ALKPHOS  --  65 52  --   BILITOT  --  1.4* 1.0  --    ------------------------------------------------------------------------------------------------------------------ estimated creatinine clearance is 41.4 mL/min (by C-G formula based on Cr of 0.99). ------------------------------------------------------------------------------------------------------------------  Recent Labs  06/29/15 1155  HGBA1C 6.8*   ------------------------------------------------------------------------------------------------------------------ No results for input(s): CHOL, HDL, LDLCALC, TRIG, CHOLHDL, LDLDIRECT in the last 72 hours. ------------------------------------------------------------------------------------------------------------------ No results for input(s): TSH, T4TOTAL, T3FREE, THYROIDAB in the last 72 hours.  Invalid input(s): FREET3 ------------------------------------------------------------------------------------------------------------------ No results for input(s): VITAMINB12, FOLATE, FERRITIN, TIBC, IRON, RETICCTPCT in the last 72 hours.  Coagulation profile No results for input(s): INR, PROTIME in the last 168 hours.  No results for input(s): DDIMER in the last 72 hours.  Cardiac Enzymes  Recent Labs Lab 06/29/15 1330 06/29/15 2333  TROPONINI <0.03 <0.03   ------------------------------------------------------------------------------------------------------------------ Invalid input(s): POCBNP   Recent Labs  06/30/15 0726 06/30/15 1219 06/30/15 1632 06/30/15 2115 07/01/15 0801 07/01/15 1149  GLUCAP 144* 152* 186* 174* 153* 113*      RAI,RIPUDEEP M.D. [facility] 07/01/2015, 2:03 PM  Pager: [phone] Between 7am to 7pm - call Pager - [phone]  After 7pm go to [email or web address] - password [email or web address]  Call night coverage person covering after 7pm

## 2015-07-01 NOTE — Progress Notes (Signed)
*  PRELIMINARY RESULTS* Echocardiogram 2D Echocardiogram has been performed.  Jeryl Columbialliott, Julie Nay 07/01/2015, 3:22 PM

## 2015-07-02 ENCOUNTER — Inpatient Hospital Stay (HOSPITAL_COMMUNITY): Payer: Medicare Other

## 2015-07-02 DIAGNOSIS — I480 Paroxysmal atrial fibrillation: Secondary | ICD-10-CM

## 2015-07-02 DIAGNOSIS — I5033 Acute on chronic diastolic (congestive) heart failure: Secondary | ICD-10-CM

## 2015-07-02 DIAGNOSIS — N182 Chronic kidney disease, stage 2 (mild): Secondary | ICD-10-CM

## 2015-07-02 LAB — GLUCOSE, CAPILLARY
GLUCOSE-CAPILLARY: 152 mg/dL — AB (ref 65–99)
GLUCOSE-CAPILLARY: 176 mg/dL — AB (ref 65–99)
GLUCOSE-CAPILLARY: 161 mg/dL — AB (ref 65–99)
Glucose-Capillary: 211 mg/dL — ABNORMAL HIGH (ref 65–99)

## 2015-07-02 LAB — CBC
HCT: 31.8 % — ABNORMAL LOW (ref 36.0–46.0)
HEMOGLOBIN: 10.1 g/dL — AB (ref 12.0–15.0)
MCH: 29 pg (ref 26.0–34.0)
MCHC: 31.8 g/dL (ref 30.0–36.0)
MCV: 91.4 fL (ref 78.0–100.0)
Platelets: 103 10*3/uL — ABNORMAL LOW (ref 150–400)
RBC: 3.48 MIL/uL — ABNORMAL LOW (ref 3.87–5.11)
RDW: 13.4 % (ref 11.5–15.5)
WBC: 7.8 10*3/uL (ref 4.0–10.5)

## 2015-07-02 LAB — BASIC METABOLIC PANEL
ANION GAP: 5 (ref 5–15)
BUN: 25 mg/dL — ABNORMAL HIGH (ref 6–20)
CALCIUM: 8.5 mg/dL — AB (ref 8.9–10.3)
CO2: 31 mmol/L (ref 22–32)
Chloride: 106 mmol/L (ref 101–111)
Creatinine, Ser: 0.89 mg/dL (ref 0.44–1.00)
GFR, EST NON AFRICAN AMERICAN: 56 mL/min — AB (ref >60)
GLUCOSE: 175 mg/dL — AB (ref 65–99)
Potassium: 4.3 mmol/L (ref 3.5–5.1)
Sodium: 142 mmol/L (ref 135–145)

## 2015-07-02 LAB — URINE CULTURE

## 2015-07-02 MED ORDER — CYCLOBENZAPRINE HCL 5 MG PO TABS
7.5000 mg | ORAL_TABLET | Freq: Three times a day (TID) | ORAL | Status: DC | PRN
  Filled 2015-07-02: qty 1.5

## 2015-07-02 NOTE — Evaluation (Signed)
Physical Therapy Evaluation Patient Details Name: Brianna Chandler MRN: [id number] DOB: 16-Aug-1923 Today's Date: 07/02/2015   History of Present Illness  82yo white female comes to [facility] after 1d myalgia, 2 episodes emesis. In ED pt found to be febrile, have UTI with hematuria, and pulmonary edema. Pt adm,itted for CHF. PMH: DM, HTN, OA (bilat knees), CKD2, CAD, bradycardia, syncope.   Clinical Impression  At evaluation, pt is received semirecumbent in bed upon entry, family/caregiver present. The pt is awake and agreeable to participate. No acute distress noted at this time. The pt is alert and oriented x3, pleasant, conversational, and following simple commands consistently. Caregiver reports no history of falls at home. Pt demonstrate limitations in functional mobility, related to impaired strength, joint range acute on chronic pain, and respiratory function, but presenting near baseline, 25% impaired. Pt received on and remaining on 2L O2 throughout evaluation, with noted desaturation with attempted RA at rest (85%), whereas pt is not on O2 at baseline at home; recovery to 94% on 2L is poor requiring almost 2 minutes.  Patient presenting with impairment of strength, range of motion, balance, oxygen perfusion, and activity tolerance, limiting ability to perform ADL and mobility tasks at  baseline level of function. Patient will benefit from skilled intervention to address the above impairments and limitations, in order to restore to prior level of function, improve patient safety upon discharge, and to decrease falls risk. Caregiver attests that she is available to assist the patient adequately to confidently support DC to home.      Follow Up Recommendations Home health PT    Equipment Recommendations  None recommended by PT    Recommendations for Other Services       Precautions / Restrictions Precautions Precautions: None Restrictions Weight Bearing Restrictions: No      Mobility  Bed  Mobility Overal bed mobility: Needs Assistance Bed Mobility: Supine to Sit     Supine to sit: Mod assist     General bed mobility comments: outside of motor patterns well establisehd at home; transfers out of bed on left side with HOB elevated at baseline.   Transfers Overall transfer level: Needs assistance Equipment used: Rolling walker (2 wheeled) Transfers: Sit to/from Stand Sit to Stand: Min assist         General transfer comment: lift assist  Ambulation/Gait Ambulation/Gait assistance: Min guard Ambulation Distance (Feet): 15 Feet Assistive device: Rolling walker (2 wheeled) Gait Pattern/deviations: Decreased step length - right   Gait velocity interpretation: <1.8 ft/sec, indicative of risk for recurrent falls General Gait Details: tachypnic, HR, SaO2 stable on 2L . very slow and fatigued.   Stairs            Wheelchair Mobility    Modified Rankin (Stroke Patients Only)       Balance Overall balance assessment: No apparent balance deficits (not formally assessed);Modified Independent                                           Pertinent Vitals/Pain Pain Assessment: Faces Faces Pain Scale: Hurts even more Pain Location: bilat knees with walking, a chronic problem worse with 3d immobilization.  Pain Intervention(s): Limited activity within patient's tolerance;Monitored during session;Repositioned    Home Living Family/patient expects to be discharged to:: Private residence Living Arrangements: Spouse/significant other;Other relatives Available Help at Discharge: Family (husband and granddaughter Truddie Hidden)) Type of Home: House Home  Access: Stairs to enter     Home Layout: One level Home Equipment: Dan HumphreysWalker - 2 wheels;Transport chair;Hospital bed      Prior Function Level of Independence: Needs assistance   Gait / Transfers Assistance Needed: household distances with RW. tranport chair for longer distances.  ADL's / Homemaking  Assistance Needed: set up and supervision for bathing, dressing/toileting modI, still cooks.         Hand Dominance        Extremity/Trunk Assessment   Upper Extremity Assessment: Overall WFL for tasks assessed;Generalized weakness           Lower Extremity Assessment: Generalized weakness;Overall [facility] for tasks assessed      Cervical / Trunk Assessment:  (difficulty with bed mobility, but likely at baseline. )  Communication   Communication: No difficulties  Cognition Arousal/Alertness: Awake/alert Behavior During Therapy: WFL for tasks assessed/performed Overall Cognitive Status: History of cognitive impairments - at baseline (mild cognitive deficits. )                      General Comments      Exercises        Assessment/Plan    PT Assessment Patient needs continued PT services  PT Diagnosis Difficulty walking;Abnormality of gait;Generalized weakness   PT Problem List Decreased strength;Decreased range of motion;Decreased activity tolerance;Decreased mobility;Obesity;Cardiopulmonary status limiting activity  PT Treatment Interventions Gait training;Functional mobility training;Therapeutic activities;Therapeutic exercise   PT Goals (Current goals can be found in the Care Plan section) Acute Rehab PT Goals Patient Stated Goal: regain strength  PT Goal Formulation: With patient/family Time For Goal Achievement: 07/16/15 Potential to Achieve Goals: Good    Frequency Min 3X/week   Barriers to discharge Inaccessible home environment      Co-evaluation               End of Session Equipment Utilized During Treatment: Gait belt;Oxygen Activity Tolerance: Patient limited by fatigue;Patient limited by pain Patient left: in chair;with call bell/phone within reach;with family/visitor present Nurse Communication: Mobility status;Other (comment)         Time: 8657-8469: 0918-0943 PT Time Calculation (min) (ACUTE ONLY): 25 min   Charges:   PT  Evaluation $PT Eval Moderate Complexity: 1 Procedure PT Treatments $Therapeutic Activity: 8-22 mins   PT G Codes:       10:04 AM, 07/02/2015 Rosamaria LintsAllan C Leam Madero, PT, DPT PRN [facility]  License # [id number] [phone] (wireless)  [phone] (mobile)

## 2015-07-02 NOTE — Care Management Note (Signed)
Case Management Note  Patient Details  Name: Valetta FullerJean C Sing MRN: [id number] Date of Birth: 01/10/1924  Subjective/Objective:                  Pt admitted with CHF. Pt is from home, lives with husband who requires assistance. Pt has cane and walker to use if needed. Pt has "daughter" who cares for pt and her husband. Daughter currently at bedside. Per daughter pt has had no HH services in the past. Pt has no home home O2 or neb prior to admission. Pt now requiring supplemental O2. PT has recommended HH PT at DC.   Action/Plan: [facility] Pt will need home O2 assessment prior to DC.   Expected Discharge Date:     07/03/2015             Expected Discharge Plan:  Home w Home Health Services  In-House Referral:  NA  Discharge planning Services  CM Consult  Post Acute Care Choice:  Home Health Choice offered to:     DME Arranged:    DME Agency:     HH Arranged:  PT, RN HH Agency:     Status of Service:  In process, will continue to follow  Medicare Important Message Given:    Date Medicare IM Given:    Medicare IM give by:    Date Additional Medicare IM Given:    Additional Medicare Important Message give by:     If discussed at Long Length of Stay Meetings, dates discussed:    Additional Comments:  Malcolm MetroChildress, Selene Peltzer Demske, RN 07/02/2015, 11:26 AM

## 2015-07-02 NOTE — Progress Notes (Signed)
[facility] PROGRESS NOTE  Brianna Chandler RUE:[id number] DOB: 08-Mar-1924 DOA: 06/29/2015 PCP: Louie Boston, MD  Assessment/Plan: 1. Acute on chronic diastolic CHF. Echo 3/7 showed EF of 60-65% with grade 2 diastolic dysfunction. Stable from 9/13 ECHO. Continue lasix. Initial weights not monitored. Strict I&Os. ECHO results below..   2. Acute respiratory failure with hypoxia, secondary to acute on chronic diastolic CHF exacerbation.Currently on 2L [place].Continue to wean oxygen as tolerated. Influenza negative. Repeat CXR today.  3. Right leg pain, likely secondary to edema. Venous doppler negative for DVT. Denies any recent falls or trauma to legs. Continue pain management check Xray of right hip. 4. Proteus Mirabilis UTI/pyelonephritis. Continue Rocephin today, if continues to improve consider transition to oral tomorrow. CT renal stone study revealed minimal right hydroureteronephrosis with perinephric stranding secondary to 6 mm linear calculus at right ureterovesical junction. Continue Flomax. Appreciate urology recommendations.  5. History of paroxsymal atrial fibrillation, s/p pacemaker. [place] vasc 4. Continue Eliquis and Coreg. 6. Essential HTN. Continue Coreq. Amlodipine and Cozaar currently on hold, blood pressures are stable.  7. Hypokalemia, replaced.  Code Status: Full DVT prophylaxis: Eliquis  Family Communication: Family bedside.  Disposition Plan: Anticipate discharge in 24 hours   Consultants:  Urology   Procedures:  ECHO Study Conclusions  - Left ventricle: The cavity size was normal. Wall thickness was  increased in a pattern of moderate LVH. Systolic function was  normal. The estimated ejection fraction was in the range of 60%  to 65%. Wall motion was normal; there were no regional wall  motion abnormalities. Features are consistent with a pseudonormal  left ventricular filling pattern, with concomitant abnormal  relaxation and increased filling pressure  (grade 2 diastolic  dysfunction). - Aortic valve: Mildly calcified annulus. Trileaflet; mildly  thickened leaflets. Valve area (VTI): 1.68 cm^2. Valve area  (Vmax): 1.56 cm^2. - Left atrium: The atrium was severely dilated. - Right atrium: The atrium was mildly dilated. - Atrial septum: No defect or patent foramen ovale was identified. - Pulmonary arteries: Systolic pressure was moderately increased.  PA peak pressure: 44 mm Hg (S). - Technically adequate study.  Antibiotics:  Rocephin 3/7 >>   HPI/Subjective: Doing pretty good. Still has bilateral leg pain that is constant and feels tight. No difficulty breathing. Family bedside reports that the leg pains constant and the patient was in obvious distress. Although the pt has bad knees these legs pains are new. She denies any recent falls.Pain most severe after transitioning from bed to chair.   Objective: Filed Vitals:   07/01/15 2016 07/02/15 0553  BP: 139/46 137/44  Pulse: 61 59  Temp: 98.9 F (37.2 C) 98.8 F (37.1 C)  Resp: 20 18    Intake/Output Summary (Last 24 hours) at 07/02/15 0722 Last data filed at 07/02/15 1478  Gross per 24 hour  Intake 1131.67 ml  Output    925 ml  Net 206.67 ml   Filed Weights   06/29/15 1622 07/01/15 2016 07/02/15 0553  Weight: 94.9 kg (209 lb 3.5 oz) 98.5 kg (217 lb 2.5 oz) 99.3 kg (218 lb 14.7 oz)    Exam:  General: NAD Cardiovascular: RRR, S1, S2  Respiratory: Bilateral crackles  Abdomen: soft, non tender, no distention , bowel sounds normal Musculoskeletal: Trace edema b/l. No tenderness to palpation over the right groin.   Data Reviewed: Basic Metabolic Panel:  Recent Labs Lab 06/29/15 1147 06/29/15 1155 06/30/15 0557 07/01/15 0611 07/02/15 0522  NA 141 144 143 141 142  K 5.7*  3.2* 3.4* 3.8 4.3  CL 107 107 107 106 106  CO2  --  GLUCOSE 122* 123* 169* 173* 175*  BUN 23* 17 22* 27* 25*  CREATININE 0.80 0.74 0.95 0.99 0.89  CALCIUM  --  8.8* 8.4*  8.3* 8.5*   Liver Function Tests:  Recent Labs Lab 06/29/15 1155 06/30/15 0557  AST 28 19  ALT 19 16  ALKPHOS 65 52  BILITOT 1.4* 1.0  PROT 6.0* 5.2*  ALBUMIN 3.4* 2.8*   CBC:  Recent Labs Lab 06/29/15 1147 06/29/15 1155 06/30/15 0557 07/01/15 0611 07/02/15 0522  WBC  --  11.3* 10.4 8.2 7.8  NEUTROABS  --  10.5*  --   --   --   HGB 11.6* 12.4 11.1* 10.0* 10.1*  HCT 34.0* 38.2 33.4* 32.0* 31.8*  MCV  --  88.4 89.5 91.2 91.4  PLT  --  106* 116* 99* 103*   Cardiac Enzymes:  Recent Labs Lab 06/29/15 1330 06/29/15 2333  TROPONINI <0.03 <0.03   BNP (last 3 results)  Recent Labs  06/29/15 1155  BNP 296.0*   CBG:  Recent Labs Lab 06/30/15 1632 06/30/15 2115 07/01/15 0801 07/01/15 1149 07/01/15 1619  GLUCAP 186* 174* 153* 113* 185*    Recent Results (from the past 240 hour(s))  Urine culture     Status: None (Preliminary result)   Collection Time: 06/29/15 11:07 AM  Result Value Ref Range Status   Specimen Description URINE, CATHETERIZED  Final   Special Requests NONE  Final   Culture   Final    >=100,000 COLONIES/mL GRAM NEGATIVE RODS Performed at [facility]    Report Status PENDING  Incomplete     Studies: Dg Abd 1 View  07/01/2015  CLINICAL DATA:  Right ureteral stone. EXAM: ABDOMEN - 1 VIEW COMPARISON:  CT June 30, 2015 FINDINGS: The bowel gas pattern is normal. Extensive bowel content is identified throughout colon. No radio-opaque calculi or other significant radiographic abnormality are seen. The CT noted calcifications right ureterovesicular junction is not definitely seen on x-ray. Degenerative joint changes and scoliosis of the spine are identified. There are pelvic phleboliths. IMPRESSION: The CT noted calcification at the right ureterovesicular junction is not definitely seen on x-ray. Constipation. Electronically Signed   By: Sherian Rein M.D.   On: 07/01/2015 13:48   US Venous Img Lower Bilateral  06/30/2015  CLINICAL DATA:   Bilateral edema and leg pain EXAM: BILATERAL LOWER EXTREMITY VENOUS DOPPLER ULTRASOUND TECHNIQUE: Gray-scale sonography with graded compression, as well as color Doppler and duplex ultrasound were performed to evaluate the lower extremity deep venous systems from the level of the common femoral vein and including the common femoral, femoral, profunda femoral, popliteal and calf veins including the posterior tibial, peroneal and gastrocnemius veins when visible. The superficial great saphenous vein was also interrogated. Spectral Doppler was utilized to evaluate flow at rest and with distal augmentation maneuvers in the common femoral, femoral and popliteal veins. COMPARISON:  None. FINDINGS: RIGHT LOWER EXTREMITY Common Femoral Vein: No evidence of thrombus. Normal compressibility, respiratory phasicity and response to augmentation. Saphenofemoral Junction: No evidence of thrombus. Normal compressibility and flow on color Doppler imaging. Profunda Femoral Vein: No evidence of thrombus. Normal compressibility and flow on color Doppler imaging. Femoral Vein: No evidence of thrombus. Normal compressibility, respiratory phasicity and response to augmentation. Popliteal Vein: No evidence of thrombus. Normal compressibility, respiratory phasicity and response to augmentation. Calf Veins: No evidence of thrombus. Normal compressibility  and flow on color Doppler imaging. Superficial Great Saphenous Vein: No evidence of thrombus. Normal compressibility and flow on color Doppler imaging. Other Findings:  Right lower extremity superficial edema. LEFT LOWER EXTREMITY Common Femoral Vein: No evidence of thrombus. Normal compressibility, respiratory phasicity and response to augmentation. Saphenofemoral Junction: No evidence of thrombus. Normal compressibility and flow on color Doppler imaging. Profunda Femoral Vein: No evidence of thrombus. Normal compressibility and flow on color Doppler imaging. Femoral Vein: No evidence of  thrombus. Normal compressibility, respiratory phasicity and response to augmentation. Popliteal Vein: No evidence of thrombus. Normal compressibility, respiratory phasicity and response to augmentation. Calf Veins: No evidence of thrombus. Normal compressibility and flow on color Doppler imaging. Superficial Great Saphenous Vein: No evidence of thrombus. Normal compressibility and flow on color Doppler imaging. Other Findings:  Left lower extremity superficial edema. IMPRESSION: Sonographic survey of the bilateral lower extremities negative for DVT. Bilateral lower leg edema. Signed, Yvone Neu. Loreta Ave, DO [facility] Radiology Electronically Signed   By: Gilmer Mor D.O.   On: 06/30/2015 11:24   Ct Renal Stone Study  06/30/2015  CLINICAL DATA:  Dysuria, hematuria. EXAM: CT ABDOMEN AND PELVIS WITHOUT CONTRAST TECHNIQUE: Multidetector CT imaging of the abdomen and pelvis was performed following the standard protocol without IV contrast. COMPARISON:  None available currently. FINDINGS: Severe multilevel degenerative disc disease is noted in the lumbar spine. Minimal bilateral posterior basilar subsegmental atelectasis is noted. Small gallstone is noted. No focal abnormality is noted in the liver, spleen or pancreas on these unenhanced images. Adrenal glands are unremarkable. Left kidney and ureter are unremarkable. Minimal right hydroureteronephrosis is noted with perinephric stranding. This appears to be due to 6 mm linear calculus at the right ureterovesical junction. Atherosclerosis of abdominal aorta is noted without aneurysm formation. There is no evidence of bowel obstruction. No abnormal fluid collection is noted. Diverticulosis of sigmoid colon is noted without inflammation. Urinary bladder is decompressed secondary to Foley catheter. IMPRESSION: Solitary gallstone. Minimal right hydroureteronephrosis with perinephric stranding secondary to 6 mm linear calculus  at right ureterovesical junction. Diverticulosis of sigmoid colon is noted without inflammation. Atherosclerosis of abdominal aorta is noted without aneurysm formation. Electronically Signed   By: Lupita Raider, M.D.   On: 06/30/2015 11:54    Scheduled Meds: . apixaban  2.5 mg Oral BID  . atorvastatin  40 mg Oral QHS  . carvedilol  6.25 mg Oral BID WC  . cefTRIAXone (ROCEPHIN)  IV  2 g Intravenous Q24H  . furosemide  40 mg Intravenous Q12H  . insulin aspart  0-9 Units Subcutaneous TID WC  . methocarbamol  500 mg Oral TID  . oxybutynin  5 mg Oral Daily  . potassium chloride  40 mEq Oral Daily  . sodium chloride flush  3 mL Intravenous Q12H  . tamsulosin  0.4 mg Oral Daily   Continuous Infusions: . sodium chloride 10 mL/hr at 06/29/15 1812    Active Problems:   CKD (chronic kidney disease) stage 2, GFR 60-89 ml/min   Mixed hyperlipidemia   Atrial fibrillation (HCC)   CHF (congestive heart failure) (HCC)   CHF exacerbation (HCC)    Time spent: 25 minutes     Erick Blinks, MD  [facility] Pager [phone]. If 7PM-7AM, please contact night-coverage at [email or web address], password [facility] 07/02/2015, 7:22 AM  LOS: 2 days     By signing my name below, I, Zadie Cleverly, attest that this documentation has been prepared under the direction and in  the presence of Erick BlinksJehanzeb Memon, MD. Electronically signed: Zadie CleverlyJessica Augustus, Scribe. 07/02/2015 2:34pm  I, Dr. Erick BlinksJehanzeb Memon, personally performed the services described in this documentaiton. All medical record entries made by the scribe were at my direction and in my presence. I have reviewed the chart and agree that the record reflects my personal performance and is accurate and complete  Erick BlinksJehanzeb Memon, MD, 07/02/2015 2:53 PM

## 2015-07-02 NOTE — Progress Notes (Signed)
ANTIBIOTIC CONSULT NOTE - follow up  Pharmacy Consult for Rocephin Indication: UTI  No Known Allergies  Patient Measurements: Height:  (157.5 cm) Weight: 213 lb 10 oz (96.9 kg) IBW/kg (Calculated) : 50.1  Vital Signs: Temp: 98.6 F (37 C) (03/08 1313) Temp Source: Oral (03/08 1313) BP: 135/54 mmHg (03/08 1313) Pulse Rate: 68 (03/08 1313) Intake/Output from previous day: 03/07 0701 - 03/08 0700 In: 1131.7 [P.O.:480; I.V.:601.7; IV Piggyback:50] Out: 925 [Urine:925] Intake/Output from this shift: Total I/O In: 480 [P.O.:480] Out: -   Labs:  Recent Labs  06/30/15 0557 07/01/15 0611 07/02/15 0522  WBC 10.4 8.2 7.8  HGB 11.1* 10.0* 10.1*  PLT 116* 99* 103*  CREATININE 0.95 0.99 0.89   Estimated Creatinine Clearance: 46.5 mL/min (by C-G formula based on Cr of 0.89). No results for input(s): VANCOTROUGH, VANCOPEAK, VANCORANDOM, GENTTROUGH, GENTPEAK, GENTRANDOM, TOBRATROUGH, TOBRAPEAK, TOBRARND, AMIKACINPEAK, AMIKACINTROU, AMIKACIN in the last 72 hours.   Microbiology: Recent Results (from the past 720 hour(s))  Urine culture     Status: None   Collection Time: 06/29/15 11:07 AM  Result Value Ref Range Status   Specimen Description URINE, CATHETERIZED  Final   Special Requests NONE  Final   Culture   Final    >=100,000 COLONIES/mL PROTEUS MIRABILIS Performed at [facility]    Report Status 07/02/2015 FINAL  Final   Organism ID, Bacteria PROTEUS MIRABILIS  Final      Susceptibility   Proteus mirabilis - MIC*    AMPICILLIN <=2 SENSITIVE Sensitive     CEFAZOLIN <=4 SENSITIVE Sensitive     CEFTRIAXONE <=1 SENSITIVE Sensitive     CIPROFLOXACIN <=0.25 SENSITIVE Sensitive     GENTAMICIN <=1 SENSITIVE Sensitive     IMIPENEM 0.5 SENSITIVE Sensitive     NITROFURANTOIN 64 RESISTANT Resistant     TRIMETH/SULFA <=20 SENSITIVE Sensitive     AMPICILLIN/SULBACTAM <=2 SENSITIVE Sensitive     PIP/TAZO <=4 SENSITIVE Sensitive     * >=100,000 COLONIES/mL PROTEUS  MIRABILIS   Medical History: Past Medical History  Diagnosis Date  . Chronic diastolic CHF (congestive heart failure) (HCC)     LVEF 60-65%, grade 2 diastolic dysfunction, PASP  . Type 2 diabetes mellitus (HCC)   . Essential hypertension, benign   . Osteoarthritis   . Obesity   . CKD (chronic kidney disease) stage 2, GFR 60-89 ml/min   . Coronary atherosclerosis of native coronary artery February 05, 2009    NSTEMI s/p DES prox LAD  . Symptomatic bradycardia     12/2011 s/p MDT Sensia DC PPM, [id number] H  . Syncope    Medications:  Prescriptions prior to admission  Medication Sig Dispense Refill Last Dose  . amLODipine (NORVASC) 5 MG tablet TAKE 1 TABLET BY MOUTH EVERY DAY 30 tablet 6 06/29/2015 at Unknown time  . atorvastatin (LIPITOR) 40 MG tablet Take 1 tablet (40 mg total) by mouth at bedtime. 30 tablet 3 06/28/2015 at Unknown time  . carvedilol (COREG) 12.5 MG tablet TAKE 1 AND 1/2 OF A TABLET BY MOUTH TWICE DAILY WITH A MEAL 90 tablet 6 06/29/2015 at 800  . ELIQUIS 2.5 MG TABS tablet Take 2.5 mg by mouth 2 (two) times daily.   06/29/2015 at 800  . isosorbide mononitrate (IMDUR) 30 MG 24 hr tablet Take 1 tablet (30 mg total) by mouth daily. In the evening 90 tablet 3 06/28/2015 at Unknown time  . losartan (COZAAR) 50 MG tablet Take 50 mg by mouth every morning.  06/29/2015 at Unknown time  . metFORMIN (GLUCOPHAGE) 500 MG tablet Take 500 mg by mouth 2 (two) times daily with a meal.   06/29/2015 at Unknown time  . Multiple Vitamin (MULTIVITAMIN) tablet Take 1 tablet by mouth daily.     06/29/2015 at Unknown time  . oxybutynin (DITROPAN-XL) 5 MG 24 hr tablet Take 5 mg by mouth daily.   06/29/2015 at Unknown time  . traMADol (ULTRAM) 50 MG tablet Take 50 mg by mouth every 4 (four) hours as needed for moderate pain.    06/28/2015 at Unknown time  . benzonatate (TESSALON) 100 MG capsule Take 1 capsule (100 mg total) by mouth every 8 (eight) hours. (Patient not taking: Reported on 06/29/2015) 21 capsule  0   . doxycycline (VIBRAMYCIN) 100 MG capsule Take 1 capsule (100 mg total) by mouth 2 (two) times daily. (Patient not taking: Reported on 06/29/2015) 14 capsule 0   . furosemide (LASIX) 20 MG tablet Take 40-60 mg by mouth daily as needed for fluid. In addition to maintenance dose   unknown  . nitroGLYCERIN (NITROSTAT) 0.4 MG SL tablet Place 0.4 mg under the tongue every 5 (five) minutes x 3 doses as needed for chest pain (MAX 3 TABLETS).    unknown  . Rivaroxaban (XARELTO) 15 MG TABS tablet TAKE 1 TABLET BY MOUTH DAILY WITH A MEAL. 30 tablet 10    Assessment: 82 yo female admitted from ED. Proteus in urine S to Rocephin.  Goal of Therapy:  Eradicate infection  Plan:  Ceftriaxone 1 GM IV every 24 hours Deescalate to PO abx when appropriate Duration of therapy per MD Labs per protocol  Valrie HartHall, Maritsa Hunsucker A 07/02/2015,1:43 PM

## 2015-07-02 NOTE — Plan of Care (Signed)
Problem: Acute Rehab PT Goals(only PT should resolve) Goal: Patient Will Transfer Sit To/From Stand Pt will transfer sit to/from-stand with RW at ModI without loss-of-balance to demonstrate good safety awareness for independent mobility in home.     Goal: Pt Will Ambulate Pt will ambulate with RW at Supervision on RA with SaO2 >88% for a distance greater than [phone] to demonstrate the ability to perform safe household distance ambulation at discharge.

## 2015-07-03 DIAGNOSIS — E782 Mixed hyperlipidemia: Secondary | ICD-10-CM

## 2015-07-03 LAB — BASIC METABOLIC PANEL
ANION GAP: 5 (ref 5–15)
BUN: 23 mg/dL — ABNORMAL HIGH (ref 6–20)
CHLORIDE: 106 mmol/L (ref 101–111)
CO2: 30 mmol/L (ref 22–32)
Calcium: 8.8 mg/dL — ABNORMAL LOW (ref 8.9–10.3)
Creatinine, Ser: 0.83 mg/dL (ref 0.44–1.00)
GFR calc Af Amer: >60 mL/min (ref >60)
GFR calc non Af Amer: >60 mL/min (ref >60)
GLUCOSE: 170 mg/dL — AB (ref 65–99)
POTASSIUM: 4.4 mmol/L (ref 3.5–5.1)
Sodium: 141 mmol/L (ref 135–145)

## 2015-07-03 LAB — GLUCOSE, CAPILLARY
GLUCOSE-CAPILLARY: 221 mg/dL — AB (ref 65–99)
GLUCOSE-CAPILLARY: 192 mg/dL — AB (ref 65–99)
GLUCOSE-CAPILLARY: 235 mg/dL — AB (ref 65–99)
Glucose-Capillary: 156 mg/dL — ABNORMAL HIGH (ref 65–99)

## 2015-07-03 MED ORDER — CEPHALEXIN 250 MG PO CAPS
250.0000 mg | ORAL_CAPSULE | Freq: Three times a day (TID) | ORAL | Status: DC
  Administered 2015-07-03 – 2015-07-04 (×4): 250 mg via ORAL
  Filled 2015-07-03 (×4): qty 1

## 2015-07-03 MED ORDER — MAGNESIUM CITRATE PO SOLN
1.0000 | Freq: Once | ORAL | Status: AC
  Administered 2015-07-03: 1 via ORAL
  Filled 2015-07-03: qty 296

## 2015-07-03 NOTE — Care Management Obs Status (Signed)
MEDICARE OBSERVATION STATUS NOTIFICATION   Patient Details  Name: Brianna Chandler MRN: [id number] Date of Birth: 08/10/1923   Medicare Observation Status Notification Given:  Yes    Malcolm MetroChildress, Haylen Shelnutt Demske, RN 07/03/2015, 3:38 PM

## 2015-07-03 NOTE — Progress Notes (Signed)
Subjective: Patient reports right leg pain and leg cramps. She denies any flank pain. She underwent KUB yesterday which shows a distal calculus in the ureter versus the bladder. The patient has a foley in place due to poor mobility and urinary incontinence. Pt afebrile. Urine culture grew pan sensitive proteus  Objective: Vital signs in last 24 hours: Temp:  [98.6 F (37 C)-98.8 F (37.1 C)] 98.7 F (37.1 C) (03/08 2208) Pulse Rate:  [59-76] 70 (03/08 2208) Resp:  [18-20] 18 (03/08 2208) BP: (133-137)/(44-62) 133/62 mmHg (03/08 2208) SpO2:  [85 %-97 %] 93 % (03/08 2208) Weight:  [96.9 kg (213 lb 10 oz)-99.3 kg (218 lb 14.7 oz)] 96.9 kg (213 lb 10 oz) (03/08 0727)  Intake/Output from previous day: 03/08 0701 - 03/09 0700 In: 886.3 [P.O.:720; I.V.:116.3; IV Piggyback:50] Out: 1000 [Urine:1000] Intake/Output this shift: Total I/O In: 240 [P.O.:240] Out: -   Physical Exam:  General:alert, cooperative and appears stated age GI: soft, non tender, normal bowel sounds, no palpable masses, no organomegaly, no inguinal hernia Female genitalia: not done Extremities: extremities normal, atraumatic, no cyanosis or edema  Lab Results:  Recent Labs  06/30/15 0557 07/01/15 0611 07/02/15 0522  HGB 11.1* 10.0* 10.1*  HCT 33.4* 32.0* 31.8*   BMET  Recent Labs  07/01/15 0611 07/02/15 0522  NA 141 142  K 3.8 4.3  CL 106 106  CO2 29 31  GLUCOSE 173* 175*  BUN 27* 25*  CREATININE 0.99 0.89  CALCIUM 8.3* 8.5*   No results for input(s): LABPT, INR in the last 72 hours. No results for input(s): LABURIN in the last 72 hours. Results for orders placed or performed during the hospital encounter of 06/29/15  Urine culture     Status: None   Collection Time: 06/29/15 11:07 AM  Result Value Ref Range Status   Specimen Description URINE, CATHETERIZED  Final   Special Requests NONE  Final   Culture   Final    >=100,000 COLONIES/mL PROTEUS MIRABILIS [facility]    Report Status 07/02/2015 FINAL  Final   Organism ID, Bacteria PROTEUS MIRABILIS  Final      Susceptibility   Proteus mirabilis - MIC*    AMPICILLIN <=2 SENSITIVE Sensitive     CEFAZOLIN <=4 SENSITIVE Sensitive     CEFTRIAXONE <=1 SENSITIVE Sensitive     CIPROFLOXACIN <=0.25 SENSITIVE Sensitive     GENTAMICIN <=1 SENSITIVE Sensitive     IMIPENEM 0.5 SENSITIVE Sensitive     NITROFURANTOIN 64 RESISTANT Resistant     TRIMETH/SULFA <=20 SENSITIVE Sensitive     AMPICILLIN/SULBACTAM <=2 SENSITIVE Sensitive     PIP/TAZO <=4 SENSITIVE Sensitive     * >=100,000 COLONIES/mL PROTEUS MIRABILIS    Studies/Results: Dg Abd 1 View  07/01/2015  CLINICAL DATA:  Right ureteral stone. EXAM: ABDOMEN - 1 VIEW COMPARISON:  CT June 30, 2015 FINDINGS: The bowel gas pattern is normal. Extensive bowel content is identified throughout colon. No radio-opaque calculi or other significant radiographic abnormality are seen. The CT noted calcifications right ureterovesicular junction is not definitely seen on x-ray. Degenerative joint changes and scoliosis of the spine are identified. There are pelvic phleboliths. IMPRESSION: The CT noted calcification at the right ureterovesicular junction is not definitely seen on x-ray. Constipation. Electronically Signed   By: Sherian ReinWei-Chen  Lin M.D.   On: 07/01/2015 13:48   Dg Chest Port 1 View  07/02/2015  CLINICAL DATA:  CHF.  Right hip pain. EXAM: PORTABLE CHEST 1 VIEW COMPARISON:  06/29/2015  FINDINGS: Pacer with leads at right atrium and right ventricle. No lead discontinuity. Midline trachea. Cardiomegaly accentuated by AP portable technique. Atherosclerosis in the transverse aorta. Mildly degraded exam due to AP portable technique and patient body habitus. No definite pleural fluid. No pneumothorax. Interstitial edema is mild, greater on the right. Mild right base atelectasis. IMPRESSION: Persistent mild congestive heart failure. Developing mild right base atelectasis. Electronically  Signed   By: Jeronimo Greaves M.D.   On: 07/02/2015 17:20   Dg Hip Unilat With Pelvis 2-3 Views Right  07/02/2015  CLINICAL DATA:  Right hip pain without known injury EXAM: DG HIP (WITH OR WITHOUT PELVIS) 2-3V RIGHT COMPARISON:  AP pelvis of July 01, 2015 FINDINGS: The bones are osteopenic. No acute fracture is observed. The articular surfaces of the femoral head and acetabulum are smoothly rounded. The femoral neck, intertrochanteric, and subtrochanteric regions exhibit no acute abnormalities. The visualized portions of the right hemipelvis appear intact. IMPRESSION: The study is quite limited due to osteopenia and scatter effects from overlying pannus. No definite bony abnormality of the right hip is observed. Electronically Signed   By: David  [place] M.D.   On: 07/02/2015 17:27    Assessment/Plan: 82yo with right ureteral calculus, urinary incontinence, and UTI  1. Antibiotics can be changed to keflex  QID for 7 days 2. Foley catheter can be removed and patient should have her urine strained for the stone 3. Continue medical expulsive therapy     LOS: 3 days   MCKENZIE, PATRICK L 07/03/2015, 5:48 AM

## 2015-07-03 NOTE — Progress Notes (Signed)
[facility] PROGRESS NOTE  Brianna Chandler ZOX:[id number] DOB: 1923/07/19 DOA: 06/29/2015 PCP: Louie Boston, MD  Assessment/Plan: 1. Acute on chronic diastolic CHF. Echo 3/7 showed EF of 60-65% with grade 2 diastolic dysfunction. Stable from 9/13 ECHO. Still has evidence of volume overload. Continue lasix. Initial weights not monitored. Strict I&Os. ECHO results below.   2. Acute respiratory failure with hypoxia, secondary to acute on chronic diastolic CHF exacerbation.Currently on 2L [place].Continue to wean oxygen as tolerated. Influenza negative. Repeat chest xray from 3/8 revealed persistent mild CHF and developing mild right base atelectasis.  3. Right leg pain, likely secondary to edema. Venous doppler negative for DVT. Denies any recent falls or trauma to legs. Continue pain management. DG hip revealed no definite bony abnormalities, though study was limited due to osteopenia and scatter effects of overlying pannus. 4. Proteus Mirabilis UTI/pyelonephritis. Discontinue Rocephin and transition to Keflex. CT renal stone study revealed minimal right hydroureteronephrosis with perinephric stranding secondary to 6 mm linear calculus at right ureterovesical junction. Continue Flomax. Remove foley cath and strain urine for stone.  5. History of paroxsymal atrial fibrillation, s/p pacemaker. [place] vasc 4. Continue Eliquis and Coreg. 6. Essential HTN. Continue Coreg. Amlodipine and Cozaar currently on hold, blood pressures remain stable.  7. Hypokalemia, stable.  Code Status: Full DVT prophylaxis: Eliquis  Family Communication: No family bedside. Disposition Plan: Anticipate discharge home when improved  Consultants:  Urology   Procedures:  ECHO Study Conclusions  - Left ventricle: The cavity size was normal. Wall thickness was  increased in a pattern of moderate LVH. Systolic function was  normal. The estimated ejection fraction was in the range of 60%  to 65%. Wall motion was normal;  there were no regional wall  motion abnormalities. Features are consistent with a pseudonormal  left ventricular filling pattern, with concomitant abnormal  relaxation and increased filling pressure (grade 2 diastolic  dysfunction). - Aortic valve: Mildly calcified annulus. Trileaflet; mildly  thickened leaflets. Valve area (VTI): 1.68 cm^2. Valve area  (Vmax): 1.56 cm^2. - Left atrium: The atrium was severely dilated. - Right atrium: The atrium was mildly dilated. - Atrial septum: No defect or patent foramen ovale was identified. - Pulmonary arteries: Systolic pressure was moderately increased.  PA peak pressure: 44 mm Hg (S). - Technically adequate study.  Antibiotics:  Rocephin 3/7 >> 3/9  Keflex 3/9 >>  HPI/Subjective: Feels better today. Leg continues to hurt, but not too bad. Is having difficulty with BM.  Objective: Filed Vitals:   07/02/15 2208 07/03/15 0553  BP: 133/62 128/61  Pulse: 70 66  Temp: 98.7 F (37.1 C) 98.2 F (36.8 C)  Resp: 18 18    Intake/Output Summary (Last 24 hours) at 07/03/15 0739 Last data filed at 07/03/15 0554  Gross per 24 hour  Intake 886.33 ml  Output   2200 ml  Net -1313.67 ml   Filed Weights   07/02/15 0553 07/02/15 0727 07/03/15 0553  Weight: 99.3 kg (218 lb 14.7 oz) 96.9 kg (213 lb 10 oz) 96.71 kg (213 lb 3.3 oz)    Exam:  General: NAD, looks comfortable Cardiovascular: irregular Respiratory: bilateral crackles Abdomen: soft, non tender, no distention , bowel sounds normal  Musculoskeletal: trace edema b/l   Data Reviewed: Basic Metabolic Panel:  Recent Labs Lab 06/29/15 1155 06/30/15 0557 07/01/15 0611 07/02/15 0522 07/03/15 0522  NA 144 143 141 142 141  K 3.2* 3.4* 3.8 4.3 4.4  CL 107 107 106 106 106  CO2 28 30 29  31 30  GLUCOSE 123* 169* 173* 175* 170*  BUN 17 22* 27* 25* 23*  CREATININE 0.74 0.95 0.99 0.89 0.83  CALCIUM 8.8* 8.4* 8.3* 8.5* 8.8*   Liver Function Tests:  Recent Labs Lab  06/29/15 1155 06/30/15 0557  AST 28 19  ALT 19 16  ALKPHOS 65 52  BILITOT 1.4* 1.0  PROT 6.0* 5.2*  ALBUMIN 3.4* 2.8*   CBC:  Recent Labs Lab 06/29/15 1147 06/29/15 1155 06/30/15 0557 07/01/15 0611 07/02/15 0522  WBC  --  11.3* 10.4 8.2 7.8  NEUTROABS  --  10.5*  --   --   --   HGB 11.6* 12.4 11.1* 10.0* 10.1*  HCT 34.0* 38.2 33.4* 32.0* 31.8*  MCV  --  88.4 89.5 91.2 91.4  PLT  --  106* 116* 99* 103*   Cardiac Enzymes:  Recent Labs Lab 06/29/15 1330 06/29/15 2333  TROPONINI <0.03 <0.03   BNP (last 3 results)  Recent Labs  06/29/15 1155  BNP 296.0*   CBG:  Recent Labs Lab 07/02/15 0718 07/02/15 1123 07/02/15 1701 07/02/15 2031 07/03/15 0712  GLUCAP 152* 211* 176* 161* 156*    Recent Results (from the past 240 hour(s))  Urine culture     Status: None   Collection Time: 06/29/15 11:07 AM  Result Value Ref Range Status   Specimen Description URINE, CATHETERIZED  Final   Special Requests NONE  Final   Culture   Final    >=100,000 COLONIES/mL PROTEUS MIRABILIS Performed at [facility]    Report Status 07/02/2015 FINAL  Final   Organism ID, Bacteria PROTEUS MIRABILIS  Final      Susceptibility   Proteus mirabilis - MIC*    AMPICILLIN <=2 SENSITIVE Sensitive     CEFAZOLIN <=4 SENSITIVE Sensitive     CEFTRIAXONE <=1 SENSITIVE Sensitive     CIPROFLOXACIN <=0.25 SENSITIVE Sensitive     GENTAMICIN <=1 SENSITIVE Sensitive     IMIPENEM 0.5 SENSITIVE Sensitive     NITROFURANTOIN 64 RESISTANT Resistant     TRIMETH/SULFA <=20 SENSITIVE Sensitive     AMPICILLIN/SULBACTAM <=2 SENSITIVE Sensitive     PIP/TAZO <=4 SENSITIVE Sensitive     * >=100,000 COLONIES/mL PROTEUS MIRABILIS     Studies: Dg Abd 1 View  07/01/2015  CLINICAL DATA:  Right ureteral stone. EXAM: ABDOMEN - 1 VIEW COMPARISON:  CT June 30, 2015 FINDINGS: The bowel gas pattern is normal. Extensive bowel content is identified throughout colon. No radio-opaque calculi or other  significant radiographic abnormality are seen. The CT noted calcifications right ureterovesicular junction is not definitely seen on x-ray. Degenerative joint changes and scoliosis of the spine are identified. There are pelvic phleboliths. IMPRESSION: The CT noted calcification at the right ureterovesicular junction is not definitely seen on x-ray. Constipation. Electronically Signed   By: Sherian Rein M.D.   On: 07/01/2015 13:48   Dg Chest Port 1 View  07/02/2015  CLINICAL DATA:  CHF.  Right hip pain. EXAM: PORTABLE CHEST 1 VIEW COMPARISON:  06/29/2015 FINDINGS: Pacer with leads at right atrium and right ventricle. No lead discontinuity. Midline trachea. Cardiomegaly accentuated by AP portable technique. Atherosclerosis in the transverse aorta. Mildly degraded exam due to AP portable technique and patient body habitus. No definite pleural fluid. No pneumothorax. Interstitial edema is mild, greater on the right. Mild right base atelectasis. IMPRESSION: Persistent mild congestive heart failure. Developing mild right base atelectasis. Electronically Signed   By: Jeronimo Greaves M.D.   On: 07/02/2015 17:20  Dg Hip Unilat With Pelvis 2-3 Views Right  07/02/2015  CLINICAL DATA:  Right hip pain without known injury EXAM: DG HIP (WITH OR WITHOUT PELVIS) 2-3V RIGHT COMPARISON:  AP pelvis of July 01, 2015 FINDINGS: The bones are osteopenic. No acute fracture is observed. The articular surfaces of the femoral head and acetabulum are smoothly rounded. The femoral neck, intertrochanteric, and subtrochanteric regions exhibit no acute abnormalities. The visualized portions of the right hemipelvis appear intact. IMPRESSION: The study is quite limited due to osteopenia and scatter effects from overlying pannus. No definite bony abnormality of the right hip is observed. Electronically Signed   By: David  [place]az[place] M.D.   On: 07/02/2015 17:27    Scheduled Meds: . apixaban  2.5 mg Oral BID  . atorvastatin  40 mg Oral QHS  .  carvedilol  6.25 mg Oral BID WC  . cefTRIAXone (ROCEPHIN)  IV  2 g Intravenous Q24H  . furosemide  40 mg Intravenous Q12H  . insulin aspart  0-9 Units Subcutaneous TID WC  . methocarbamol  500 mg Oral TID  . oxybutynin  5 mg Oral Daily  . potassium chloride  40 mEq Oral Daily  . sodium chloride flush  3 mL Intravenous Q12H  . tamsulosin  0.4 mg Oral Daily   Continuous Infusions: . sodium chloride 10 mL/hr at 06/29/15 1812    Active Problems:   CKD (chronic kidney disease) stage 2, GFR 60-89 ml/min   Mixed hyperlipidemia   Atrial fibrillation (HCC)   CHF (congestive heart failure) (HCC)   CHF exacerbation (HCC)    Time spent: 25 minutes     Erick BlinksMemon, Elexus Barman, MD  [facility] Pager [phone]. If 7PM-7AM, please contact night-coverage at [email or web address], password [email or web address] 07/03/2015, 7:39 AM  LOS: 3 days

## 2015-07-03 NOTE — Care Management Note (Signed)
Case Management Note  Patient Details  Name: Valetta FullerJean C Redder MRN: [id number] Date of Birth: 04/14/1924   Expected Discharge Date:     07/04/2015             Expected Discharge Plan:  Home w Home Health Services  In-House Referral:  NA  Discharge planning Services  CM Consult  Post Acute Care Choice:  Home Health Choice offered to:  Patient  DME Arranged:    DME Agency:     HH Arranged:  PT, RN HH Agency:  [facility]  Status of Service:  In process, will continue to follow  Medicare Important Message Given:    Date Medicare IM Given:    Medicare IM give by:    Date Additional Medicare IM Given:    Additional Medicare Important Message give by:     If discussed at Long Length of Stay Meetings, dates discussed:    Additional Comments: [facility]. Alroy BailiffLinda Lothian, of [facility], made aware of referral and will obtain pt info from chart. Anticipate DC in 24-48 hours. Pt will still need home O2 eval prior to DC. Will cont to follow.  Malcolm Metrohildress, Clerence Gubser Demske, RN 07/03/2015, 2:36 PM

## 2015-07-04 DIAGNOSIS — E669 Obesity, unspecified: Secondary | ICD-10-CM

## 2015-07-04 DIAGNOSIS — N201 Calculus of ureter: Secondary | ICD-10-CM

## 2015-07-04 DIAGNOSIS — R319 Hematuria, unspecified: Secondary | ICD-10-CM

## 2015-07-04 DIAGNOSIS — E1169 Type 2 diabetes mellitus with other specified complication: Secondary | ICD-10-CM

## 2015-07-04 DIAGNOSIS — J9601 Acute respiratory failure with hypoxia: Secondary | ICD-10-CM

## 2015-07-04 DIAGNOSIS — N390 Urinary tract infection, site not specified: Secondary | ICD-10-CM

## 2015-07-04 DIAGNOSIS — E119 Type 2 diabetes mellitus without complications: Secondary | ICD-10-CM

## 2015-07-04 DIAGNOSIS — I10 Essential (primary) hypertension: Secondary | ICD-10-CM

## 2015-07-04 LAB — BASIC METABOLIC PANEL
Anion gap: 7 (ref 5–15)
BUN: 25 mg/dL — AB (ref 6–20)
CHLORIDE: 104 mmol/L (ref 101–111)
CO2: 34 mmol/L — ABNORMAL HIGH (ref 22–32)
Calcium: 9.1 mg/dL (ref 8.9–10.3)
Creatinine, Ser: 0.85 mg/dL (ref 0.44–1.00)
GFR calc Af Amer: >60 mL/min (ref >60)
GFR calc non Af Amer: 59 mL/min — ABNORMAL LOW (ref >60)
Glucose, Bld: 179 mg/dL — ABNORMAL HIGH (ref 65–99)
POTASSIUM: 4.8 mmol/L (ref 3.5–5.1)
SODIUM: 145 mmol/L (ref 135–145)

## 2015-07-04 LAB — GLUCOSE, CAPILLARY
GLUCOSE-CAPILLARY: 162 mg/dL — AB (ref 65–99)
GLUCOSE-CAPILLARY: 158 mg/dL — AB (ref 65–99)

## 2015-07-04 MED ORDER — TAMSULOSIN HCL 0.4 MG PO CAPS: 0.4000 mg | ORAL_CAPSULE | Freq: Every day | ORAL | Status: DC

## 2015-07-04 MED ORDER — CEPHALEXIN 250 MG PO CAPS: 250.0000 mg | ORAL_CAPSULE | Freq: Three times a day (TID) | ORAL | Status: DC

## 2015-07-04 MED ORDER — CARVEDILOL 12.5 MG PO TABS: ORAL_TABLET | ORAL | Status: DC

## 2015-07-04 MED ORDER — FUROSEMIDE 40 MG PO TABS: 40.0000 mg | ORAL_TABLET | Freq: Two times a day (BID) | ORAL | Status: DC

## 2015-07-04 MED ORDER — POTASSIUM CHLORIDE CRYS ER 20 MEQ PO TBCR: 20.0000 meq | EXTENDED_RELEASE_TABLET | Freq: Every day | ORAL | Status: DC

## 2015-07-04 NOTE — Care Management Important Message (Signed)
Important Message  Patient Details  Name: Brianna Chandler MRN: [id number] Date of Birth: 11/14/1923   Medicare Important Message Given:  Yes    Malcolm MetroChildress, Deijah Spikes Demske, RN 07/04/2015, 8:48 AM

## 2015-07-04 NOTE — Progress Notes (Signed)
Physical Therapy Treatment Patient Details Name: Brianna Chandler MRN: [id number] DOB: [birth year] Today's Date: 07/04/2015    History of Present Illness 82yo white female comes to [facility] after 1d myalgia, 2 episodes emesis. In ED pt found to be febrile, have UTI with hematuria, and pulmonary edema. Pt adm,itted for CHF. PMH: DM, HTN, OA (bilat knees), CKD2, CAD, bradycardia, syncope.     PT Comments    Pt semirecumbent upon therapist entrance with daughter in room and willing to participate with therapy this session.  No reports of pain initially this session though does reports Rt hip has been bothering her some.  Session focus on improving bed mobility and gait training.  Pt required min assistnace with Rt LE with supine to sit and verbal/tactile cueing for handplacement to assist with supine to sit.  Gait training with cueing for posture and to increase stride length to improve gait mechanics and energy conservation.  Pt reported increased Rt hip pain following gait mechanics. RN informed on pain to assist, pt repositioned for comfort with cueing for scooting back in chair.  Pt left in chair with chair alarm set, call bell within reach and daughter in room.    Follow Up Recommendations  Home health PT     Equipment Recommendations  None recommended by PT    Recommendations for Other Services       Precautions / Restrictions Precautions Precautions: None;Fall Restrictions Weight Bearing Restrictions: No    Mobility  Bed Mobility Overal bed mobility: Needs Assistance Bed Mobility: Supine to Sit     Supine to sit: Min assist     General bed mobility comments: Cueing for technique, min assistance with Rt LE for supine to sit  Transfers Overall transfer level: Needs assistance Equipment used: Rolling walker (2 wheeled) Transfers: Sit to/from Stand;Lateral/Scoot Transfers Sit to Stand: Min assist         General transfer comment: cueing for handplacement to assist with sit to  stand and safety.  Verbal cueing to improve technqiue with scooting back into chair at EOS  Ambulation/Gait Ambulation/Gait assistance: Min guard Ambulation Distance (Feet): 26 Feet Assistive device: Rolling walker (2 wheeled) Gait Pattern/deviations: Decreased step length - right   Gait velocity interpretation: <1.8 ft/sec, indicative of risk for recurrent falls General Gait Details: tachypnic, HR, SaO2 stable on 2L . very slow and fatigued.    Stairs            Wheelchair Mobility    Modified Rankin (Stroke Patients Only)       Balance                                    Cognition                            Exercises      General Comments        Pertinent Vitals/Pain Pain Assessment: No/denies pain Pain Score:  (0/10 nonWB increased to 6/10 following gait training Rt anterior hip) Faces Pain Scale: Hurts even more Pain Location: Rt anterior hip/thigh with WB Pain Descriptors / Indicators: Aching Pain Intervention(s): Limited activity within patient's tolerance;Monitored during session;Repositioned    Home Living                      Prior Function  PT Goals (current goals can now be found in the care plan section) Acute Rehab PT Goals Patient Stated Goal: regain strength  PT Goal Formulation: With patient/family Time For Goal Achievement: 07/16/15 Potential to Achieve Goals: Good Progress towards PT goals: Progressing toward goals    Frequency  Min 3X/week    PT Plan      Co-evaluation             End of Session Equipment Utilized During Treatment: Gait belt;Oxygen Activity Tolerance: Patient limited by pain;Patient limited by fatigue Patient left: in chair;with call bell/phone within reach;with chair alarm set;with family/visitor present     Time: 1310-1350 PT Time Calculation (min) (ACUTE ONLY): 40 min  Charges:  $Gait Training: 8-22 mins $Therapeutic Activity: 8-22 mins                     G Codes:      Juel BurrowCockerham, Casey Jo 07/04/2015, 5:15 PM

## 2015-07-04 NOTE — Care Management Note (Signed)
Case Management Note  Patient Details  Name: Brianna Chandler MRN: [id number] Date of Birth: April 24, 1924   Expected Discharge Date:       07/04/2015           Expected Discharge Plan:  [place]  In-House Referral:  NA  Discharge planning Services  CM Consult  Post Acute Care Choice:  Home Health, Durable Medical Equipment Choice offered to:  Patient  DME Arranged:  Oxygen DME Agency:  [place]:  PT, RN [facility] Agency:  [place]  Status of Service:  Completed, signed off  Medicare Important Message Given:  Yes Date Medicare IM Given:    Medicare IM give by:    Date Additional Medicare IM Given:    Additional Medicare Important Message give by:     If discussed at [place] of Stay Meetings, dates discussed:    Additional Comments: Pt has met requirements for home O2. Pt's daughter would like home O2 [facility]. Romualdo Bolk, of [facility], made aware of DME needs and will obtain pt info from chart and deliver port O2 tank to room prior to DC. Pt's daughter aware HH has 48 hours to initiate [facility] services. Anticipate DC home today.   Sherald Barge, RN 07/04/2015, 9:47 AM

## 2015-07-04 NOTE — Progress Notes (Signed)
SATURATION QUALIFICATIONS: (This note is used to comply with regulatory documentation for home oxygen)  Patient Saturations on Room Air at Rest = 94%  Patient Saturations on Room Air while Ambulating = 86%  Patient Saturations on 2 Liters of oxygen while Ambulating = 94%  Please briefly explain why patient needs home oxygen: 

## 2015-07-04 NOTE — Discharge Summary (Signed)
Physician Discharge Summary  Brianna Chandler:[id number] DOB: December 20, 1923 DOA: 06/29/2015  PCP: Louie Boston, MD  Admit date: 06/29/2015 Discharge date: 07/04/2015  Time spent: 40 minutes  Recommendations for Outpatient Follow-up:  1. Follow up with primary care physician in 1-2 weeks   Discharge Diagnoses:  Active Problems:   Essential hypertension, benign   CKD (chronic kidney disease) stage 2, GFR 60-89 ml/min   Mixed hyperlipidemia   Atrial fibrillation (HCC)   CHF (congestive heart failure) (HCC)   Acute on chronic diastolic (congestive) heart failure (HCC)   UTI (urinary tract infection)   Acute respiratory failure with hypoxia (HCC)   Diabetes mellitus type 2 in obese [facility])   Ureteral calculus   Discharge Condition: improving  Diet recommendation: low salt, low carb  Filed Weights   07/02/15 0727 07/03/15 0553 07/04/15 0500  Weight: 96.9 kg (213 lb 10 oz) 96.71 kg (213 lb 3.3 oz) 96.12 kg (211 lb 14.5 oz)    History of present illness:  This is an 82 year old female who presents to the hospital with generalized body aches. She was noted to have 2 episodes of vomiting prior to admission and had dark-colored urine. She was found to have a low-grade fever in the emergency room and UA revealed hematuria. Chest x-ray showed possible pulmonary edema. She was admitted for treatment of CHF exacerbation and UTI.  Hospital Course:  Patient was found to have acute on chronic diastolic congestive heart failure. Echocardiogram showed normal ejection fraction. She was diuresed with intravenous Lasix and has had good urine output. She will be transitioned to oral Lasix. She feels her shortness of breath has improved and peripheral edema has not resolved. She will continue on Lasix 40 mg by mouth twice a day. She should follow-up with her primary care physician in the next week. Home health has been arranged she's been advised to weigh herself daily.  Patient has been having urinary  tract infection with Proteus. CT scan of the abdomen and pelvis indicated a day right ureteral calculus with mild right hydroureteronephrosis. She was seen by urology recommended medical management with medical expulsive therapy. She was started on Flomax as well as IV fluids. Renal function has remained stable. She'll be treated with a course of Keflex for urinary tract infection. She is otherwise afebrile and clinically improved. She is advised to continue to strain her urine after discharge.  Procedures:  ECHO Study Conclusions  - Left ventricle: The cavity size was normal. Wall thickness was  increased in a pattern of moderate LVH. Systolic function was  normal. The estimated ejection fraction was in the range of 60%  to 65%. Wall motion was normal; there were no regional wall  motion abnormalities. Features are consistent with a pseudonormal  left ventricular filling pattern, with concomitant abnormal  relaxation and increased filling pressure (grade 2 diastolic  dysfunction). - Aortic valve: Mildly calcified annulus. Trileaflet; mildly  thickened leaflets. Valve area (VTI): 1.68 cm^2. Valve area  (Vmax): 1.56 cm^2. - Left atrium: The atrium was severely dilated. - Right atrium: The atrium was mildly dilated. - Atrial septum: No defect or patent foramen ovale was identified. - Pulmonary arteries: Systolic pressure was moderately increased.  PA peak pressure: 44 mm Hg (S). - Technically adequate study.  Consultations:  Urology  Discharge Exam: Filed Vitals:   07/04/15 0500 07/04/15 1345  BP: 122/71 134/72  Pulse: 70 74  Temp: 98.2 F (36.8 C) 98.9 F (37.2 C)  Resp: 18 20    General:  NAD Cardiovascular: S1, S2 RRR Respiratory:  CTA B  Discharge Instructions   Discharge Instructions    Diet - low sodium heart healthy    Complete by:  As directed      Increase activity slowly    Complete by:  As directed           Current Discharge Medication List     START taking these medications   Details  cephALEXin (KEFLEX) 250 MG capsule Take 1 capsule (250 mg total) by mouth 4 (four) times daily - after meals and at bedtime. Qty: 18 capsule, Refills: 0    potassium chloride SA (K-DUR,KLOR-CON) 20 MEQ tablet Take 1 tablet (20 mEq total) by mouth daily. Qty: 30 tablet, Refills: 1    tamsulosin (FLOMAX) 0.4 MG CAPS capsule Take 1 capsule (0.4 mg total) by mouth daily. Qty: 30 capsule, Refills: 1      CONTINUE these medications which have CHANGED   Details  carvedilol (COREG) 12.5 MG tablet TAKE 1/2 OF A TABLET BY MOUTH TWICE DAILY WITH A MEAL Qty: 30 tablet, Refills: 6    furosemide (LASIX) 40 MG tablet Take 1 tablet (40 mg total) by mouth 2 (two) times daily. Take one extra tablet daily prn for excess fluid Qty: 60 tablet, Refills: 1      CONTINUE these medications which have NOT CHANGED   Details  atorvastatin (LIPITOR) 40 MG tablet Take 1 tablet (40 mg total) by mouth at bedtime. Qty: 30 tablet, Refills: 3    ELIQUIS 2.5 MG TABS tablet Take 2.5 mg by mouth 2 (two) times daily.    isosorbide mononitrate (IMDUR) 30 MG 24 hr tablet Take 1 tablet (30 mg total) by mouth daily. In the evening Qty: 90 tablet, Refills: 3    losartan (COZAAR) 50 MG tablet Take 50 mg by mouth every morning.     metFORMIN (GLUCOPHAGE) 500 MG tablet Take 500 mg by mouth 2 (two) times daily with a meal.    Multiple Vitamin (MULTIVITAMIN) tablet Take 1 tablet by mouth daily.      oxybutynin (DITROPAN-XL) 5 MG 24 hr tablet Take 5 mg by mouth daily.    traMADol (ULTRAM) 50 MG tablet Take 50 mg by mouth every 4 (four) hours as needed for moderate pain.     benzonatate (TESSALON) 100 MG capsule Take 1 capsule (100 mg total) by mouth every 8 (eight) hours. Qty: 21 capsule, Refills: 0    nitroGLYCERIN (NITROSTAT) 0.4 MG SL tablet Place 0.4 mg under the tongue every 5 (five) minutes x 3 doses as needed for chest pain (MAX 3 TABLETS).       STOP taking these  medications     amLODipine (NORVASC) 5 MG tablet      doxycycline (VIBRAMYCIN) 100 MG capsule      Rivaroxaban (XARELTO) 15 MG TABS tablet        No Known Allergies Follow-up Information    Follow up with [facility].   Contact information:   [address] [place] [id number] [phone]        The results of significant diagnostics from this hospitalization (including imaging, microbiology, ancillary and laboratory) are listed below for reference.    Significant Diagnostic Studies: Dg Abd 1 View  07/01/2015  CLINICAL DATA:  Right ureteral stone. EXAM: ABDOMEN - 1 VIEW COMPARISON:  CT June 30, 2015 FINDINGS: The bowel gas pattern is normal. Extensive bowel content is identified throughout colon. No radio-opaque calculi or other significant  radiographic abnormality are seen. The CT noted calcifications right ureterovesicular junction is not definitely seen on x-ray. Degenerative joint changes and scoliosis of the spine are identified. There are pelvic phleboliths. IMPRESSION: The CT noted calcification at the right ureterovesicular junction is not definitely seen on x-ray. Constipation. Electronically Signed   By: Sherian Rein M.D.   On: 07/01/2015 13:48   US Venous Img Lower Bilateral  06/30/2015  CLINICAL DATA:  Bilateral edema and leg pain EXAM: BILATERAL LOWER EXTREMITY VENOUS DOPPLER ULTRASOUND TECHNIQUE: Gray-scale sonography with graded compression, as well as color Doppler and duplex ultrasound were performed to evaluate the lower extremity deep venous systems from the level of the common femoral vein and including the common femoral, femoral, profunda femoral, popliteal and calf veins including the posterior tibial, peroneal and gastrocnemius veins when visible. The superficial great saphenous vein was also interrogated. Spectral Doppler was utilized to evaluate flow at rest and with distal augmentation maneuvers in the common femoral, femoral and  popliteal veins. COMPARISON:  None. FINDINGS: RIGHT LOWER EXTREMITY Common Femoral Vein: No evidence of thrombus. Normal compressibility, respiratory phasicity and response to augmentation. Saphenofemoral Junction: No evidence of thrombus. Normal compressibility and flow on color Doppler imaging. Profunda Femoral Vein: No evidence of thrombus. Normal compressibility and flow on color Doppler imaging. Femoral Vein: No evidence of thrombus. Normal compressibility, respiratory phasicity and response to augmentation. Popliteal Vein: No evidence of thrombus. Normal compressibility, respiratory phasicity and response to augmentation. Calf Veins: No evidence of thrombus. Normal compressibility and flow on color Doppler imaging. Superficial Great Saphenous Vein: No evidence of thrombus. Normal compressibility and flow on color Doppler imaging. Other Findings:  Right lower extremity superficial edema. LEFT LOWER EXTREMITY Common Femoral Vein: No evidence of thrombus. Normal compressibility, respiratory phasicity and response to augmentation. Saphenofemoral Junction: No evidence of thrombus. Normal compressibility and flow on color Doppler imaging. Profunda Femoral Vein: No evidence of thrombus. Normal compressibility and flow on color Doppler imaging. Femoral Vein: No evidence of thrombus. Normal compressibility, respiratory phasicity and response to augmentation. Popliteal Vein: No evidence of thrombus. Normal compressibility, respiratory phasicity and response to augmentation. Calf Veins: No evidence of thrombus. Normal compressibility and flow on color Doppler imaging. Superficial Great Saphenous Vein: No evidence of thrombus. Normal compressibility and flow on color Doppler imaging. Other Findings:  Left lower extremity superficial edema. IMPRESSION: Sonographic survey of the bilateral lower extremities negative for DVT. Bilateral lower leg edema. Signed, Yvone Neu. Loreta Ave, DO [facility] Electronically Signed   By: Gilmer Mor D.O.   On: 06/30/2015 11:24   Dg Chest Port 1 View  07/02/2015  CLINICAL DATA:  CHF.  Right hip pain. EXAM: PORTABLE CHEST 1 VIEW COMPARISON:  06/29/2015 FINDINGS: Pacer with leads at right atrium and right ventricle. No lead discontinuity. Midline trachea. Cardiomegaly accentuated by AP portable technique. Atherosclerosis in the transverse aorta. Mildly degraded exam due to AP portable technique and patient body habitus. No definite pleural fluid. No pneumothorax. Interstitial edema is mild, greater on the right. Mild right base atelectasis. IMPRESSION: Persistent mild congestive heart failure. Developing mild right base atelectasis. Electronically Signed   By: Jeronimo Greaves M.D.   On: 07/02/2015 17:20   Dg Chest Portable 1 View  06/29/2015  CLINICAL DATA:  Arrived Via EMS. Body aches, discs urea and foul-smelling urine EXAM: PORTABLE CHEST 1 VIEW COMPARISON:  05/02/2015 FINDINGS: There is a left chest wall ICD with leads in the right atrial appendage and right ventricle.  Moderate cardiac enlargement is noted. Aortic atherosclerosis noted There is mild interstitial edema. No significant pleural effusion or pneumothorax. No airspace consolidation. IMPRESSION: Cardiac enlargement and pulmonary edema. Electronically Signed   By: Signa Kell M.D.   On: 06/29/2015 10:49   Ct Renal Stone Study  06/30/2015  CLINICAL DATA:  Dysuria, hematuria. EXAM: CT ABDOMEN AND PELVIS WITHOUT CONTRAST TECHNIQUE: Multidetector CT imaging of the abdomen and pelvis was performed following the standard protocol without IV contrast. COMPARISON:  None available currently. FINDINGS: Severe multilevel degenerative disc disease is noted in the lumbar spine. Minimal bilateral posterior basilar subsegmental atelectasis is noted. Small gallstone is noted. No focal abnormality is noted in the liver, spleen or pancreas on these unenhanced images. Adrenal glands are  unremarkable. Left kidney and ureter are unremarkable. Minimal right hydroureteronephrosis is noted with perinephric stranding. This appears to be due to 6 mm linear calculus at the right ureterovesical junction. Atherosclerosis of abdominal aorta is noted without aneurysm formation. There is no evidence of bowel obstruction. No abnormal fluid collection is noted. Diverticulosis of sigmoid colon is noted without inflammation. Urinary bladder is decompressed secondary to Foley catheter. IMPRESSION: Solitary gallstone. Minimal right hydroureteronephrosis with perinephric stranding secondary to 6 mm linear calculus at right ureterovesical junction. Diverticulosis of sigmoid colon is noted without inflammation. Atherosclerosis of abdominal aorta is noted without aneurysm formation. Electronically Signed   By: Lupita Raider, M.D.   On: 06/30/2015 11:54   Dg Hip Unilat With Pelvis 2-3 Views Right  07/02/2015  CLINICAL DATA:  Right hip pain without known injury EXAM: DG HIP (WITH OR WITHOUT PELVIS) 2-3V RIGHT COMPARISON:  AP pelvis of July 01, 2015 FINDINGS: The bones are osteopenic. No acute fracture is observed. The articular surfaces of the femoral head and acetabulum are smoothly rounded. The femoral neck, intertrochanteric, and subtrochanteric regions exhibit no acute abnormalities. The visualized portions of the right hemipelvis appear intact. IMPRESSION: The study is quite limited due to osteopenia and scatter effects from overlying pannus. No definite bony abnormality of the right hip is observed. Electronically Signed   By: David  [place] M.D.   On: 07/02/2015 17:27    Microbiology: Recent Results (from the past 240 hour(s))  Urine culture     Status: None   Collection Time: 06/29/15 11:07 AM  Result Value Ref Range Status   Specimen Description URINE, CATHETERIZED  Final   Special Requests NONE  Final   Culture   Final    >=100,000 COLONIES/mL PROTEUS MIRABILIS Performed at [facility]     Report Status 07/02/2015 FINAL  Final   Organism ID, Bacteria PROTEUS MIRABILIS  Final      Susceptibility   Proteus mirabilis - MIC*    AMPICILLIN <=2 SENSITIVE Sensitive     CEFAZOLIN <=4 SENSITIVE Sensitive     CEFTRIAXONE <=1 SENSITIVE Sensitive     CIPROFLOXACIN <=0.25 SENSITIVE Sensitive     GENTAMICIN <=1 SENSITIVE Sensitive     IMIPENEM 0.5 SENSITIVE Sensitive     NITROFURANTOIN 64 RESISTANT Resistant     TRIMETH/SULFA <=20 SENSITIVE Sensitive     AMPICILLIN/SULBACTAM <=2 SENSITIVE Sensitive     PIP/TAZO <=4 SENSITIVE Sensitive     * >=100,000 COLONIES/mL PROTEUS MIRABILIS     Labs: Basic Metabolic Panel:  Recent Labs Lab 06/30/15 0557 07/01/15 0611 07/02/15 0522 07/03/15 0522 07/04/15 0556  NA 143 141 142 141 145  K 3.4* 3.8 4.3 4.4 4.8  CL 107 106 106 106 104  CO2 30 29  31 30 34*  GLUCOSE 169* 173* 175* 170* 179*  BUN 22* 27* 25* 23* 25*  CREATININE 0.95 0.99 0.89 0.83 0.85  CALCIUM 8.4* 8.3* 8.5* 8.8* 9.1   Liver Function Tests:  Recent Labs Lab 06/29/15 1155 06/30/15 0557  AST 28 19  ALT 19 16  ALKPHOS 65 52  BILITOT 1.4* 1.0  PROT 6.0* 5.2*  ALBUMIN 3.4* 2.8*   No results for input(s): LIPASE, AMYLASE in the last 168 hours. No results for input(s): AMMONIA in the last 168 hours. CBC:  Recent Labs Lab 06/29/15 1147 06/29/15 1155 06/30/15 0557 07/01/15 0611 07/02/15 0522  WBC  --  11.3* 10.4 8.2 7.8  NEUTROABS  --  10.5*  --   --   --   HGB 11.6* 12.4 11.1* 10.0* 10.1*  HCT 34.0* 38.2 33.4* 32.0* 31.8*  MCV  --  88.4 89.5 91.2 91.4  PLT  --  106* 116* 99* 103*   Cardiac Enzymes:  Recent Labs Lab 06/29/15 1330 06/29/15 2333  TROPONINI <0.03 <0.03   BNP: BNP (last 3 results)  Recent Labs  06/29/15 1155  BNP 296.0*    ProBNP (last 3 results) No results for input(s): PROBNP in the last 8760 hours.  CBG:  Recent Labs Lab 07/03/15 1138 07/03/15 1621 07/03/15 2055 07/04/15 0700 07/04/15 1101  GLUCAP 221* 192* 235*  162* 158*       Signed:  Shandel Busic MD.  [facility] 07/04/2015, 4:03 PM

## 2015-07-04 NOTE — Care Management Important Message (Signed)
Important Message  Patient Details  Name: Valetta FullerJean C Horton MRN: [id number] Date of Birth: 06/30/1923   Medicare Important Message Given:  Yes    Malcolm MetroChildress, Natilee Gauer Demske, RN 07/04/2015, 9:47 AM

## 2015-07-12 ENCOUNTER — Emergency Department (HOSPITAL_COMMUNITY)
Admission: EM | Admit: 2015-07-12 | Discharge: 2015-07-12 | Disposition: A | Discharge status: Home / Self Care | Payer: Medicare Other | Attending: Emergency Medicine | Admitting: Emergency Medicine

## 2015-07-12 ENCOUNTER — Emergency Department (HOSPITAL_COMMUNITY): Payer: Medicare Other

## 2015-07-12 ENCOUNTER — Encounter (HOSPITAL_COMMUNITY): Payer: Self-pay | Admitting: Emergency Medicine

## 2015-07-12 DIAGNOSIS — E119 Type 2 diabetes mellitus without complications: Secondary | ICD-10-CM | POA: Insufficient documentation

## 2015-07-12 DIAGNOSIS — M79661 Pain in right lower leg: Secondary | ICD-10-CM

## 2015-07-12 DIAGNOSIS — Z7984 Long term (current) use of oral hypoglycemic drugs: Secondary | ICD-10-CM | POA: Diagnosis not present

## 2015-07-12 DIAGNOSIS — I5032 Chronic diastolic (congestive) heart failure: Secondary | ICD-10-CM | POA: Insufficient documentation

## 2015-07-12 DIAGNOSIS — E669 Obesity, unspecified: Secondary | ICD-10-CM | POA: Diagnosis not present

## 2015-07-12 DIAGNOSIS — N182 Chronic kidney disease, stage 2 (mild): Secondary | ICD-10-CM | POA: Insufficient documentation

## 2015-07-12 DIAGNOSIS — M1990 Unspecified osteoarthritis, unspecified site: Secondary | ICD-10-CM | POA: Diagnosis not present

## 2015-07-12 DIAGNOSIS — I130 Hypertensive heart and chronic kidney disease with heart failure and stage 1 through stage 4 chronic kidney disease, or unspecified chronic kidney disease: Secondary | ICD-10-CM | POA: Diagnosis not present

## 2015-07-12 DIAGNOSIS — M7989 Other specified soft tissue disorders: Secondary | ICD-10-CM

## 2015-07-12 DIAGNOSIS — Z79899 Other long term (current) drug therapy: Secondary | ICD-10-CM | POA: Diagnosis not present

## 2015-07-12 DIAGNOSIS — R2241 Localized swelling, mass and lump, right lower limb: Secondary | ICD-10-CM | POA: Diagnosis not present

## 2015-07-12 DIAGNOSIS — R0602 Shortness of breath: Secondary | ICD-10-CM | POA: Diagnosis present

## 2015-07-12 LAB — CBC WITH DIFFERENTIAL/PLATELET
BASOS ABS: 0 10*3/uL (ref 0.0–0.1)
BASOS PCT: 0 %
EOS ABS: 0.1 10*3/uL (ref 0.0–0.7)
Eosinophils Relative: 1 %
HEMATOCRIT: 29.5 % — AB (ref 36.0–46.0)
Hemoglobin: 9.3 g/dL — ABNORMAL LOW (ref 12.0–15.0)
Lymphocytes Relative: 10 %
Lymphs Abs: 0.9 10*3/uL (ref 0.7–4.0)
MCH: 28.8 pg (ref 26.0–34.0)
MCHC: 31.5 g/dL (ref 30.0–36.0)
MCV: 91.3 fL (ref 78.0–100.0)
MONO ABS: 0.4 10*3/uL (ref 0.1–1.0)
Monocytes Relative: 5 %
NEUTROS ABS: 7.4 10*3/uL (ref 1.7–7.7)
Neutrophils Relative %: 85 %
PLATELETS: 167 10*3/uL (ref 150–400)
RBC: 3.23 MIL/uL — ABNORMAL LOW (ref 3.87–5.11)
RDW: 13.5 % (ref 11.5–15.5)
WBC: 8.7 10*3/uL (ref 4.0–10.5)

## 2015-07-12 LAB — COMPREHENSIVE METABOLIC PANEL
ALBUMIN: 3.3 g/dL — AB (ref 3.5–5.0)
ALT: 20 U/L (ref 14–54)
AST: 31 U/L (ref 15–41)
Alkaline Phosphatase: 65 U/L (ref 38–126)
Anion gap: 9 (ref 5–15)
BILIRUBIN TOTAL: 0.9 mg/dL (ref 0.3–1.2)
BUN: 21 mg/dL — AB (ref 6–20)
CHLORIDE: 102 mmol/L (ref 101–111)
CO2: 30 mmol/L (ref 22–32)
Calcium: 9.4 mg/dL (ref 8.9–10.3)
Creatinine, Ser: 0.83 mg/dL (ref 0.44–1.00)
GFR calc Af Amer: >60 mL/min (ref >60)
GFR calc non Af Amer: >60 mL/min (ref >60)
GLUCOSE: 144 mg/dL — AB (ref 65–99)
POTASSIUM: 3.9 mmol/L (ref 3.5–5.1)
SODIUM: 141 mmol/L (ref 135–145)
Total Protein: 6.5 g/dL (ref 6.5–8.1)

## 2015-07-12 LAB — I-STAT CG4 LACTIC ACID, ED: LACTIC ACID, VENOUS: 2.7 mmol/L — AB (ref 0.5–2.0)

## 2015-07-12 LAB — BRAIN NATRIURETIC PEPTIDE: B Natriuretic Peptide: 136 pg/mL — ABNORMAL HIGH (ref 0.0–100.0)

## 2015-07-12 LAB — TYPE AND SCREEN
ABO/RH(D): O POS
ANTIBODY SCREEN: NEGATIVE

## 2015-07-12 LAB — PROTIME-INR
INR: 2.91 — ABNORMAL HIGH (ref 0.00–1.49)
Prothrombin Time: 29.9 seconds — ABNORMAL HIGH (ref 11.6–15.2)

## 2015-07-12 LAB — TROPONIN I

## 2015-07-12 LAB — APTT: aPTT: 41 seconds — ABNORMAL HIGH (ref 24–37)

## 2015-07-12 MED ORDER — TRAMADOL HCL 50 MG PO TABS
50.0000 mg | ORAL_TABLET | Freq: Once | ORAL | Status: AC
  Administered 2015-07-12: 50 mg via ORAL
  Filled 2015-07-12: qty 1

## 2015-07-12 NOTE — ED Notes (Signed)
PT c/o SOB on exertion worsening x1 day with recent hospitalization on 06/29/15 for CHF per family. Pt wears oxygen 2 L per n/c continuously since hospitalization. PT also concerned about bruising to left lower leg since 07/06/15 with no known injury.

## 2015-07-12 NOTE — ED Provider Notes (Signed)
CSN: [id number]     Arrival date & time 07/12/15  1242 History   First MD Initiated Contact with Patient 07/12/15 1255     Chief Complaint  Patient presents with  . Shortness of Breath     (Consider location/radiation/quality/duration/timing/severity/associated sxs/prior Treatment) HPI Comments: Information obtained from the patient, her daughter, the medical record.  The patient was admitted on March 5 for acute congestive heart failure with fluid retention and overload. She was diuresed in the hospital. At that time she also had treatment and evaluation for DVT which was negative by ultrasound, echocardiogram which showed grade 2 diastolic dysfunction, chest x-ray which showed persistent pulmonary edema. She had an x-ray of her right hip secondary to pain, this was overall negative, she has been home and doing okay until her daughter saw her today and noticed that she had some bruising of her right leg and was having some recurrent shortness of breath. The patient states that she has some discomfort in her right leg but has noticed some bruising and swelling of that leg. The patient is on oral anticoagulant medications, she does have a history of a pacemaker for a history of syncope with symptomatically bradycardia.  Patient is a 82 y.o. female presenting with shortness of breath. The history is provided by the patient, a relative and medical records.  Shortness of Breath   Past Medical History  Diagnosis Date  . Chronic diastolic CHF (congestive heart failure) (HCC)     LVEF 60-65%, grade 2 diastolic dysfunction, PASP  . Type 2 diabetes mellitus (HCC)   . Essential hypertension, benign   . Osteoarthritis   . Obesity   . CKD (chronic kidney disease) stage 2, GFR 60-89 ml/min   . Coronary atherosclerosis of native coronary artery February 05, 2009    NSTEMI s/p DES prox LAD  . Symptomatic bradycardia     12/2011 s/p MDT Sensia DC PPM, [id number] H  . Syncope    Past Surgical  History  Procedure Laterality Date  . Total abdominal hysterectomy    . Cataract extraction    . Pacemaker insertion    . Slt laser application Left 11/13/2012    Procedure: SLT LASER APPLICATION;  Surgeon: Susa Simmonds, MD;  Location: [facility];  Service: Ophthalmology;  Laterality: Left;  . Permanent pacemaker insertion Left 01/12/2012    Procedure: PERMANENT PACEMAKER INSERTION;  Surgeon: Marinus Maw, MD;  Location: [facility] CATH LAB;  Service: Cardiovascular;  Laterality: Left;   Family History  Problem Relation Age of Onset  . Diabetes    . Hypertension    . Coronary artery disease     Social History  Substance Use Topics  . Smoking status: Never Smoker   . Smokeless tobacco: Former Neurosurgeon    Types: Snuff    Quit date: 04/26/1990  . Alcohol Use: No   OB History    No data available     Review of Systems  Respiratory: Positive for shortness of breath.   All other systems reviewed and are negative.     Allergies  Review of patient's allergies indicates no known allergies.  Home Medications   Prior to Admission medications   Medication Sig Start Date End Date Taking? Authorizing Provider  amLODipine (NORVASC) 5 MG tablet Take 1 tablet by mouth daily. 07/11/15  Yes Historical Provider, MD  atorvastatin (LIPITOR) 40 MG tablet Take 1 tablet (40 mg total) by mouth at bedtime. 09/14/11  Yes June Leap, MD  carvedilol (COREG)  12.5 MG tablet TAKE 1/2 OF A TABLET BY MOUTH TWICE DAILY WITH A MEAL 07/04/15  Yes Erick BlinksJehanzeb Memon, MD  furosemide (LASIX) 40 MG tablet Take 1 tablet (40 mg total) by mouth 2 (two) times daily. Take one extra tablet daily prn for excess fluid 07/04/15  Yes Erick BlinksJehanzeb Memon, MD  isosorbide mononitrate (IMDUR) 30 MG 24 hr tablet Take 1 tablet (30 mg total) by mouth daily. In the evening 03/10/15  Yes Jonelle SidleSamuel G McDowell, MD  losartan (COZAAR) 50 MG tablet Take 50 mg by mouth every morning.    Yes Historical Provider, MD  metFORMIN (GLUCOPHAGE) 500 MG tablet Take  500 mg by mouth 2 (two) times daily with a meal.   Yes Historical Provider, MD  Multiple Vitamin (MULTIVITAMIN) tablet Take 1 tablet by mouth daily.     Yes Historical Provider, MD  oxybutynin (DITROPAN-XL) 5 MG 24 hr tablet Take 5 mg by mouth daily. 06/11/15  Yes Historical Provider, MD  potassium chloride SA (K-DUR,KLOR-CON) 20 MEQ tablet Take 1 tablet (20 mEq total) by mouth daily. 07/04/15  Yes Erick BlinksJehanzeb Memon, MD  tamsulosin (FLOMAX) 0.4 MG CAPS capsule Take 1 capsule (0.4 mg total) by mouth daily. 07/04/15  Yes Erick BlinksJehanzeb Memon, MD  traMADol (ULTRAM) 50 MG tablet Take 50 mg by mouth every 4 (four) hours as needed for moderate pain.    Yes Historical Provider, MD  XARELTO 15 MG TABS tablet Take 1 tablet by mouth daily. 07/11/15  Yes Historical Provider, MD  benzonatate (TESSALON) 100 MG capsule Take 1 capsule (100 mg total) by mouth every 8 (eight) hours. Patient not taking: Reported on 06/29/2015 05/02/15   Azalia BilisKevin Campos, MD  cephALEXin (KEFLEX) 250 MG capsule Take 1 capsule (250 mg total) by mouth 4 (four) times daily - after meals and at bedtime. Patient not taking: Reported on 07/12/2015 07/04/15   Erick BlinksJehanzeb Memon, MD  nitroGLYCERIN (NITROSTAT) 0.4 MG SL tablet Place 0.4 mg under the tongue every 5 (five) minutes x 3 doses as needed for chest pain (MAX 3 TABLETS).     Historical Provider, MD   BP 124/90 mmHg  Pulse 61  Temp(Src) 99 F (37.2 C) (Oral)  Resp 20  Ht 5\' 2"  (1.575 m)  Wt 201 lb (91.173 kg)  BMI 36.75 kg/m2  SpO2 98% Physical Exam  Constitutional: She appears well-developed and well-nourished. No distress.  HENT:  Head: Normocephalic and atraumatic.  Mouth/Throat: Oropharynx is clear and moist. No oropharyngeal exudate.  Eyes: Conjunctivae and EOM are normal. Pupils are equal, round, and reactive to light. Right eye exhibits no discharge. Left eye exhibits no discharge. No scleral icterus.  Neck: Normal range of motion. Neck supple. No JVD present. No thyromegaly present.   Cardiovascular: Normal rate, regular rhythm, normal heart sounds and intact distal pulses.  Exam reveals no gallop and no friction rub.   No murmur heard. Pulmonary/Chest: Effort normal. No respiratory distress. She has no wheezes. She has rales (  Focal rales at the left base, mild diffuse wheezing).  Abdominal: Soft. Bowel sounds are normal. She exhibits no distension and no mass. There is no tenderness.  Musculoskeletal: Normal range of motion. She exhibits tenderness ( Minimal tenderness to the right lower extremity in the calf and the foot, there is bruising extensively. The left thigh through the ankle, this does collect dependently in the posterior compartments, supple joints of the hip knee and ankle when examined ). She exhibits no edema.  Lymphadenopathy:    She has no cervical adenopathy.  Neurological: She is alert. Coordination normal.  Skin: Skin is warm and dry. No rash noted. No erythema.  Psychiatric: She has a normal mood and affect. Her behavior is normal.  Nursing note and vitals reviewed.   ED Course  Procedures (including critical care time) Labs Review Labs Reviewed  CBC WITH DIFFERENTIAL/PLATELET - Abnormal; Notable for the following:    RBC 3.23 (*)    Hemoglobin 9.3 (*)    HCT 29.5 (*)    All other components within normal limits  COMPREHENSIVE METABOLIC PANEL - Abnormal; Notable for the following:    Glucose, Bld 144 (*)    BUN 21 (*)    Albumin 3.3 (*)    All other components within normal limits  BRAIN NATRIURETIC PEPTIDE - Abnormal; Notable for the following:    B Natriuretic Peptide 136.0 (*)    All other components within normal limits  APTT - Abnormal; Notable for the following:    aPTT 41 (*)    All other components within normal limits  PROTIME-INR - Abnormal; Notable for the following:    Prothrombin Time 29.9 (*)    INR 2.91 (*)    All other components within normal limits  I-STAT CG4 LACTIC ACID, ED - Abnormal; Notable for the following:     Lactic Acid, Venous 2.70 (*)    All other components within normal limits  TROPONIN I  TYPE AND SCREEN    Imaging Review Dg Chest 2 View  07/12/2015  CLINICAL DATA:  Shortness of Breath EXAM: CHEST  2 VIEW COMPARISON:  07/02/2015 FINDINGS: Cardiac shadow remains enlarged. A pacing device is again seen and stable. The lungs are well aerated bilaterally without focal infiltrate or sizable effusion. No acute bony abnormality is seen. IMPRESSION: No active cardiopulmonary disease. Electronically Signed   By: Alcide Clever M.D.   On: 07/12/2015 14:31   Dg Tibia/fibula Right  07/12/2015  CLINICAL DATA:  Right leg pain and bruising, no known injury, initial encounter EXAM: RIGHT TIBIA AND FIBULA - 2 VIEW COMPARISON:  None. FINDINGS: Degenerative changes of the right knee joint are noted. Some generalized soft tissue swelling is noted about the ankle. No acute fracture or dislocation is seen. No other soft tissue abnormality is noted. IMPRESSION: Degenerative changes in the knee joint. Mild soft tissue swelling is noted without acute bony abnormality. Electronically Signed   By: Alcide Clever M.D.   On: 07/12/2015 14:38   Dg Knee Complete 4 Views Right  07/12/2015  CLINICAL DATA:  Right leg bruising, initial encounter, no known injury EXAM: RIGHT KNEE - COMPLETE 4+ VIEW COMPARISON:  None. FINDINGS: Degenerative changes are noted in all 3 joint compartments. A small joint effusion is noted. No acute fracture or dislocation is seen. No other soft tissue abnormality is noted. IMPRESSION: Small joint effusion.  No acute fracture noted. Electronically Signed   By: Alcide Clever M.D.   On: 07/12/2015 14:37   Dg Femur, Min 2 Views Right  07/12/2015  CLINICAL DATA:  Right leg pain and bruising, no known injury, initial encounter EXAM: RIGHT FEMUR 2 VIEWS COMPARISON:  None. FINDINGS: Degenerative changes of the hip joint and knee joint are seen. No acute fracture or dislocation is noted. Small knee joint effusion is  noted. No other focal abnormality is seen. IMPRESSION: Small joint effusion.  No acute fracture or dislocation is noted. Electronically Signed   By: Alcide Clever M.D.   On: 07/12/2015 14:36   I have personally reviewed and evaluated these  images and lab results as part of my medical decision-making.   EKG Interpretation   Date/Time:  Saturday July 12 2015 13:02:58 EDT Ventricular Rate:  64 PR Interval:  178 QRS Duration: 114 QT Interval:  404 QTC Calculation: 417 R Axis:   -56 Text Interpretation:  Atrial-paced rhythm LAD, consider left anterior  fascicular block since last tracing no significant change Confirmed by  Kima Malenfant  MD, Jong Rickman ([id number]) on 07/12/2015 1:12:21 PM      MDM   Final diagnoses:  Pain and swelling of lower leg, right    The patient has an abnormal exam, her vital signs are not remarkable, the etiology of the bruising is unclear. It appears that she had a complete ultrasound evaluation of her lower extremities without signs of DVT, she has pulmonary edema in the past and with her mild shortness of breath and rales at the left base that would entertain pulmonary edema, pneumonia, atelectasis. X-rays of the right lower extremity to be obtained, chest x-ray, labs.  I have discussed the care with the patient and the daughter - we have discussed all of her labs including her mild anemia - her xrays and her BNP - they are in agreement with the plan of outpt f/u - I have offered her admission to the hospital but both she and her daughter would like to be d/c and go home - they have f/u on Wednesday with Dr. Margo Common and agree to have CBC recheck to monitor anemia - recommended compression stocking in the mean time. - they are in agreement.  Recheck VS prior to d/c shows no tachycardia, no hypotension, no fevers and no increased WOB. No signs of PNA on the xray, no fractures on the skeletal imaging.  I have personally viewed and interpreted the imaging and agree with radiologist  interpretation.   Eber Hong, MD 07/12/15 [phone]

## 2015-07-12 NOTE — ED Provider Notes (Signed)
As the pt did not want to stay - I asked the daughter to bring her back tomorrow or Monday for a recheck on Hemoglobin and clinical exam of the leg - they are agreeable to this rather than waiting until Wednesday  Eber HongBrian Brenyn Petrey, MD 07/12/15 1527

## 2015-07-12 NOTE — ED Notes (Signed)
MD at bedside. 

## 2015-07-12 NOTE — ED Notes (Signed)
Patient given compression stocking per MD verbal order. Patient with no complaints at this time. Respirations even and unlabored. Skin warm/dry. Discharge instructions reviewed with patient at this time. Patient given opportunity to voice concerns/ask questions. Patient discharged at this time and left Emergency Department with via wheelchair.

## 2015-07-14 ENCOUNTER — Emergency Department (HOSPITAL_COMMUNITY)
Admission: EM | Admit: 2015-07-14 | Discharge: 2015-07-14 | Disposition: A | Discharge status: Home / Self Care | Payer: Medicare Other | Attending: Emergency Medicine | Admitting: Emergency Medicine

## 2015-07-14 ENCOUNTER — Emergency Department (HOSPITAL_COMMUNITY): Payer: Medicare Other

## 2015-07-14 ENCOUNTER — Encounter (HOSPITAL_COMMUNITY): Payer: Self-pay | Admitting: Emergency Medicine

## 2015-07-14 DIAGNOSIS — Z7984 Long term (current) use of oral hypoglycemic drugs: Secondary | ICD-10-CM | POA: Diagnosis not present

## 2015-07-14 DIAGNOSIS — Z87891 Personal history of nicotine dependence: Secondary | ICD-10-CM | POA: Insufficient documentation

## 2015-07-14 DIAGNOSIS — Y939 Activity, unspecified: Secondary | ICD-10-CM | POA: Insufficient documentation

## 2015-07-14 DIAGNOSIS — R799 Abnormal finding of blood chemistry, unspecified: Secondary | ICD-10-CM | POA: Diagnosis present

## 2015-07-14 DIAGNOSIS — M1990 Unspecified osteoarthritis, unspecified site: Secondary | ICD-10-CM | POA: Insufficient documentation

## 2015-07-14 DIAGNOSIS — Y999 Unspecified external cause status: Secondary | ICD-10-CM | POA: Insufficient documentation

## 2015-07-14 DIAGNOSIS — X58XXXA Exposure to other specified factors, initial encounter: Secondary | ICD-10-CM | POA: Insufficient documentation

## 2015-07-14 DIAGNOSIS — Y929 Unspecified place or not applicable: Secondary | ICD-10-CM | POA: Diagnosis not present

## 2015-07-14 DIAGNOSIS — S8011XD Contusion of right lower leg, subsequent encounter: Secondary | ICD-10-CM

## 2015-07-14 DIAGNOSIS — Z7901 Long term (current) use of anticoagulants: Secondary | ICD-10-CM | POA: Insufficient documentation

## 2015-07-14 DIAGNOSIS — E1122 Type 2 diabetes mellitus with diabetic chronic kidney disease: Secondary | ICD-10-CM | POA: Insufficient documentation

## 2015-07-14 DIAGNOSIS — E669 Obesity, unspecified: Secondary | ICD-10-CM | POA: Insufficient documentation

## 2015-07-14 DIAGNOSIS — S8011XA Contusion of right lower leg, initial encounter: Secondary | ICD-10-CM | POA: Insufficient documentation

## 2015-07-14 DIAGNOSIS — D649 Anemia, unspecified: Secondary | ICD-10-CM | POA: Insufficient documentation

## 2015-07-14 DIAGNOSIS — I130 Hypertensive heart and chronic kidney disease with heart failure and stage 1 through stage 4 chronic kidney disease, or unspecified chronic kidney disease: Secondary | ICD-10-CM | POA: Insufficient documentation

## 2015-07-14 DIAGNOSIS — I2510 Atherosclerotic heart disease of native coronary artery without angina pectoris: Secondary | ICD-10-CM | POA: Diagnosis not present

## 2015-07-14 DIAGNOSIS — N182 Chronic kidney disease, stage 2 (mild): Secondary | ICD-10-CM | POA: Diagnosis not present

## 2015-07-14 DIAGNOSIS — I5032 Chronic diastolic (congestive) heart failure: Secondary | ICD-10-CM | POA: Insufficient documentation

## 2015-07-14 DIAGNOSIS — Z950 Presence of cardiac pacemaker: Secondary | ICD-10-CM | POA: Diagnosis not present

## 2015-07-14 LAB — CBC WITH DIFFERENTIAL/PLATELET
Basophils Absolute: 0 10*3/uL (ref 0.0–0.1)
Basophils Relative: 1 %
EOS ABS: 0.1 10*3/uL (ref 0.0–0.7)
EOS PCT: 2 %
HCT: 28 % — ABNORMAL LOW (ref 36.0–46.0)
HEMOGLOBIN: 8.9 g/dL — AB (ref 12.0–15.0)
LYMPHS ABS: 1.2 10*3/uL (ref 0.7–4.0)
Lymphocytes Relative: 22 %
MCH: 28.9 pg (ref 26.0–34.0)
MCHC: 31.8 g/dL (ref 30.0–36.0)
MCV: 90.9 fL (ref 78.0–100.0)
MONOS PCT: 8 %
Monocytes Absolute: 0.4 10*3/uL (ref 0.1–1.0)
NEUTROS PCT: 67 %
Neutro Abs: 3.5 10*3/uL (ref 1.7–7.7)
Platelets: 162 10*3/uL (ref 150–400)
RBC: 3.08 MIL/uL — ABNORMAL LOW (ref 3.87–5.11)
RDW: 13.7 % (ref 11.5–15.5)
WBC: 5.3 10*3/uL (ref 4.0–10.5)

## 2015-07-14 LAB — COMPREHENSIVE METABOLIC PANEL
ALK PHOS: 61 U/L (ref 38–126)
ALT: 17 U/L (ref 14–54)
AST: 31 U/L (ref 15–41)
Albumin: 3.1 g/dL — ABNORMAL LOW (ref 3.5–5.0)
Anion gap: 7 (ref 5–15)
BUN: 23 mg/dL — AB (ref 6–20)
CALCIUM: 9.4 mg/dL (ref 8.9–10.3)
CO2: 33 mmol/L — AB (ref 22–32)
CREATININE: 0.83 mg/dL (ref 0.44–1.00)
Chloride: 102 mmol/L (ref 101–111)
GFR calc non Af Amer: >60 mL/min (ref >60)
GLUCOSE: 112 mg/dL — AB (ref 65–99)
Potassium: 3.9 mmol/L (ref 3.5–5.1)
SODIUM: 142 mmol/L (ref 135–145)
Total Bilirubin: 1 mg/dL (ref 0.3–1.2)
Total Protein: 6.5 g/dL (ref 6.5–8.1)

## 2015-07-14 LAB — POC OCCULT BLOOD, ED: Fecal Occult Bld: POSITIVE — AB

## 2015-07-14 LAB — PROTIME-INR
INR: 3.05 — ABNORMAL HIGH (ref 0.00–1.49)
Prothrombin Time: 31 seconds — ABNORMAL HIGH (ref 11.6–15.2)

## 2015-07-14 LAB — I-STAT TROPONIN, ED: TROPONIN I, POC: 0.03 ng/mL (ref 0.00–0.08)

## 2015-07-14 LAB — BRAIN NATRIURETIC PEPTIDE: B Natriuretic Peptide: 139 pg/mL — ABNORMAL HIGH (ref 0.0–100.0)

## 2015-07-14 LAB — APTT: APTT: 43 s — AB (ref 24–37)

## 2015-07-14 MED ORDER — SODIUM CHLORIDE 0.9 % IV BOLUS (SEPSIS)
500.0000 mL | Freq: Once | INTRAVENOUS | Status: AC
  Administered 2015-07-14: 500 mL via INTRAVENOUS

## 2015-07-14 NOTE — Discharge Instructions (Signed)
Continue using compression stocking and elevate leg.  Recheck with her doctor next week unless any signs of bleeding.  Return if any signs of bleeding.

## 2015-07-14 NOTE — ED Notes (Signed)
Pt taken to XR.  

## 2015-07-14 NOTE — ED Notes (Signed)
Pt sent from Dr Margo Commonapper for evaluation of low Hgb.  States she was here on 3/18 and today had fingerstick hgb and it was much lower.  States bleeding is from her right leg.

## 2015-07-14 NOTE — ED Notes (Signed)
Pt made aware to return if symptoms worsen or if any life threatening symptoms occur.   

## 2015-07-14 NOTE — ED Provider Notes (Signed)
CSN: [id number]     Arrival date & time 07/14/15  1306 History   First MD Initiated Contact with Patient 07/14/15 1319     Chief Complaint  Patient presents with  . Abnormal Lab     (Consider location/radiation/quality/duration/timing/severity/associated sxs/prior Treatment) HPI 82 year old female on Xarelto presents today with reports that she is anemic. She was seen here 2 days ago with bruising of her right lower extremity. There is no known injury. History is evident by the granddaughter. Granddaughter states that the bruising is improving. They were told that she needed to follow-up with her primary care doctor. She reports that 2 days ago her hemoglobin was 9 and today in her doctor's office it was sudden. They have not noted any other bleeding. She has been having normal bowel movements. She has been on Xarelto since her last hospitalization.  They report there is been no hemoptysis, hematemesis, dark tarry stools, bright red blood per rectum, or hematuria Past Medical History  Diagnosis Date  . Chronic diastolic CHF (congestive heart failure) (HCC)     LVEF 60-65%, grade 2 diastolic dysfunction, PASP  . Type 2 diabetes mellitus (HCC)   . Essential hypertension, benign   . Osteoarthritis   . Obesity   . CKD (chronic kidney disease) stage 2, GFR 60-89 ml/min   . Coronary atherosclerosis of native coronary artery February 05, 2009    NSTEMI s/p DES prox LAD  . Symptomatic bradycardia     12/2011 s/p MDT Sensia DC PPM, [id number] H  . Syncope    Past Surgical History  Procedure Laterality Date  . Total abdominal hysterectomy    . Cataract extraction    . Pacemaker insertion    . Slt laser application Left 11/13/2012    Procedure: SLT LASER APPLICATION;  Surgeon: Susa Simmonds, MD;  Location: [facility];  Service: Ophthalmology;  Laterality: Left;  . Permanent pacemaker insertion Left 01/12/2012    Procedure: PERMANENT PACEMAKER INSERTION;  Surgeon: Marinus Maw, MD;   Location: [facility] CATH LAB;  Service: Cardiovascular;  Laterality: Left;   Family History  Problem Relation Age of Onset  . Diabetes    . Hypertension    . Coronary artery disease     Social History  Substance Use Topics  . Smoking status: Never Smoker   . Smokeless tobacco: Former Neurosurgeon    Types: Snuff    Quit date: 04/26/1990  . Alcohol Use: No   OB History    No data available     Review of Systems  All other systems reviewed and are negative.     Allergies  Review of patient's allergies indicates no known allergies.  Home Medications   Prior to Admission medications   Medication Sig Start Date End Date Taking? Authorizing Provider  amLODipine (NORVASC) 5 MG tablet Take 1 tablet by mouth daily. 07/11/15   Historical Provider, MD  atorvastatin (LIPITOR) 40 MG tablet Take 1 tablet (40 mg total) by mouth at bedtime. 09/14/11   June Leap, MD  benzonatate (TESSALON) 100 MG capsule Take 1 capsule (100 mg total) by mouth every 8 (eight) hours. Patient not taking: Reported on 06/29/2015 05/02/15   [name], MD  carvedilol (COREG) 12.5 MG tablet TAKE 1/2 OF A TABLET BY MOUTH TWICE DAILY WITH A MEAL 07/04/15   Erick Blinks, MD  cephALEXin (KEFLEX) 250 MG capsule Take 1 capsule (250 mg total) by mouth 4 (four) times daily - after meals and at bedtime. Patient not taking:  Reported on 07/12/2015 07/04/15   Erick BlinksJehanzeb Memon, MD  furosemide (LASIX) 40 MG tablet Take 1 tablet (40 mg total) by mouth 2 (two) times daily. Take one extra tablet daily prn for excess fluid 07/04/15   Erick BlinksJehanzeb Memon, MD  isosorbide mononitrate (IMDUR) 30 MG 24 hr tablet Take 1 tablet (30 mg total) by mouth daily. In the evening 03/10/15   Jonelle SidleSamuel G McDowell, MD  losartan (COZAAR) 50 MG tablet Take 50 mg by mouth every morning.     Historical Provider, MD  metFORMIN (GLUCOPHAGE) 500 MG tablet Take 500 mg by mouth 2 (two) times daily with a meal.    Historical Provider, MD  Multiple Vitamin (MULTIVITAMIN) tablet Take 1  tablet by mouth daily.      Historical Provider, MD  nitroGLYCERIN (NITROSTAT) 0.4 MG SL tablet Place 0.4 mg under the tongue every 5 (five) minutes x 3 doses as needed for chest pain (MAX 3 TABLETS).     Historical Provider, MD  oxybutynin (DITROPAN-XL) 5 MG 24 hr tablet Take 5 mg by mouth daily. 06/11/15   Historical Provider, MD  potassium chloride SA (K-DUR,KLOR-CON) 20 MEQ tablet Take 1 tablet (20 mEq total) by mouth daily. 07/04/15   Erick BlinksJehanzeb Memon, MD  tamsulosin (FLOMAX) 0.4 MG CAPS capsule Take 1 capsule (0.4 mg total) by mouth daily. 07/04/15   Erick BlinksJehanzeb Memon, MD  traMADol (ULTRAM) 50 MG tablet Take 50 mg by mouth every 4 (four) hours as needed for moderate pain.     Historical Provider, MD  XARELTO 15 MG TABS tablet Take 1 tablet by mouth daily. 07/11/15   Historical Provider, MD   BP 104/47 mmHg  Pulse 61  Temp(Src) 98.1 F (36.7 C) (Oral)  Resp 18  Ht 5\' 2"  (1.575 m)  Wt 90.266 kg  BMI 36.39 kg/m2  SpO2 100% Physical Exam  Constitutional: She is oriented to person, place, and time. She appears well-developed and well-nourished.  Elderly chronically ill-appearing female  HENT:  Head: Normocephalic and atraumatic.  Eyes: EOM are normal. Pupils are equal, round, and reactive to light.  Conjunctiva are slightly pale bilaterally  Neck: Normal range of motion. Neck supple.  Cardiovascular: Normal rate and regular rhythm.   Pulmonary/Chest: Effort normal. No respiratory distress.  Diffuse rhonchi noted  Abdominal: Soft. Bowel sounds are normal.  Musculoskeletal: Normal range of motion. She exhibits no edema or tenderness.  Ecchymosis right lower extremity from knee down to the ankle  Neurological: She is alert and oriented to person, place, and time.  Skin: Skin is warm and dry.  Nursing note and vitals reviewed.   ED Course  Procedures (including critical care time) Labs Review Labs Reviewed  POC OCCULT BLOOD, ED - Abnormal; Notable for the following:    Fecal Occult Bld  POSITIVE (*)    All other components within normal limits  CBC WITH DIFFERENTIAL/PLATELET  APTT  PROTIME-INR  COMPREHENSIVE METABOLIC PANEL  BRAIN NATRIURETIC PEPTIDE  I-STAT TROPOININ, ED   I did occult blood and witnessed results- trace stool obtained and it was yellow, test trace positive.  This  Imaging Review Dg Chest 2 View  07/12/2015  CLINICAL DATA:  Shortness of Breath EXAM: CHEST  2 VIEW COMPARISON:  07/02/2015 FINDINGS: Cardiac shadow remains enlarged. A pacing device is again seen and stable. The lungs are well aerated bilaterally without focal infiltrate or sizable effusion. No acute bony abnormality is seen. IMPRESSION: No active cardiopulmonary disease. Electronically Signed   By: Alcide CleverMark  Lukens M.D.   On:  07/12/2015 14:31   Dg Tibia/fibula Right  07/12/2015  CLINICAL DATA:  Right leg pain and bruising, no known injury, initial encounter EXAM: RIGHT TIBIA AND FIBULA - 2 VIEW COMPARISON:  None. FINDINGS: Degenerative changes of the right knee joint are noted. Some generalized soft tissue swelling is noted about the ankle. No acute fracture or dislocation is seen. No other soft tissue abnormality is noted. IMPRESSION: Degenerative changes in the knee joint. Mild soft tissue swelling is noted without acute bony abnormality. Electronically Signed   By: Alcide Clever M.D.   On: 07/12/2015 14:38   Dg Knee Complete 4 Views Right  07/12/2015  CLINICAL DATA:  Right leg bruising, initial encounter, no known injury EXAM: RIGHT KNEE - COMPLETE 4+ VIEW COMPARISON:  None. FINDINGS: Degenerative changes are noted in all 3 joint compartments. A small joint effusion is noted. No acute fracture or dislocation is seen. No other soft tissue abnormality is noted. IMPRESSION: Small joint effusion.  No acute fracture noted. Electronically Signed   By: Alcide Clever M.D.   On: 07/12/2015 14:37   Dg Femur, Min 2 Views Right  07/12/2015  CLINICAL DATA:  Right leg pain and bruising, no known injury, initial  encounter EXAM: RIGHT FEMUR 2 VIEWS COMPARISON:  None. FINDINGS: Degenerative changes of the hip joint and knee joint are seen. No acute fracture or dislocation is noted. Small knee joint effusion is noted. No other focal abnormality is seen. IMPRESSION: Small joint effusion.  No acute fracture or dislocation is noted. Electronically Signed   By: Alcide Clever M.D.   On: 07/12/2015 14:36   I have personally reviewed and evaluated these images and lab results as part of my medical decision-making.   EKG Interpretation   Date/Time:  Monday July 14 2015 14:02:18 EDT Ventricular Rate:  60 PR Interval:  75 QRS Duration: 120 QT Interval:  443 QTC Calculation: 443 R Axis:   -32 Text Interpretation:  Atrial-paced rhythm Incomplete left bundle branch  block No significant change since last tracing Confirmed by Daelyn Mozer MD,  Duwayne Heck ([id number]) on 07/14/2015 2:18:52 PM      MDM   Final diagnoses:  Contusion, lower leg, right, subsequent encounter  Anticoagulant long-term use  Anemia, unspecified anemia type        Margarita Grizzle, MD 07/14/15 1308

## 2015-08-06 ENCOUNTER — Other Ambulatory Visit: Payer: Self-pay | Admitting: Internal Medicine

## 2015-10-27 ENCOUNTER — Encounter: Payer: Self-pay | Admitting: Cardiology

## 2015-10-27 ENCOUNTER — Encounter: Payer: Medicare Other | Admitting: Cardiology

## 2015-10-27 NOTE — Progress Notes (Signed)
No show  This encounter was created in error - please disregard.

## 2015-11-04 ENCOUNTER — Other Ambulatory Visit: Payer: Self-pay | Admitting: Cardiology

## 2016-01-05 ENCOUNTER — Other Ambulatory Visit: Payer: Self-pay | Admitting: Cardiology

## 2016-03-03 ENCOUNTER — Encounter: Payer: Self-pay | Admitting: Cardiology

## 2016-03-03 ENCOUNTER — Other Ambulatory Visit: Payer: Self-pay | Admitting: Cardiology

## 2016-03-03 NOTE — Progress Notes (Signed)
Cardiology Office Note  Date: 03/04/2016   ID: Brianna Chandler, DOB 03/25/1924, MRN [id number]  PCP: Louie BostonAPPER,DAVID B, MD  Primary Cardiologist: Nona DellSamuel McDowell, MD   Chief Complaint  Patient presents with  . Coronary Artery Disease  . PAF    History of Present Illness: Brianna Chandler is a 82 y.o. female last seen in December 2016. She is here today with her son for a follow-up visit. Fortunately, she continues to do fairly well from a cardiac perspective. She does not report any angina symptoms with limited functional capacity. She reports no palpitations or syncope.  She follows with Dr. Ladona Ridgelaylor in the device clinic, Medtronic pacemaker in place.  I reviewed her medications which are outlined below. Cardiac regimen includes renally dosed Xarelto, Norvasc, Lipitor, Lasix, Imdur, Cozaar, and as needed nitroglycerin. She follows with Dr. Margo Commonapper regularly for routine lab work.  Echocardiogram from earlier in the year showed LVEF 60-65% range.  Past Medical History:  Diagnosis Date  . Chronic diastolic CHF (congestive heart failure) (HCC)    LVEF 60-65%, grade 2 diastolic dysfunction, PASP 54mmHg  . CKD (chronic kidney disease) stage 2, GFR 60-89 ml/min   . Coronary atherosclerosis of native coronary artery February 05, 2009   NSTEMI s/p DES prox LAD  . Essential hypertension, benign   . Obesity   . Osteoarthritis   . PAF (paroxysmal atrial fibrillation) (HCC)   . Symptomatic bradycardia    12/2011 s/p MDT Sensia DC PPM, [id number] H  . Syncope   . Type 2 diabetes mellitus (HCC)     Past Surgical History:  Procedure Laterality Date  . CATARACT EXTRACTION    . PACEMAKER INSERTION    . PERMANENT PACEMAKER INSERTION Left 01/12/2012   Procedure: PERMANENT PACEMAKER INSERTION;  Surgeon: Marinus MawGregg W Taylor, MD;  [facility];  Service: Cardiovascular;  Laterality: Left;  . SLT LASER APPLICATION Left 11/13/2012   Procedure: SLT LASER APPLICATION;  Surgeon: Susa Simmondsarroll F Haines, MD;   Location: [facility] ORS;  Service: Ophthalmology;  Laterality: Left;  . TOTAL ABDOMINAL HYSTERECTOMY      Current Outpatient Prescriptions  Medication Sig Dispense Refill  . amLODipine (NORVASC) 5 MG tablet Take 1 tablet by mouth daily.    Marland Kitchen. atorvastatin (LIPITOR) 40 MG tablet Take 1 tablet (40 mg total) by mouth at bedtime. 30 tablet 3  . carvedilol (COREG) 12.5 MG tablet TAKE 1/2 TABLET BY MOUTH TWICE DAILY WITH A MEAL 30 tablet 6  . furosemide (LASIX) 40 MG tablet Take 1 tablet (40 mg total) by mouth 2 (two) times daily. Take one extra tablet daily prn for excess fluid 60 tablet 1  . isosorbide mononitrate (IMDUR) 30 MG 24 hr tablet TAKE 1 TABLET BY MOUTH EVERY EVENING 90 tablet 3  . losartan (COZAAR) 50 MG tablet Take 50 mg by mouth every morning.     . metFORMIN (GLUCOPHAGE) 500 MG tablet Take 500 mg by mouth 2 (two) times daily with a meal.    . Multiple Vitamin (MULTIVITAMIN) tablet Take 1 tablet by mouth daily.      . nitroGLYCERIN (NITROSTAT) 0.4 MG SL tablet Place 0.4 mg under the tongue every 5 (five) minutes x 3 doses as needed for chest pain (MAX 3 TABLETS).     Marland Kitchen. oxybutynin (DITROPAN-XL) 5 MG 24 hr tablet Take 5 mg by mouth daily.    . traMADol (ULTRAM) 50 MG tablet Take 50-100 mg by mouth 2 (two) times daily as needed for moderate pain.     .Marland Kitchen  XARELTO 15 MG TABS tablet Take 1 tablet by mouth daily.     No current facility-administered medications for this visit.    Allergies:  Patient has no known allergies.   Social History: The patient  reports that she has never smoked. She quit smokeless tobacco use about 25 years ago. Her smokeless tobacco use included Snuff. She reports that she does not drink alcohol or use drugs.   ROS:  Please see the history of present illness. Otherwise, complete review of systems is positive for decreased hearing.  All other systems are reviewed and negative.   Physical Exam: VS:  BP (!) 148/67   Pulse 65   Ht 5\' 2"  (1.575 m)   Wt 216 lb 6.4 oz (98.2  kg)   SpO2 100%   BMI 39.58 kg/m , BMI Body mass index is 39.58 kg/m.  Wt Readings from Last 3 Encounters:  03/04/16 216 lb 6.4 oz (98.2 kg)  07/14/15 199 lb (90.3 kg)  07/12/15 201 lb (91.2 kg)    Overweight woman, appears comfortable.In a wheelchair today. HEENT: Conjunctiva and lids normal, oropharynx clear.  Neck: Supple, no elevated JVP, no thyromegaly.  Lungs: Clear to auscultation, nonlabored breathing at rest.  Cardiac: Regular rate and rhythm, no S3, soft systolic murmur.  Abdomen: Soft, nontender, bowel sounds present.  Extremities: Trace edema, distal pulses 2+.  Skin: Warm and dry. Musculoskeletal: No kyphosis. Neuropsychiatric: Alert and oriented 3, affect appropriate, decreased hearing.  ECG: I personally reviewed the tracing from 07/14/2015 which showed an atrial paced rhythm with IVCD.  Recent Labwork: 07/14/2015: ALT 17; AST 31; B Natriuretic Peptide 139.0; BUN 23; Creatinine, Ser 0.83; Hemoglobin 8.9; Platelets 162; Potassium 3.9; Sodium 142   Other Studies Reviewed Today:  Lexiscan Cardiolite January 2014: Small to medium sized inferior defect consistent with scar and mild peri-infarct ischemia, LVEF 50%.  Echocardiogram 07/01/2015: Study Conclusions  - Left ventricle: The cavity size was normal. Wall thickness was   increased in a pattern of moderate LVH. Systolic function was   normal. The estimated ejection fraction was in the range of 60%   to 65%. Wall motion was normal; there were no regional wall   motion abnormalities. Features are consistent with a pseudonormal   left ventricular filling pattern, with concomitant abnormal   relaxation and increased filling pressure (grade 2 diastolic   dysfunction). - Aortic valve: Mildly calcified annulus. Trileaflet; mildly   thickened leaflets. Valve area (VTI): 1.68 cm^2. Valve area   (Vmax): 1.56 cm^2. - Left atrium: The atrium was severely dilated. - Right atrium: The atrium was mildly dilated. -  Atrial septum: No defect or patent foramen ovale was identified. - Pulmonary arteries: Systolic pressure was moderately increased.   PA peak pressure: 44 mm Hg (S). - Technically adequate study.  Assessment and Plan:  1. Chronic diastolic heart failure, symptomatically stable. She did is on diuretics. Weight is up compared to prior assessment, discussed sodium restriction. Echocardiogram obtained earlier this year.  2. Symptomatic bradycardia status post Medtronic pacemaker placement. She follows with Dr. Ladona Ridgel.  3. Paroxysmal atrial fibrillation, no palpitations or chest pain. She continues on renally dosed Xarelto as well as Coreg.  4. History of DES to the LAD as outlined above. No active angina symptoms. Would continue with conservative medical management. She is on beta blocker and Lipitor, no aspirin with concurrent use of Xarelto.  Current medicines were reviewed with the patient today.  Disposition: Follow-up in 6 months.  Signed, Jonelle Sidle, MD,  [facility] 03/04/2016 2:12 PM    [facility] [place] [address], [place], [place] [address] Phone: [phone]; Fax: [phone]

## 2016-03-04 ENCOUNTER — Encounter: Payer: Self-pay | Admitting: Cardiology

## 2016-03-04 ENCOUNTER — Ambulatory Visit (INDEPENDENT_AMBULATORY_CARE_PROVIDER_SITE_OTHER): Payer: Medicare Other | Admitting: Cardiology

## 2016-03-04 VITALS — BP 148/67 | HR 65 | Ht 62.0 in | Wt 216.4 lb

## 2016-03-04 DIAGNOSIS — I5032 Chronic diastolic (congestive) heart failure: Secondary | ICD-10-CM | POA: Diagnosis not present

## 2016-03-04 DIAGNOSIS — I480 Paroxysmal atrial fibrillation: Secondary | ICD-10-CM | POA: Diagnosis not present

## 2016-03-04 DIAGNOSIS — Z950 Presence of cardiac pacemaker: Secondary | ICD-10-CM

## 2016-03-04 DIAGNOSIS — I2510 Atherosclerotic heart disease of native coronary artery without angina pectoris: Secondary | ICD-10-CM | POA: Diagnosis not present

## 2016-03-04 DIAGNOSIS — R001 Bradycardia, unspecified: Secondary | ICD-10-CM

## 2016-03-04 NOTE — Patient Instructions (Signed)

## 2016-03-08 ENCOUNTER — Encounter: Payer: Self-pay | Admitting: Internal Medicine

## 2016-03-08 ENCOUNTER — Encounter: Payer: Medicare Other | Admitting: Internal Medicine

## 2016-05-03 ENCOUNTER — Other Ambulatory Visit: Payer: Self-pay | Admitting: Internal Medicine

## 2016-06-01 ENCOUNTER — Other Ambulatory Visit: Payer: Self-pay | Admitting: Cardiology

## 2016-06-21 ENCOUNTER — Ambulatory Visit (INDEPENDENT_AMBULATORY_CARE_PROVIDER_SITE_OTHER): Payer: Medicare Other | Admitting: Internal Medicine

## 2016-06-21 ENCOUNTER — Encounter: Payer: Self-pay | Admitting: Internal Medicine

## 2016-06-21 VITALS — BP 140/76 | HR 84 | Ht 63.0 in | Wt 207.0 lb

## 2016-06-21 DIAGNOSIS — Z950 Presence of cardiac pacemaker: Secondary | ICD-10-CM | POA: Diagnosis not present

## 2016-06-21 DIAGNOSIS — I4891 Unspecified atrial fibrillation: Secondary | ICD-10-CM

## 2016-06-21 LAB — CUP PACEART INCLINIC DEVICE CHECK
Battery Remaining Longevity: 90 mo
Brady Statistic AS VP Percent: 0 %
Implantable Lead Implant Date: 20130918
Implantable Pulse Generator Implant Date: 20130918
Lead Channel Impedance Value: 378 Ohm
Lead Channel Pacing Threshold Amplitude: 1 V
Lead Channel Pacing Threshold Pulse Width: 0.4 ms
Lead Channel Sensing Intrinsic Amplitude: 0.5 mV
Lead Channel Setting Pacing Amplitude: 2 V
Lead Channel Setting Sensing Sensitivity: 4 mV
MDC IDC LEAD IMPLANT DT: 20130918
MDC IDC LEAD LOCATION: 753859
MDC IDC LEAD LOCATION: 753860
MDC IDC MSMT BATTERY IMPEDANCE: 395 Ohm
MDC IDC MSMT BATTERY VOLTAGE: 2.79 V
MDC IDC MSMT LEADCHNL RV IMPEDANCE VALUE: 575 Ohm
MDC IDC MSMT LEADCHNL RV SENSING INTR AMPL: 11.2 mV
MDC IDC SESS DTM: 20180226125929
MDC IDC SET LEADCHNL RV PACING AMPLITUDE: 2.5 V
MDC IDC SET LEADCHNL RV PACING PULSEWIDTH: 0.4 ms
MDC IDC STAT BRADY AP VP PERCENT: 0 %
MDC IDC STAT BRADY AP VS PERCENT: 89 %
MDC IDC STAT BRADY AS VS PERCENT: 11 %

## 2016-06-21 NOTE — Progress Notes (Signed)
HPI Mrs. Hutcherson returns today for followup. She is a very pleasant 83 year old woman with morbid obesity, coronary artery disease status post stenting in 2010, with symptomatic bradycardia, status post permanent pacemaker insertion. In the interim, she has been stable. She denies any falls or bleeding. No palpitations.  She has not been in the hospital. No recent falls. Appetite is good. No Known Allergies   Current Outpatient Prescriptions  Medication Sig Dispense Refill  . amLODipine (NORVASC) 5 MG tablet Take 1 tablet by mouth daily.    Marland Kitchen amLODipine (NORVASC) 5 MG tablet TAKE 1 TABLET BY MOUTH EVERY DAY 30 tablet 3  . atorvastatin (LIPITOR) 40 MG tablet Take 1 tablet (40 mg total) by mouth at bedtime. 30 tablet 3  . carvedilol (COREG) 12.5 MG tablet TAKE 1/2 TABLET BY MOUTH TWICE DAILY WITH A MEAL 30 tablet 6  . furosemide (LASIX) 40 MG tablet Take 1 tablet (40 mg total) by mouth 2 (two) times daily. Take one extra tablet daily prn for excess fluid 60 tablet 1  . isosorbide mononitrate (IMDUR) 30 MG 24 hr tablet TAKE 1 TABLET BY MOUTH EVERY EVENING 90 tablet 3  . losartan (COZAAR) 50 MG tablet Take 50 mg by mouth every morning.     . metFORMIN (GLUCOPHAGE) 500 MG tablet Take 500 mg by mouth 2 (two) times daily with a meal.    . Multiple Vitamin (MULTIVITAMIN) tablet Take 1 tablet by mouth daily.      . nitroGLYCERIN (NITROSTAT) 0.4 MG SL tablet Place 0.4 mg under the tongue every 5 (five) minutes x 3 doses as needed for chest pain (MAX 3 TABLETS).     Marland Kitchen oxybutynin (DITROPAN-XL) 5 MG 24 hr tablet Take 5 mg by mouth daily.    . traMADol (ULTRAM) 50 MG tablet Take 50-100 mg by mouth 2 (two) times daily as needed for moderate pain.     Marland Kitchen XARELTO 15 MG TABS tablet Take 1 tablet by mouth daily.     No current facility-administered medications for this visit.      Past Medical History:  Diagnosis Date  . Chronic diastolic CHF (congestive heart failure) (HCC)    LVEF 60-65%, grade 2 diastolic  dysfunction, PASP  . CKD (chronic kidney disease) stage 2, GFR 60-89 ml/min   . Coronary atherosclerosis of native coronary artery February 05, 2009   NSTEMI s/p DES prox LAD  . Essential hypertension, benign   . Obesity   . Osteoarthritis   . PAF (paroxysmal atrial fibrillation) (HCC)   . Symptomatic bradycardia    12/2011 s/p MDT Sensia DC PPM, [id number] H  . Syncope   . Type 2 diabetes mellitus (HCC)     ROS:   All systems reviewed and negative except as noted in the HPI.   Past Surgical History:  Procedure Laterality Date  . CATARACT EXTRACTION    . PACEMAKER INSERTION    . PERMANENT PACEMAKER INSERTION Left 01/12/2012   Procedure: PERMANENT PACEMAKER INSERTION;  Surgeon: Marinus Maw, MD;  Location[facility] C[facility] LAB;  Service: Cardiovascular;  Laterality: Left;  . SLT LASER APPLICATION Left 11/13/2012   Procedure: SLT LASER APPLICATION;  Surgeon: Susa Simmonds, MD;  Location: [facility] ORS;  Service: Ophthalmology;  Laterality: Left;  . TOTAL ABDOMINAL HYSTERECTOMY       Family History  Problem Relation Age of Onset  . Diabetes    . Hypertension    . Coronary artery disease       Social History  Social History  . Marital status: Widowed    Spouse name: N/A  . Number of children: N/A  . Years of education: N/A   Occupational History  . Retired    Social History Main Topics  . Smoking status: Never Smoker  . Smokeless tobacco: Former NeurosurgeonUser    Types: Snuff    Quit date: 04/26/1990  . Alcohol use No  . Drug use: No  . Sexual activity: Not on file   Other Topics Concern  . Not on file   Social History Narrative  . No narrative on file     BP 140/76   Pulse 84   Ht 5\' 3"  (1.6 m)   Wt 207 lb (93.9 kg)   SpO2 94%   BMI 36.67 kg/m   Physical Exam:  Obese, elderly appearing 83 year old woman, NAD HEENT: Unremarkable,  Neck:  7 cm JVD, no thyromegally Back:  No CVA tenderness Lungs:  Clear with no wheezes, rales, or rhonchi. HEART:  Regular rate  rhythm, no murmurs, no rubs, no clicks Abd:  soft, obese, positive bowel sounds, no organomegally, no rebound, no guarding Ext:  2 plus pulses, no edema, no cyanosis, no clubbing Skin:  No rashes no nodules Neuro:  CN II through XII intact, motor grossly intact  DEVICE  Normal device function.  See PaceArt for details.   Assess/Plan: 1. Persistent atrial fib - her ventricular rate is controlled. No change in her meds. 2. PPM - we reprogrammed her PPM to DDIR today in light of her anticipated chronic atrial fib. Will recheck in several months. 3. HTN - her blood pressure is relatively well controlled. I would not be inclined to try and aggressively drive it down.  4. Chronic diastolic heart failure - she is well compensated. Will follow.  Leonia ReevesGregg Etheridge Geil,M.D.

## 2016-06-21 NOTE — Patient Instructions (Signed)
Your physician wants you to follow-up in: 1 Year with Dr. Taylor. You will receive a reminder letter in the mail two months in advance. If you don't receive a letter, please call our office to schedule the follow-up appointment.  Your physician recommends that you schedule a follow-up appointment in: 6 Months with the Device Clinic  Your physician recommends that you continue on your current medications as directed. Please refer to the Current Medication list given to you today.  If you need a refill on your cardiac medications before your next appointment, please call your pharmacy.  Thank you for choosing [facility]!    

## 2016-08-02 ENCOUNTER — Other Ambulatory Visit: Payer: Self-pay | Admitting: Cardiology

## 2016-08-30 ENCOUNTER — Encounter: Payer: Self-pay | Admitting: *Deleted

## 2016-08-31 ENCOUNTER — Encounter: Payer: Self-pay | Admitting: Cardiology

## 2016-08-31 ENCOUNTER — Ambulatory Visit (INDEPENDENT_AMBULATORY_CARE_PROVIDER_SITE_OTHER): Payer: Medicare Other | Admitting: Cardiology

## 2016-08-31 VITALS — BP 149/70 | HR 71 | Ht 62.0 in | Wt 203.4 lb

## 2016-08-31 DIAGNOSIS — I480 Paroxysmal atrial fibrillation: Secondary | ICD-10-CM

## 2016-08-31 DIAGNOSIS — I2510 Atherosclerotic heart disease of native coronary artery without angina pectoris: Secondary | ICD-10-CM | POA: Diagnosis not present

## 2016-08-31 DIAGNOSIS — Z950 Presence of cardiac pacemaker: Secondary | ICD-10-CM

## 2016-08-31 DIAGNOSIS — I5032 Chronic diastolic (congestive) heart failure: Secondary | ICD-10-CM | POA: Diagnosis not present

## 2016-08-31 NOTE — Progress Notes (Signed)
Cardiology Office Note  Date: 08/31/2016   ID: Brianna FullerJean C Hollibaugh, DOB 10/25/1923, MRN [id number]  PCP: Louie Bostonapper, David B., MD  Primary Cardiologist: Nona DellSamuel McDowell, MD   Chief Complaint  Patient presents with  . PAF    History of Present Illness: Brianna Chandler is a 83 y.o. female last seen in November 2017. She presents for a routine follow-up visit. States that she is fairly sedentary, stays home most of the time and watches television. She uses a walker and also has supplemental oxygen.  She continues to follow in the device clinic with Dr. Ladona Ridgelaylor, Medtronic pacemaker in place. I personally reviewed her ECG today which shows probable sinus rhythm with indistinct P waves, poor R wave progression.  Current medications include Norvasc, Lipitor, Coreg, Lasix, Imdur, Cozaar, and renally adjusted Xarelto. She does not report any falls or bleeding problems. Her weight is stable without suggestion of increasing fluid overload.   Past Medical History:  Diagnosis Date  . Chronic diastolic CHF (congestive heart failure) (HCC)    LVEF 60-65%, grade 2 diastolic dysfunction, PASP 54mmHg  . CKD (chronic kidney disease) stage 2, GFR 60-89 ml/min   . Coronary atherosclerosis of native coronary artery February 05, 2009   NSTEMI s/p DES prox LAD  . Essential hypertension, benign   . Obesity   . Osteoarthritis   . PAF (paroxysmal atrial fibrillation) (HCC)   . Symptomatic bradycardia    12/2011 s/p MDT Sensia DC PPM, [id number] H  . Syncope   . Type 2 diabetes mellitus (HCC)     Past Surgical History:  Procedure Laterality Date  . CATARACT EXTRACTION    . PACEMAKER INSERTION    . PERMANENT PACEMAKER INSERTION Left 01/12/2012   Procedure: PERMANENT PACEMAKER INSERTION;  Surgeon: Marinus MawGregg W Taylor, MD;  [facility];  Service: Cardiovascular;  Laterality: Left;  . SLT LASER APPLICATION Left 11/13/2012   Procedure: SLT LASER APPLICATION;  Surgeon: Susa Simmondsarroll F Haines, MD;  Location: [facility] ORS;   Service: Ophthalmology;  Laterality: Left;  . TOTAL ABDOMINAL HYSTERECTOMY      Current Outpatient Prescriptions  Medication Sig Dispense Refill  . amLODipine (NORVASC) 5 MG tablet Take 1 tablet by mouth daily.    Marland Kitchen. atorvastatin (LIPITOR) 40 MG tablet Take 1 tablet (40 mg total) by mouth at bedtime. 30 tablet 3  . carvedilol (COREG) 12.5 MG tablet TAKE 1/2 TABLET BY MOUTH TWICE DAILY WITH A MEAL 30 tablet 6  . furosemide (LASIX) 40 MG tablet Take 1 tablet (40 mg total) by mouth 2 (two) times daily. Take one extra tablet daily prn for excess fluid 60 tablet 1  . isosorbide mononitrate (IMDUR) 30 MG 24 hr tablet TAKE 1 TABLET BY MOUTH EVERY EVENING 90 tablet 3  . losartan (COZAAR) 50 MG tablet Take 50 mg by mouth every morning.     . metFORMIN (GLUCOPHAGE) 500 MG tablet Take 500 mg by mouth 2 (two) times daily with a meal.    . Multiple Vitamin (MULTIVITAMIN) tablet Take 1 tablet by mouth daily.      . nitroGLYCERIN (NITROSTAT) 0.4 MG SL tablet Place 0.4 mg under the tongue every 5 (five) minutes x 3 doses as needed for chest pain (MAX 3 TABLETS).     Marland Kitchen. oxybutynin (DITROPAN-XL) 5 MG 24 hr tablet Take 5 mg by mouth daily.    . traMADol (ULTRAM) 50 MG tablet Take 50-100 mg by mouth 2 (two) times daily as needed for moderate pain.     .Marland Kitchen  XARELTO 15 MG TABS tablet Take 1 tablet by mouth daily.     No current facility-administered medications for this visit.    Allergies:  Patient has no known allergies.   Social History: The patient  reports that she has never smoked. She quit smokeless tobacco use about 26 years ago. Her smokeless tobacco use included Snuff. She reports that she does not drink alcohol or use drugs.   ROS:  Please see the history of present illness. Otherwise, complete review of systems is positive for diminished hearing.  All other systems are reviewed and negative.   Physical Exam: VS:  BP (!) 149/70   Pulse 71   Ht 5\' 2"  (1.575 m)   Wt 203 lb 6.4 oz (92.3 kg)   SpO2 100%  Comment: 2.5 L/min  BMI 37.20 kg/m , BMI Body mass index is 37.2 kg/m.  Wt Readings from Last 3 Encounters:  08/31/16 203 lb 6.4 oz (92.3 kg)  06/21/16 207 lb (93.9 kg)  03/04/16 216 lb 6.4 oz (98.2 kg)    Overweight elderly woman, appears comfortable. HEENT: Conjunctiva and lids normal, oropharynx clear.  Neck: Supple, no elevated JVP, no thyromegaly.  Lungs: Clear to auscultation, nonlabored breathing at rest.  Cardiac: Regular rate and rhythm, no S3, soft systolic murmur.  Abdomen: Soft, nontender, bowel sounds present.  Extremities: Trace leg edema, distal pulses 2+.  Skin: Warm and dry. Musculoskeletal: No kyphosis. Neuropsychiatric: Alert and oriented 3, affect appropriate, decreased hearing.  ECG: I personally reviewed the tracing from 07/14/2015 which showed an atrial paced rhythm with IVCD.  Recent Labwork:  07/14/2015: ALT 17; AST 31; B Natriuretic Peptide 139.0; BUN 23; Creatinine, Ser 0.83; Hemoglobin 8.9; Platelets 162; Potassium 3.9; Sodium 142   Other Studies Reviewed Today:  Echocardiogram 07/01/2015: Study Conclusions  - Left ventricle: The cavity size was normal. Wall thickness was increased in a pattern of moderate LVH. Systolic function was normal. The estimated ejection fraction was in the range of 60% to 65%. Wall motion was normal; there were no regional wall motion abnormalities. Features are consistent with a pseudonormal left ventricular filling pattern, with concomitant abnormal relaxation and increased filling pressure (grade 2 diastolic dysfunction). - Aortic valve: Mildly calcified annulus. Trileaflet; mildly thickened leaflets. Valve area (VTI): 1.68 cm^2. Valve area (Vmax): 1.56 cm^2. - Left atrium: The atrium was severely dilated. - Right atrium: The atrium was mildly dilated. - Atrial septum: No defect or patent foramen ovale was identified. - Pulmonary arteries: Systolic pressure was moderately increased. PA peak  pressure: 44 mm Hg (S). - Technically adequate study.  Assessment and Plan:  1. Paroxysmal atrial fibrillation, patient denies palpitations and continues on Coreg as well as renally adjusted Xarelto.  2. Medtronic pacemaker in place, continues to follow in the device clinic with Dr. Ladona Ridgel. Most recent interrogation was in February.  3. CAD status post DES to the LAD. She does not report any active angina symptoms and we continue with conservative follow-up on medical therapy.  4. History of diastolic heart failure, weight is down and she remains stable on current dose of Lasix.  Current medicines were reviewed with the patient today.   Orders Placed This Encounter  Procedures  . EKG 12-Lead    Disposition: Follow-up in 6 months.  Signed, Jonelle Sidle, MD, [facility] 08/31/2016 4:22 PM    [facility] at [facility] [address] [place], [place], [place] [address] Phone: [phone]; Fax: [phone]

## 2016-08-31 NOTE — Patient Instructions (Signed)

## 2016-09-26 ENCOUNTER — Other Ambulatory Visit: Payer: Self-pay | Admitting: Cardiology

## 2016-11-23 ENCOUNTER — Other Ambulatory Visit: Payer: Self-pay | Admitting: Cardiology

## 2016-12-23 ENCOUNTER — Ambulatory Visit (INDEPENDENT_AMBULATORY_CARE_PROVIDER_SITE_OTHER): Payer: Medicare Other | Admitting: *Deleted

## 2016-12-23 DIAGNOSIS — R001 Bradycardia, unspecified: Secondary | ICD-10-CM

## 2016-12-23 DIAGNOSIS — I480 Paroxysmal atrial fibrillation: Secondary | ICD-10-CM | POA: Diagnosis not present

## 2016-12-23 LAB — CUP PACEART INCLINIC DEVICE CHECK
Battery Remaining Longevity: 82 mo
Brady Statistic AS VP Percent: 0 %
Brady Statistic AS VS Percent: 16 %
Implantable Lead Implant Date: 20130918
Implantable Lead Location: 753859
Implantable Lead Location: 753860
Implantable Lead Model: 5076
Lead Channel Impedance Value: 373 Ohm
Lead Channel Pacing Threshold Amplitude: 0.75 V
Lead Channel Pacing Threshold Pulse Width: 0.4 ms
Lead Channel Sensing Intrinsic Amplitude: 1.4 mV
Lead Channel Sensing Intrinsic Amplitude: 15.67 mV
Lead Channel Setting Pacing Amplitude: 2 V
Lead Channel Setting Pacing Pulse Width: 0.4 ms
MDC IDC LEAD IMPLANT DT: 20130918
MDC IDC MSMT BATTERY IMPEDANCE: 495 Ohm
MDC IDC MSMT BATTERY VOLTAGE: 2.78 V
MDC IDC MSMT LEADCHNL RA PACING THRESHOLD AMPLITUDE: 0.75 V
MDC IDC MSMT LEADCHNL RA PACING THRESHOLD PULSEWIDTH: 0.4 ms
MDC IDC MSMT LEADCHNL RV IMPEDANCE VALUE: 586 Ohm
MDC IDC MSMT LEADCHNL RV PACING THRESHOLD AMPLITUDE: 0.75 V
MDC IDC MSMT LEADCHNL RV PACING THRESHOLD PULSEWIDTH: 0.4 ms
MDC IDC PG IMPLANT DT: 20130918
MDC IDC SESS DTM: 20180830123256
MDC IDC SET LEADCHNL RV PACING AMPLITUDE: 2.5 V
MDC IDC SET LEADCHNL RV SENSING SENSITIVITY: 5.6 mV
MDC IDC STAT BRADY AP VP PERCENT: 1 %
MDC IDC STAT BRADY AP VS PERCENT: 83 %

## 2016-12-23 NOTE — Progress Notes (Signed)
Pacemaker check in clinic. Normal device function. Thresholds, sensing, impedances consistent with previous measurements. Device programmed to maximize longevity. 24.1% AT/AF burden, max dur. 26hrs + xarelto. (3) high ventricular rates noted, (2) AT, (1) VT-NS x10bts. Device programmed at appropriate safety margins. Histogram distribution appropriate for patient activity level. Device programmed to optimize intrinsic conduction. Estimated longevity 7 years. Patient will follow up with GT/R in 6 months.

## 2017-01-23 ENCOUNTER — Other Ambulatory Visit: Payer: Self-pay | Admitting: Cardiology

## 2017-02-20 ENCOUNTER — Other Ambulatory Visit: Payer: Self-pay | Admitting: Cardiology

## 2017-03-09 NOTE — Progress Notes (Signed)
Cardiology Office Note  Date: 03/10/2017   ID: Brianna Chandler, DOB 10/23/1923, MRN [id number]  PCP: Louie Bostonapper, David B., MD  Primary Cardiologist: Nona DellSamuel Stina Gane, MD   Chief Complaint  Patient presents with  . PAF    History of Present Illness: Brianna Chandler is a 83 y.o. female last seen in May.  She presents today for a follow-up visit.  She does not report any chest pain or palpitations.  States that she has been compliant with her medications.  She continues to follow with Dr. Jackolyn Conferapper's office.  She continues to follow with Dr. Ladona Ridgelaylor in the device clinic, Medtronic pacemaker in place.  Last device check indicated 24% AT/AF burden.  Next interrogation will be in February.  We discussed follow-up lab work on Xarelto.  She states that she has not had any recent lab work.  Reports no bleeding problems.  Past Medical History:  Diagnosis Date  . Chronic diastolic CHF (congestive heart failure) (HCC)    LVEF 60-65%, grade 2 diastolic dysfunction, PASP 54mmHg  . CKD (chronic kidney disease) stage 2, GFR 60-89 ml/min   . Coronary atherosclerosis of native coronary artery February 05, 2009   NSTEMI s/p DES prox LAD  . Essential hypertension, benign   . Obesity   . Osteoarthritis   . PAF (paroxysmal atrial fibrillation) (HCC)   . Symptomatic bradycardia    12/2011 s/p MDT Sensia DC PPM, [id number] H  . Syncope   . Type 2 diabetes mellitus (HCC)     Past Surgical History:  Procedure Laterality Date  . CATARACT EXTRACTION    . PACEMAKER INSERTION    . PERMANENT PACEMAKER INSERTION Left 01/12/2012   Procedure: PERMANENT PACEMAKER INSERTION;  Surgeon: Marinus MawGregg W Taylor, MD;  [facility];  Service: Cardiovascular;  Laterality: Left;  . SLT LASER APPLICATION Left 11/13/2012   Procedure: SLT LASER APPLICATION;  Surgeon: Susa Simmondsarroll F Haines, MD;  Location: [facility] ORS;  Service: Ophthalmology;  Laterality: Left;  . TOTAL ABDOMINAL HYSTERECTOMY      Current Outpatient Medications    Medication Sig Dispense Refill  . amLODipine (NORVASC) 5 MG tablet TAKE ONE TABLET BY MOUTH EVERY MORNING 30 tablet 6  . atorvastatin (LIPITOR) 40 MG tablet Take 1 tablet (40 mg total) by mouth at bedtime. 30 tablet 3  . carvedilol (COREG) 12.5 MG tablet TAKE 1/2 TABLET BY MOUTH TWICE DAILY 30 tablet 5  . furosemide (LASIX) 40 MG tablet Take 1 tablet (40 mg total) by mouth 2 (two) times daily. Take one extra tablet daily prn for excess fluid 60 tablet 1  . isosorbide mononitrate (IMDUR) 30 MG 24 hr tablet TAKE ONE TABLET BY MOUTH EVERY EVENING 90 tablet 3  . losartan (COZAAR) 50 MG tablet Take 50 mg by mouth every morning.     . metFORMIN (GLUCOPHAGE) 500 MG tablet Take 500 mg by mouth 2 (two) times daily with a meal.    . Multiple Vitamin (MULTIVITAMIN) tablet Take 1 tablet by mouth daily.      . nitroGLYCERIN (NITROSTAT) 0.4 MG SL tablet Place 0.4 mg under the tongue every 5 (five) minutes x 3 doses as needed for chest pain (MAX 3 TABLETS).     Marland Kitchen. oxybutynin (DITROPAN-XL) 5 MG 24 hr tablet Take 5 mg by mouth daily.    . traMADol (ULTRAM) 50 MG tablet Take 50-100 mg by mouth 2 (two) times daily as needed for moderate pain.     Marland Kitchen. XARELTO 15 MG TABS tablet TAKE ONE  TABLET BY MOUTH EVERY MORNING 30 tablet 3   No current facility-administered medications for this visit.    Allergies:  Patient has no known allergies.   Social History: The patient  reports that  has never smoked. She quit smokeless tobacco use about 26 years ago. Her smokeless tobacco use included snuff. She reports that she does not drink alcohol or use drugs.   ROS:  Please see the history of present illness. Otherwise, complete review of systems is positive for hearing loss.  All other systems are reviewed and negative.   Physical Exam: VS:  BP 118/62   Pulse 66   Ht 5\' 2"  (1.575 m)   Wt 205 lb (93 kg)   SpO2 96%   BMI 37.49 kg/m , BMI Body mass index is 37.49 kg/m.  Wt Readings from Last 3 Encounters:  03/10/17 205  lb (93 kg)  08/31/16 203 lb 6.4 oz (92.3 kg)  06/21/16 207 lb (93.9 kg)    General: Overweight elderly woman in a wheelchair. HEENT: Conjunctiva and lids normal, oropharynx clear. Neck: Supple, no elevated JVP or carotid bruits, no thyromegaly. Lungs: Clear to auscultation, nonlabored breathing at rest. Cardiac: Regular rate and rhythm with ectopy, no S3, soft systolic murmur. Abdomen: Soft, nontender, bowel sounds present. Extremities: Mild ankle edema, distal pulses 2+. Skin: Warm and dry. Musculoskeletal: No kyphosis. Neuropsychiatric: Alert and oriented x3, affect grossly appropriate.  Decreased hearing.  ECG: I personally reviewed the tracing from 08/31/2016 which showed sinus rhythm with prolonged PR interval, IVCD.  Recent Labwork:  March 2017: Potassium 3.9, BUN 23, creatinine 0.83  Other Studies Reviewed Today:  Echocardiogram 07/01/2015: Study Conclusions  - Left ventricle: The cavity size was normal. Wall thickness was   increased in a pattern of moderate LVH. Systolic function was   normal. The estimated ejection fraction was in the range of 60%   to 65%. Wall motion was normal; there were no regional wall   motion abnormalities. Features are consistent with a pseudonormal   left ventricular filling pattern, with concomitant abnormal   relaxation and increased filling pressure (grade 2 diastolic   dysfunction). - Aortic valve: Mildly calcified annulus. Trileaflet; mildly   thickened leaflets. Valve area (VTI): 1.68 cm^2. Valve area   (Vmax): 1.56 cm^2. - Left atrium: The atrium was severely dilated. - Right atrium: The atrium was mildly dilated. - Atrial septum: No defect or patent foramen ovale was identified. - Pulmonary arteries: Systolic pressure was moderately increased.   PA peak pressure: 44 mm Hg (S). - Technically adequate study.  Assessment and Plan:  1.  Paroxysmal atrial fibrillation.  She continues on Coreg and Xarelto.  Reports no bleeding  problems.  Follow-up CBC and BMET.  2.  Medtronic pacemaker in place.  She will have follow-up with Dr. Ladona Ridgelaylor in February.  Last device interrogation indicated approximately 24% AT/AF episodes.  3.  CAD status post DES to the LAD.  We are following this conservatively on medical therapy.  She continues on statin.  4.  Chronic diastolic heart failure.  Weight is stable on current diuretic regimen.  Current medicines were reviewed with the patient today.   Orders Placed This Encounter  Procedures  . Basic Metabolic Panel (BMET)  . CBC    Disposition: Follow-up in 6 months.  Signed, Jonelle SidleSamuel G. Abdalrahman Clementson, MD, [facility] 03/10/2017 2:15 PM    [facility] [address], [place], [place] [address] Phone: [phone]) [phone]; Fax: [phone]

## 2017-03-10 ENCOUNTER — Encounter: Payer: Self-pay | Admitting: Cardiology

## 2017-03-10 ENCOUNTER — Ambulatory Visit: Payer: Medicare Other | Admitting: Cardiology

## 2017-03-10 VITALS — BP 118/62 | HR 66 | Ht 62.0 in | Wt 205.0 lb

## 2017-03-10 DIAGNOSIS — I2510 Atherosclerotic heart disease of native coronary artery without angina pectoris: Secondary | ICD-10-CM

## 2017-03-10 DIAGNOSIS — Z950 Presence of cardiac pacemaker: Secondary | ICD-10-CM

## 2017-03-10 DIAGNOSIS — I480 Paroxysmal atrial fibrillation: Secondary | ICD-10-CM | POA: Diagnosis not present

## 2017-03-10 DIAGNOSIS — I5032 Chronic diastolic (congestive) heart failure: Secondary | ICD-10-CM | POA: Diagnosis not present

## 2017-03-10 NOTE — Patient Instructions (Addendum)
Medication Instructions:  Your physician recommends that you continue on your current medications as directed. Please refer to the Current Medication list given to you today.  Labwork:  BMET/CBC  Orders given today  Testing/Procedures: NONE  Follow-Up: Your physician wants you to follow-up in: 6 MONTHS WITH DR. Diona BrownerMCDOWELL You will receive a reminder letter in the mail two months in advance. If you don't receive a letter, please call our office to schedule the follow-up appointment.  Any Other Special Instructions Will Be Listed Below (If Applicable).  If you need a refill on your cardiac medications before your next appointment, please call your pharmacy.

## 2017-03-22 ENCOUNTER — Other Ambulatory Visit: Payer: Self-pay | Admitting: Cardiology

## 2017-03-22 ENCOUNTER — Telehealth: Payer: Self-pay

## 2017-03-22 NOTE — Telephone Encounter (Signed)
LMTCB

## 2017-03-22 NOTE — Telephone Encounter (Signed)
-----   Message from Aishani Kalis, LPN sent at 03/22/2017 10:10 AM EST -----   ----- Message ----- From: McDowell, Samuel G, MD Sent: 03/22/2017   8:26 AM To: Lydia M Anderson, LPN  Results reviewed. Hgb and renal function stable. Continue with current regimen. A copy of this test should be forwarded to Tapper, David B., MD. 

## 2017-03-22 NOTE — Telephone Encounter (Signed)
-----   Message from Norva PavlovKailey Jacorian Golaszewski, LPN sent at 16/10/960411/27/2018 10:10 AM EST -----   ----- Message ----- From: Jonelle SidleMcDowell, Samuel G, MD Sent: 03/22/2017   8:26 AM To: Eustace MooreLydia M Anderson, LPN  Results reviewed. Hgb and renal function stable. Continue with current regimen. A copy of this test should be forwarded to Louie Bostonapper, David B., MD.

## 2017-03-22 NOTE — Telephone Encounter (Signed)
Patients son notified. Routed to PCP  

## 2017-06-13 ENCOUNTER — Encounter: Payer: Self-pay | Admitting: Internal Medicine

## 2017-06-13 ENCOUNTER — Ambulatory Visit: Payer: Medicare Other | Admitting: Internal Medicine

## 2017-06-13 VITALS — BP 124/64 | HR 87 | Ht 60.0 in

## 2017-06-13 DIAGNOSIS — I480 Paroxysmal atrial fibrillation: Secondary | ICD-10-CM | POA: Diagnosis not present

## 2017-06-13 DIAGNOSIS — I5032 Chronic diastolic (congestive) heart failure: Secondary | ICD-10-CM | POA: Diagnosis not present

## 2017-06-13 NOTE — Progress Notes (Signed)
HPI Brianna Chandler returns today for followup. She is a very pleasant 84 year old woman with morbid obesity, coronary artery disease status post stenting in 2010, with symptomatic bradycardia, status post permanent pacemaker insertion. In the interim, she has been stable. She denies any falls or bleeding. No palpitations.  She has not been in the hospital.  Appetite is good. No Known Allergies   Current Outpatient Medications  Medication Sig Dispense Refill  . amLODipine (NORVASC) 5 MG tablet TAKE ONE TABLET BY MOUTH EVERY MORNING 30 tablet 6  . atorvastatin (LIPITOR) 40 MG tablet Take 1 tablet (40 mg total) by mouth at bedtime. 30 tablet 3  . carvedilol (COREG) 12.5 MG tablet TAKE 1/2 TABLET BY MOUTH TWICE DAILY 30 tablet 5  . furosemide (LASIX) 40 MG tablet Take 1 tablet (40 mg total) by mouth 2 (two) times daily. Take one extra tablet daily prn for excess fluid 60 tablet 1  . isosorbide mononitrate (IMDUR) 30 MG 24 hr tablet TAKE ONE TABLET BY MOUTH EVERY EVENING 90 tablet 3  . losartan (COZAAR) 50 MG tablet Take 50 mg by mouth every morning.     . metFORMIN (GLUCOPHAGE) 500 MG tablet Take 500 mg by mouth 2 (two) times daily with a meal.    . Multiple Vitamin (MULTIVITAMIN) tablet Take 1 tablet by mouth daily.      . nitroGLYCERIN (NITROSTAT) 0.4 MG SL tablet Place 0.4 mg under the tongue every 5 (five) minutes x 3 doses as needed for chest pain (MAX 3 TABLETS).     Marland Kitchen oxybutynin (DITROPAN-XL) 5 MG 24 hr tablet Take 5 mg by mouth daily.    . traMADol (ULTRAM) 50 MG tablet Take 50-100 mg by mouth 2 (two) times daily as needed for moderate pain.     Marland Kitchen XARELTO 15 MG TABS tablet TAKE ONE TABLET BY MOUTH EVERY MORNING 30 tablet 6   No current facility-administered medications for this visit.      Past Medical History:  Diagnosis Date  . Chronic diastolic CHF (congestive heart failure) (HCC)    LVEF 60-65%, grade 2 diastolic dysfunction, PASP  . CKD (chronic kidney disease)  stage 2, GFR 60-89 ml/min   . Coronary atherosclerosis of native coronary artery February 05, 2009   NSTEMI s/p DES prox LAD  . Essential hypertension, benign   . Obesity   . Osteoarthritis   . PAF (paroxysmal atrial fibrillation) (HCC)   . Symptomatic bradycardia    12/2011 s/p MDT Sensia DC PPM, [id number] H  . Syncope   . Type 2 diabetes mellitus (HCC)     ROS:   All systems reviewed and negative except as noted in the HPI.   Past Surgical History:  Procedure Laterality Date  . CATARACT EXTRACTION    . PACEMAKER INSERTION    . PERMANENT PACEMAKER INSERTION Left 01/12/2012   Procedure: PERMANENT PACEMAKER INSERTION;  Surgeon: Marinus Maw, MD;  Location: [facility] CATH LAB;  Service: Cardiovascular;  Laterality: Left;  . SLT LASER APPLICATION Left 11/13/2012   Procedure: SLT LASER APPLICATION;  Surgeon: Susa Simmonds, MD;  Location: [facility] ORS;  Service: Ophthalmology;  Laterality: Left;  . TOTAL ABDOMINAL HYSTERECTOMY       Family History  Problem Relation Age of Onset  . Diabetes Unknown   . Hypertension Unknown   . Coronary artery disease Unknown      Social History   Socioeconomic History  . Marital status: Widowed    Spouse name: Not on  file  . Number of children: Not on file  . Years of education: Not on file  . Highest education level: Not on file  Social Needs  . Financial resource strain: Not on file  . Food insecurity - worry: Not on file  . Food insecurity - inability: Not on file  . Transportation needs - medical: Not on file  . Transportation needs - non-medical: Not on file  Occupational History  . Occupation: Retired  Tobacco Use  . Smoking status: Never Smoker  . Smokeless tobacco: Former NeurosurgeonUser    Types: Snuff  Substance and Sexual Activity  . Alcohol use: No    Alcohol/week: 0.0 oz  . Drug use: No  . Sexual activity: Not on file  Other Topics Concern  . Not on file  Social History Narrative  . Not on file     BP 124/64 (BP Location: Right  Arm)   Pulse 87   Ht 5' (1.524 m)   SpO2 92%   BMI 40.04 kg/m   Physical Exam:  obese appearing 84 yo woman, NAD HEENT: Unremarkable Neck:  6 cm JVD, no thyromegally Lymphatics:  No adenopathy Back:  No CVA tenderness Lungs:  Clear with no wheezes HEART:  Regular rate rhythm, no murmurs, no rubs, no clicks Abd:  soft, positive bowel sounds, no organomegally, no rebound, no guarding Ext:  2 plus pulses, no edema, no cyanosis, no clubbing Skin:  No rashes no nodules Neuro:  CN II through XII intact, motor grossly intact   DEVICE  Normal device function.  See PaceArt for details.   Assess/Plan: 1. PPM - her Medtronic DDD PM is working normally. We will recheck in several months 2. Sinus node dysfunction - she is asymptomatic, s/p PPM insertion. 3. HTN - her blood pressure is well controlled. Will continue her current meds.  Leonia ReevesGregg Cheyenna Pankowski,M.D.

## 2017-06-13 NOTE — Patient Instructions (Signed)
Medication Instructions:  Your physician recommends that you continue on your current medications as directed. Please refer to the Current Medication list given to you today.   Labwork: NONE   Testing/Procedures: NONE   Follow-Up: Your physician wants you to follow-up in: 1 Year with Dr. Taylor. You will receive a reminder letter in the mail two months in advance. If you don't receive a letter, please call our office to schedule the follow-up appointment.   Any Other Special Instructions Will Be Listed Below (If Applicable).     If you need a refill on your cardiac medications before your next appointment, please call your pharmacy.  Thank you for choosing [facility]!   

## 2017-08-21 ENCOUNTER — Other Ambulatory Visit: Payer: Self-pay | Admitting: Cardiology

## 2017-09-12 NOTE — Progress Notes (Deleted)
Cardiology Office Note  Date: 09/12/2017   ID: Brianna Chandler, DOB Dec 01, 1923, MRN [id number]  PCP: Louie Boston., MD  Primary Cardiologist: Nona Dell, MD   No chief complaint on file.   History of Present Illness: Brianna Chandler is a 84 y.o. female last seen in November 2018.  She is followed by Dr. Ladona Ridgel in the device clinic, Medtronic pacemaker in place.  Past Medical History:  Diagnosis Date  . Chronic diastolic CHF (congestive heart failure) (HCC)    LVEF 60-65%, grade 2 diastolic dysfunction, PASP  . CKD (chronic kidney disease) stage 2, GFR 60-89 ml/min   . Coronary atherosclerosis of native coronary artery February 05, 2009   NSTEMI s/p DES prox LAD  . Essential hypertension, benign   . Obesity   . Osteoarthritis   . PAF (paroxysmal atrial fibrillation) (HCC)   . Symptomatic bradycardia    12/2011 s/p MDT Sensia DC PPM, [id number] H  . Syncope   . Type 2 diabetes mellitus (HCC)     Past Surgical History:  Procedure Laterality Date  . CATARACT EXTRACTION    . PACEMAKER INSERTION    . PERMANENT PACEMAKER INSERTION Left 01/12/2012   Procedure: PERMANENT PACEMAKER INSERTION;  Surgeon: Marinus Maw, MD;  Location: [facility] CATH LAB;  Service: Cardiovascular;  Laterality: Left;  . SLT LASER APPLICATION Left 11/13/2012   Procedure: SLT LASER APPLICATION;  Surgeon: Susa Simmonds, MD;  Location: [facility] ORS;  Service: Ophthalmology;  Laterality: Left;  . TOTAL ABDOMINAL HYSTERECTOMY      Current Outpatient Medications  Medication Sig Dispense Refill  . amLODipine (NORVASC) 5 MG tablet TAKE ONE TABLET BY MOUTH EVERY MORNING 30 tablet 6  . atorvastatin (LIPITOR) 40 MG tablet Take 1 tablet (40 mg total) by mouth at bedtime. 30 tablet 3  . carvedilol (COREG) 12.5 MG tablet TAKE 1/2 TABLET BY MOUTH TWICE DAILY 30 tablet 5  . furosemide (LASIX) 40 MG tablet Take 1 tablet (40 mg total) by mouth 2 (two) times daily. Take one extra tablet daily prn for excess fluid 60  tablet 1  . isosorbide mononitrate (IMDUR) 30 MG 24 hr tablet TAKE ONE TABLET BY MOUTH EVERY EVENING 90 tablet 3  . losartan (COZAAR) 50 MG tablet Take 50 mg by mouth every morning.     . metFORMIN (GLUCOPHAGE) 500 MG tablet Take 500 mg by mouth 2 (two) times daily with a meal.    . Multiple Vitamin (MULTIVITAMIN) tablet Take 1 tablet by mouth daily.      . nitroGLYCERIN (NITROSTAT) 0.4 MG SL tablet Place 0.4 mg under the tongue every 5 (five) minutes x 3 doses as needed for chest pain (MAX 3 TABLETS).     Marland Kitchen oxybutynin (DITROPAN-XL) 5 MG 24 hr tablet Take 5 mg by mouth daily.    . traMADol (ULTRAM) 50 MG tablet Take 50-100 mg by mouth 2 (two) times daily as needed for moderate pain.     Marland Kitchen XARELTO 15 MG TABS tablet TAKE ONE TABLET BY MOUTH EVERY MORNING 30 tablet 6   No current facility-administered medications for this visit.    Allergies:  Patient has no known allergies.   Social History: The patient  reports that she has never smoked. She quit smokeless tobacco use about 27 years ago. Her smokeless tobacco use included snuff. She reports that she does not drink alcohol or use drugs.   Family History: The patient's family history includes Coronary artery disease in her unknown relative; Diabetes  in her unknown relative; Hypertension in her unknown relative.   ROS:  Please see the history of present illness. Otherwise, complete review of systems is positive for {NONE DEFAULTED:18576::"none"}.  All other systems are reviewed and negative.   Physical Exam: VS:  There were no vitals taken for this visit., BMI There is no height or weight on file to calculate BMI.  Wt Readings from Last 3 Encounters:  03/10/17 205 lb (93 kg)  08/31/16 203 lb 6.4 oz (92.3 kg)  06/21/16 207 lb (93.9 kg)    General: Patient appears comfortable at rest. HEENT: Conjunctiva and lids normal, oropharynx clear with moist mucosa. Neck: Supple, no elevated JVP or carotid bruits, no thyromegaly. Lungs: Clear to  auscultation, nonlabored breathing at rest. Cardiac: Regular rate and rhythm, no S3 or significant systolic murmur, no pericardial rub. Abdomen: Soft, nontender, no hepatomegaly, bowel sounds present, no guarding or rebound. Extremities: No pitting edema, distal pulses 2+. Skin: Warm and dry. Musculoskeletal: No kyphosis. Neuropsychiatric: Alert and oriented x3, affect grossly appropriate.  ECG: I personally reviewed the tracing from 08/31/2016 which showed probable sinus rhythm with indistinct P waves, poor R wave progression anteriorly.  Recent Labwork:  November 2018: BUN 35, creatinine 0.99, potassium 4.0, hemoglobin 12.9, platelets 170  Other Studies Reviewed Today:  Echocardiogram 07/01/2015: Study Conclusions  - Left ventricle: The cavity size was normal. Wall thickness was   increased in a pattern of moderate LVH. Systolic function was   normal. The estimated ejection fraction was in the range of 60%   to 65%. Wall motion was normal; there were no regional wall   motion abnormalities. Features are consistent with a pseudonormal   left ventricular filling pattern, with concomitant abnormal   relaxation and increased filling pressure (grade 2 diastolic   dysfunction). - Aortic valve: Mildly calcified annulus. Trileaflet; mildly   thickened leaflets. Valve area (VTI): 1.68 cm^2. Valve area   (Vmax): 1.56 cm^2. - Left atrium: The atrium was severely dilated. - Right atrium: The atrium was mildly dilated. - Atrial septum: No defect or patent foramen ovale was identified. - Pulmonary arteries: Systolic pressure was moderately increased.   PA peak pressure: 44 mm Hg (S). - Technically adequate study.  Assessment and Plan:    Current medicines were reviewed with the patient today.  No orders of the defined types were placed in this encounter.   Disposition:  Signed, Jonelle Sidle, MD, [facility] 09/12/2017 10:31 AM    [facility] at [facility] [address] [place], [place], [place] [address] Phone: [phone]; Fax: [phone]

## 2017-09-13 ENCOUNTER — Ambulatory Visit: Payer: Medicare Other | Admitting: Cardiology

## 2017-10-18 ENCOUNTER — Other Ambulatory Visit: Payer: Self-pay | Admitting: Cardiology

## 2017-10-28 ENCOUNTER — Encounter: Payer: Self-pay | Admitting: *Deleted

## 2017-10-28 NOTE — Progress Notes (Signed)
Cardiology Office Note  Date: 10/31/2017   ID: Brianna Chandler, DOB 02-18-1924, MRN [id number]  PCP: Kela Millin, MD  Primary Cardiologist: Nona Dell, MD   Chief Complaint  Patient presents with  . PAF    History of Present Illness: Brianna Chandler is a 84 y.o. female last seen in November 2018.  She is here with her son today for a follow-up visit.  She remains fairly sedentary, in a wheelchair today and on continuous oxygen via nasal cannula.  She does not report any chest pain or palpitations.  She continues to see Dr. Ladona Ridgel in the device clinic, Medtronic pacemaker in place.  She is on Xarelto for stroke prophylaxis with paroxysmal atrial fibrillation.  No obvious reported bleeding problems.  I personally reviewed her ECG today which shows an atrial paced rhythm with left anterior fascicular block and poor R wave progression, lead motion artifact.  Past Medical History:  Diagnosis Date  . Chronic diastolic CHF (congestive heart failure) (HCC)    LVEF 60-65%, grade 2 diastolic dysfunction, PASP  . CKD (chronic kidney disease) stage 2, GFR 60-89 ml/min   . Coronary atherosclerosis of native coronary artery February 05, 2009   NSTEMI s/p DES prox LAD  . Essential hypertension, benign   . Obesity   . Osteoarthritis   . PAF (paroxysmal atrial fibrillation) (HCC)   . Symptomatic bradycardia    12/2011 s/p MDT Sensia DC PPM, [id number]  . Syncope   . Type 2 diabetes mellitus (HCC)     Past Surgical History:  Procedure Laterality Date  . CATARACT EXTRACTION    . PACEMAKER INSERTION    . PERMANENT PACEMAKER INSERTION Left 01/12/2012   Procedure: PERMANENT PACEMAKER INSERTION;  Surgeon: Marinus Maw, MD;  Location: [facility] CATH LAB;  Service: Cardiovascular;  Laterality: Left;  . SLT LASER APPLICATION Left 11/13/2012   Procedure: SLT LASER APPLICATION;  Surgeon: Susa Simmonds, MD;  Location: [facility] ORS;  Service: Ophthalmology;  Laterality: Left;  . TOTAL  ABDOMINAL HYSTERECTOMY      Current Outpatient Medications  Medication Sig Dispense Refill  . amLODipine (NORVASC) 5 MG tablet TAKE ONE TABLET BY MOUTH EVERY MORNING 30 tablet 6  . atorvastatin (LIPITOR) 40 MG tablet Take 1 tablet (40 mg total) by mouth at bedtime. 30 tablet 3  . carvedilol (COREG) 12.5 MG tablet TAKE 1/2 TABLET BY MOUTH TWICE DAILY 30 tablet 5  . furosemide (LASIX) 40 MG tablet Take 1 tablet (40 mg total) by mouth 2 (two) times daily. Take one extra tablet daily prn for excess fluid 60 tablet 1  . isosorbide mononitrate (IMDUR) 30 MG 24 hr tablet TAKE ONE TABLET BY MOUTH EVERY EVENING 90 tablet 3  . losartan (COZAAR) 50 MG tablet Take 50 mg by mouth every morning.     . metFORMIN (GLUCOPHAGE) 500 MG tablet Take 500 mg by mouth 2 (two) times daily with a meal.    . Multiple Vitamin (MULTIVITAMIN) tablet Take 1 tablet by mouth daily.      . nitroGLYCERIN (NITROSTAT) 0.4 MG SL tablet Place 0.4 mg under the tongue every 5 (five) minutes x 3 doses as needed for chest pain (MAX 3 TABLETS).     Marland Kitchen oxybutynin (DITROPAN-XL) 5 MG 24 hr tablet Take 5 mg by mouth daily.    Carlena Hurl 15 MG TABS tablet TAKE ONE TABLET BY MOUTH EVERY MORNING 30 tablet 0   No current facility-administered medications for this visit.  Allergies:  Patient has no known allergies.   Social History: The patient  reports that she has never smoked. She quit smokeless tobacco use about 27 years ago. Her smokeless tobacco use included snuff. She reports that she does not drink alcohol or use drugs.   ROS:  Please see the history of present illness. Otherwise, complete review of systems is positive for hearing loss.  All other systems are reviewed and negative.   Physical Exam: VS:  BP 134/68   Pulse 61   Ht 5\' 2"  (1.575 m)   Wt 202 lb 9.6 oz (91.9 kg)   SpO2 97%   BMI 37.06 kg/m , BMI Body mass index is 37.06 kg/m.  Wt Readings from Last 3 Encounters:  10/31/17 202 lb 9.6 oz (91.9 kg)  03/10/17 205 lb  (93 kg)  08/31/16 203 lb 6.4 oz (92.3 kg)    General: Elderly woman in wheelchair, wearing oxygen via nasal cannula. HEENT: Conjunctiva and lids normal, oropharynx clear. Neck: Supple, no elevated JVP or carotid bruits, no thyromegaly. Lungs: Clear to auscultation, nonlabored breathing at rest. Cardiac: Regular rate and rhythm, no S3, soft systolic murmur. Abdomen: Soft, nontender, bowel sounds present. Extremities: Mild ankle edema, distal pulses 2+. Skin: Warm and dry. Musculoskeletal: No kyphosis. Neuropsychiatric: Alert and oriented x3, affect grossly appropriate.  ECG: I personally reviewed the tracing from 08/31/2016 which showed sinus rhythm with prolonged PR interval, IVCD.  Recent Labwork:  November 2018: BUN 35, creatinine 0.99, potassium 4.0, hemoglobin 12.9, platelets 170  Other Studies Reviewed Today:  Echocardiogram 07/01/2015: Study Conclusions  - Left ventricle: The cavity size was normal. Wall thickness was   increased in a pattern of moderate LVH. Systolic function was   normal. The estimated ejection fraction was in the range of 60%   to 65%. Wall motion was normal; there were no regional wall   motion abnormalities. Features are consistent with a pseudonormal   left ventricular filling pattern, with concomitant abnormal   relaxation and increased filling pressure (grade 2 diastolic   dysfunction). - Aortic valve: Mildly calcified annulus. Trileaflet; mildly   thickened leaflets. Valve area (VTI): 1.68 cm^2. Valve area   (Vmax): 1.56 cm^2. - Left atrium: The atrium was severely dilated. - Right atrium: The atrium was mildly dilated. - Atrial septum: No defect or patent foramen ovale was identified. - Pulmonary arteries: Systolic pressure was moderately increased.   PA peak pressure: 44 mm Hg (S). - Technically adequate study.  Assessment and Plan:  1.  Paroxysmal atrial fibrillation.  She reports no palpitations.  ECG reviewed showing an atrial paced  rhythm today.  Continue Coreg and Xarelto with follow-up CBC and BMET arranged.  2.  Medtronic pacemaker in place, keep follow-up with Dr. Ladona Ridgelaylor.  3.  CAD with history of DES to the LAD, no active angina symptoms, plan to continue radical therapy and conservative follow-up.  4.  Chronic diastolic heart failure.,  No unusual weight gain on current dose of Lasix.  Current medicines were reviewed with the patient today.   Orders Placed This Encounter  Procedures  . CBC  . Basic metabolic panel  . EKG 12-Lead    Disposition: Follow-up in 6 months.  Signed, Jonelle SidleSamuel G. McDowell, MD, [facility] 10/31/2017 2:52 PM    [facility] [address], [place], [place] [address] Phone: [phone]; Fax: [phone]

## 2017-10-31 ENCOUNTER — Ambulatory Visit: Payer: Medicare Other | Admitting: Cardiology

## 2017-10-31 ENCOUNTER — Encounter: Payer: Self-pay | Admitting: Cardiology

## 2017-10-31 VITALS — BP 134/68 | HR 61 | Ht 62.0 in | Wt 202.6 lb

## 2017-10-31 DIAGNOSIS — I480 Paroxysmal atrial fibrillation: Secondary | ICD-10-CM

## 2017-10-31 DIAGNOSIS — I2510 Atherosclerotic heart disease of native coronary artery without angina pectoris: Secondary | ICD-10-CM

## 2017-10-31 DIAGNOSIS — Z950 Presence of cardiac pacemaker: Secondary | ICD-10-CM | POA: Diagnosis not present

## 2017-10-31 DIAGNOSIS — I5032 Chronic diastolic (congestive) heart failure: Secondary | ICD-10-CM | POA: Diagnosis not present

## 2017-10-31 NOTE — Patient Instructions (Signed)
Medication Instructions:  Your physician recommends that you continue on your current medications as directed. Please refer to the Current Medication list given to you today.  Labwork: CBC/BMET Orders given today   Testing/Procedures: NONE  Follow-Up: Your physician wants you to follow-up in: 6 MONTHS WITH DR. MCDOWELL. You will receive a reminder letter in the mail two months in advance. If you don't receive a letter, please call our office to schedule the follow-up appointment.  Any Other Special Instructions Will Be Listed Below (If Applicable).  If you need a refill on your cardiac medications before your next appointment, please call your pharmacy. 

## 2017-11-10 ENCOUNTER — Other Ambulatory Visit: Payer: Self-pay | Admitting: Cardiology

## 2017-11-10 LAB — BASIC METABOLIC PANEL WITH GFR
BUN: 22 mg/dL (ref 7–25)
CHLORIDE: 105 mmol/L (ref 98–110)
CO2: 31 mmol/L (ref 20–32)
Calcium: 9.6 mg/dL (ref 8.6–10.4)
Creat: 0.82 mg/dL (ref 0.60–0.88)
GFR, EST NON AFRICAN AMERICAN: 63 mL/min/{1.73_m2} (ref >=60)
GFR, Est African American: 73 mL/min/{1.73_m2} (ref >=60)
GLUCOSE: 193 mg/dL — AB (ref 65–139)
POTASSIUM: 3.9 mmol/L (ref 3.5–5.3)
SODIUM: 143 mmol/L (ref 135–146)

## 2017-11-10 LAB — CBC
HCT: 32.5 % — ABNORMAL LOW (ref 35.0–45.0)
Hemoglobin: 10.7 g/dL — ABNORMAL LOW (ref 11.7–15.5)
MCH: 29.1 pg (ref 27.0–33.0)
MCHC: 32.9 g/dL (ref 32.0–36.0)
MCV: 88.3 fL (ref 80.0–100.0)
MPV: 11.9 fL (ref 7.5–12.5)
PLATELETS: 137 10*3/uL — AB (ref 140–400)
RBC: 3.68 10*6/uL — AB (ref 3.80–5.10)
RDW: 11.9 % (ref 11.0–15.0)
WBC: 7.4 10*3/uL (ref 3.8–10.8)

## 2017-11-15 ENCOUNTER — Telehealth: Payer: Self-pay | Admitting: *Deleted

## 2017-11-15 NOTE — Telephone Encounter (Signed)
Son Jerry informed. Copy sent to PCP 

## 2017-11-15 NOTE — Telephone Encounter (Signed)
-----   Message from Jonelle SidleSamuel G McDowell, MD sent at 11/14/2017 10:49 AM EDT ----- Results reviewed.  Renal function and potassium are stable.  She has chronic anemia, hemoglobin is actually higher than last assessment.  Would continue with current cardiac regimen. A copy of this test should be forwarded to Kela MillinBarrino, Alethea Y, MD.

## 2017-11-29 ENCOUNTER — Other Ambulatory Visit: Payer: Self-pay | Admitting: *Deleted

## 2017-11-29 MED ORDER — RIVAROXABAN 15 MG PO TABS: 15.0000 mg | ORAL_TABLET | Freq: Every morning | ORAL | 3 refills | Status: DC

## 2018-02-14 IMAGING — DX DG CHEST 2V
3 series · 3 of 3 positions shown · non-contrast
Comparison: 07/02/2015

CLINICAL DATA: Shortness of Breath

EXAM:
CHEST  2 VIEW

[chest lat (1 of 2)]
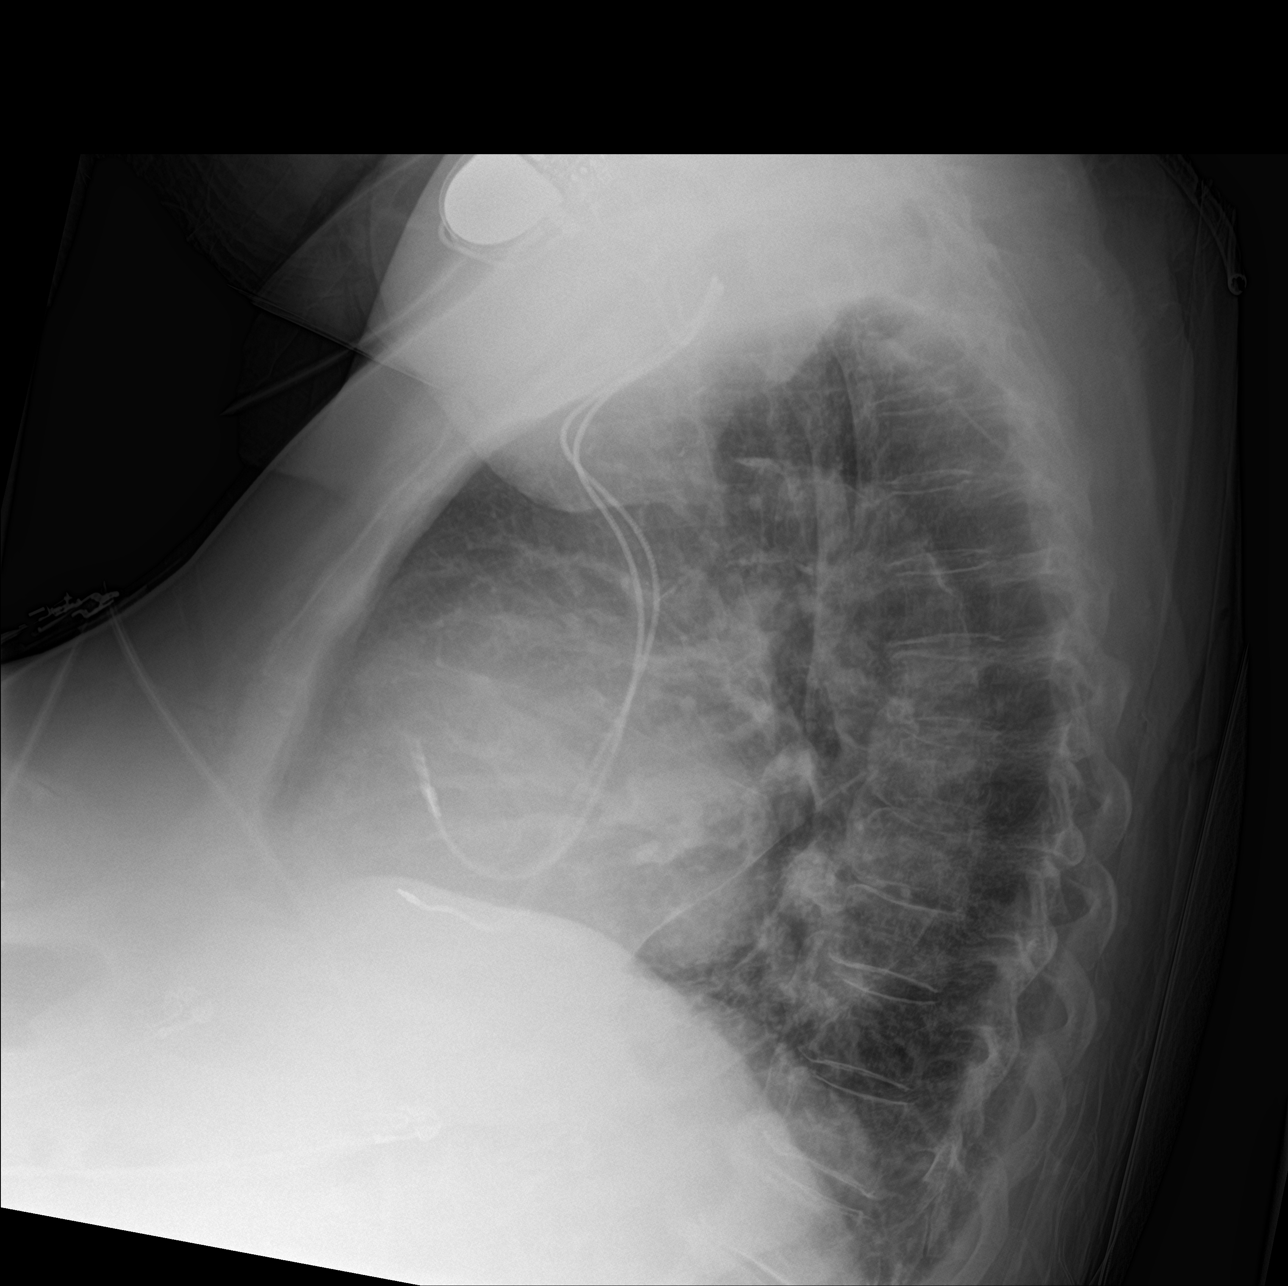

[chest ap]
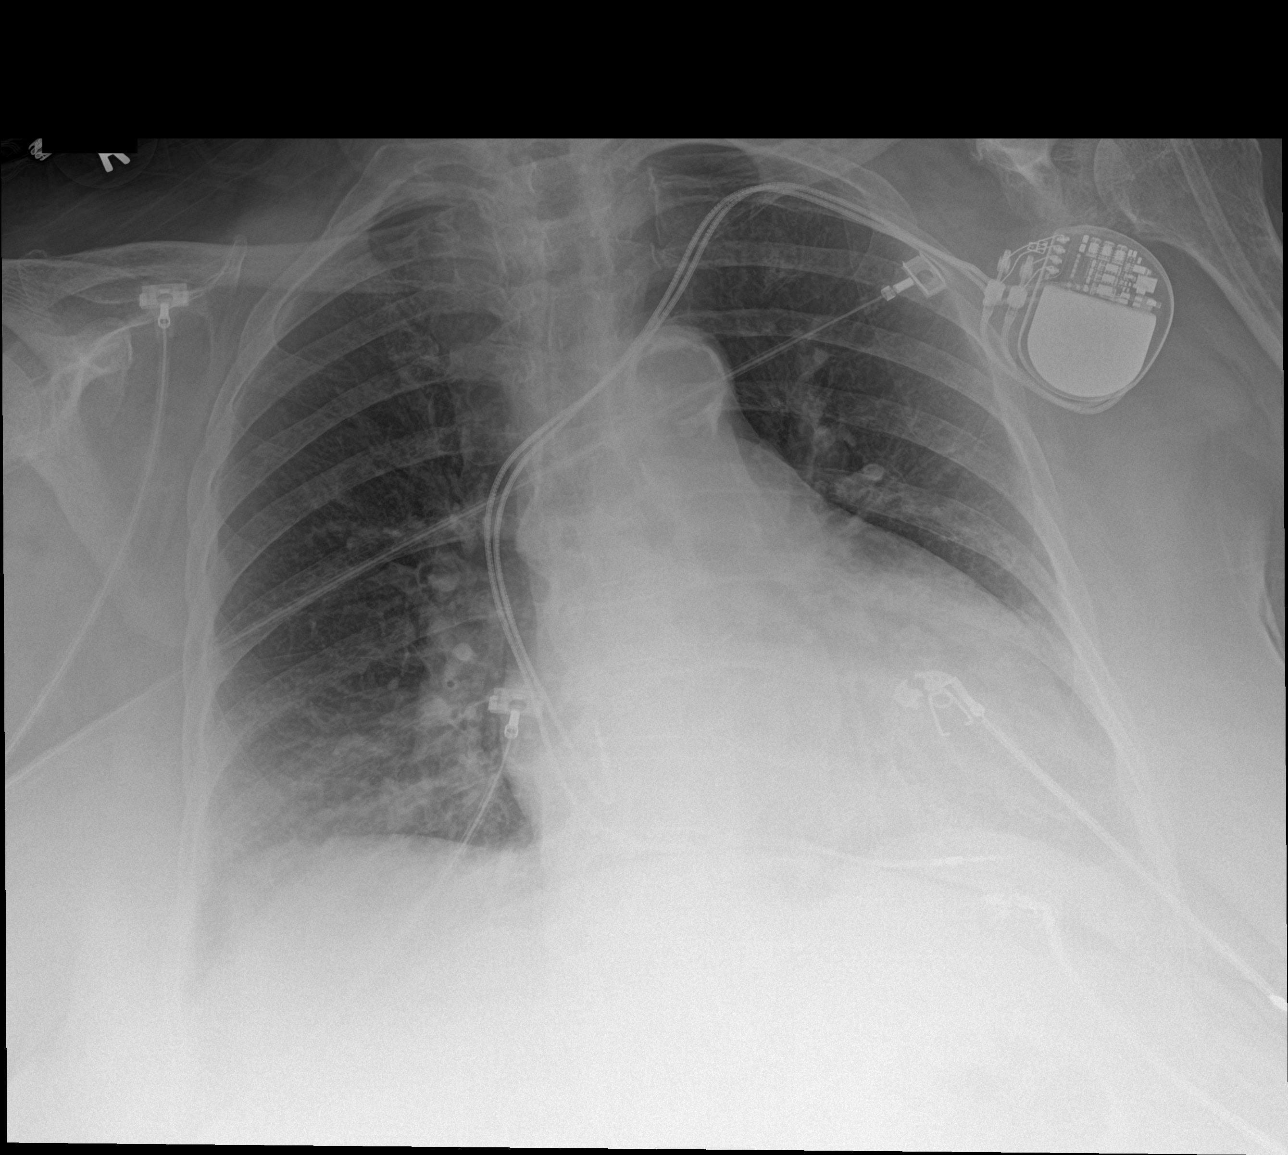

[chest lat (2 of 2)]
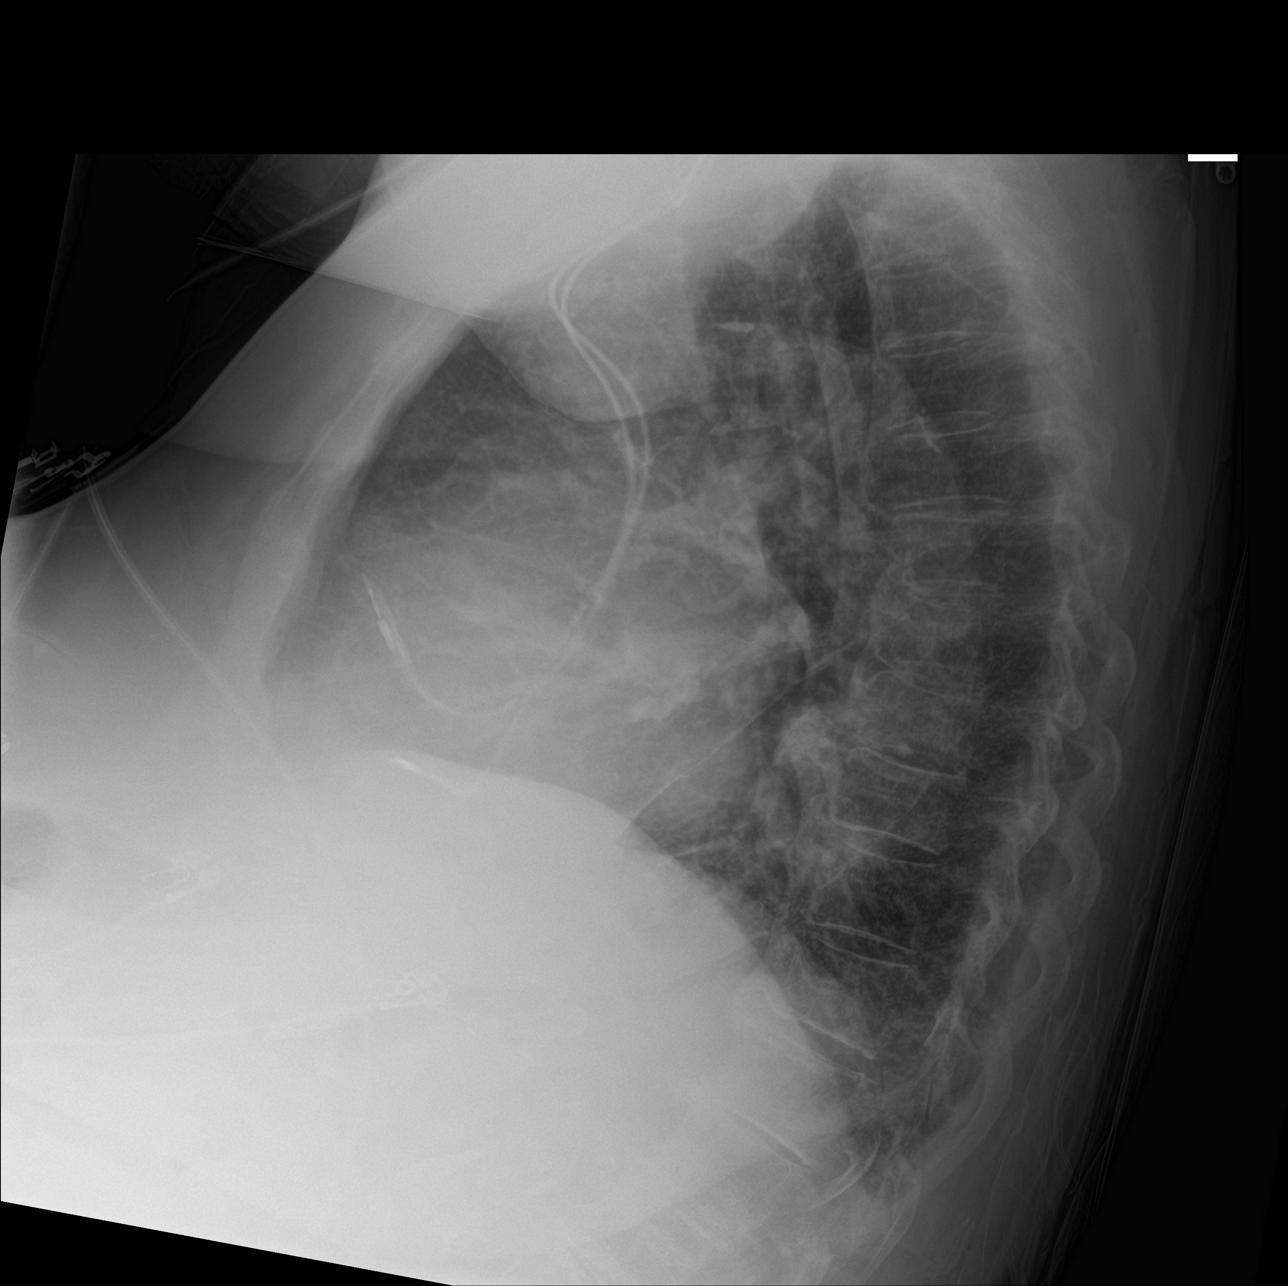

[3 of 3 positions shown; findings below may reference images not displayed]

FINDINGS: Cardiac shadow remains enlarged. A pacing device is again seen and
stable. The lungs are well aerated bilaterally without focal
infiltrate or sizable effusion. No acute bony abnormality is seen.
IMPRESSION: No active cardiopulmonary disease.

## 2018-02-16 ENCOUNTER — Other Ambulatory Visit: Payer: Self-pay | Admitting: Cardiology

## 2018-03-21 ENCOUNTER — Other Ambulatory Visit: Payer: Self-pay | Admitting: *Deleted

## 2018-03-21 MED ORDER — RIVAROXABAN 15 MG PO TABS: 15.0000 mg | ORAL_TABLET | Freq: Every morning | ORAL | 6 refills | Status: DC

## 2018-03-21 MED ORDER — AMLODIPINE BESYLATE 5 MG PO TABS: 5.0000 mg | ORAL_TABLET | Freq: Every morning | ORAL | 6 refills | Status: DC

## 2018-05-01 ENCOUNTER — Encounter: Payer: Self-pay | Admitting: *Deleted

## 2018-05-01 NOTE — Progress Notes (Signed)
Cardiology Office Note  Date: 05/02/2018   ID: Brianna FullerJean C Spurlock, DOB 03/12/1924, MRN [id number]  PCP: Kela MillinBarrino, Alethea Y, MD  Primary Cardiologist: Nona DellSamuel Shaina Gullatt, MD   Chief Complaint  Patient presents with  . Atrial Fibrillation    History of Present Illness: Brianna Chandler is a 85 y.o. female last seen in July 2019.  She presents for follow-up, here with her son today.  Still lives at home with his assistance.  She is in a wheelchair and on supplemental oxygen as before.  No obvious major change in clinical status, very sedentary at baseline.  I reviewed her medications which are outlined below.  She denies any significant bleeding problems on Xarelto.  Lab work from July is outlined below.  She is due to see her PCP Dr. Otilio MiuBarrino next month.  She sees Dr. Ladona Ridgelaylor in the device clinic, Medtronic pacemaker in place.  Past Medical History:  Diagnosis Date  . Chronic diastolic CHF (congestive heart failure) (HCC)    LVEF 60-65%, grade 2 diastolic dysfunction, PASP 54mmHg  . CKD (chronic kidney disease) stage 2, GFR 60-89 ml/min   . Coronary atherosclerosis of native coronary artery February 05, 2009   NSTEMI s/p DES prox LAD  . Essential hypertension, benign   . Obesity   . Osteoarthritis   . PAF (paroxysmal atrial fibrillation) (HCC)   . Symptomatic bradycardia    12/2011 s/p MDT Sensia DC PPM, [id number] H  . Syncope   . Type 2 diabetes mellitus (HCC)     Past Surgical History:  Procedure Laterality Date  . CATARACT EXTRACTION    . PACEMAKER INSERTION    . PERMANENT PACEMAKER INSERTION Left 01/12/2012   Procedure: PERMANENT PACEMAKER INSERTION;  Surgeon: Marinus MawGregg W Taylor, MD;  [facility];  Service: Cardiovascular;  Laterality: Left;  . SLT LASER APPLICATION Left 11/13/2012   Procedure: SLT LASER APPLICATION;  Surgeon: Susa Simmondsarroll F Haines, MD;  Location: [facility] ORS;  Service: Ophthalmology;  Laterality: Left;  . TOTAL ABDOMINAL HYSTERECTOMY      Current Outpatient  Medications  Medication Sig Dispense Refill  . amLODipine (NORVASC) 5 MG tablet Take 1 tablet (5 mg total) by mouth every morning. 30 tablet 6  . atorvastatin (LIPITOR) 40 MG tablet Take 1 tablet (40 mg total) by mouth at bedtime. 30 tablet 3  . carvedilol (COREG) 12.5 MG tablet TAKE 1/2 TABLET BY MOUTH TWICE DAILY 30 tablet 5  . furosemide (LASIX) 40 MG tablet Take 1 tablet (40 mg total) by mouth 2 (two) times daily. Take one extra tablet daily prn for excess fluid 60 tablet 1  . isosorbide mononitrate (IMDUR) 30 MG 24 hr tablet TAKE ONE TABLET BY MOUTH EVERY EVENING 90 tablet 3  . losartan (COZAAR) 50 MG tablet Take 50 mg by mouth every morning.     . metFORMIN (GLUCOPHAGE) 500 MG tablet Take 500 mg by mouth 2 (two) times daily with a meal.    . Multiple Vitamin (MULTIVITAMIN) tablet Take 1 tablet by mouth daily.      . nitroGLYCERIN (NITROSTAT) 0.4 MG SL tablet Place 0.4 mg under the tongue every 5 (five) minutes x 3 doses as needed for chest pain (MAX 3 TABLETS).     Marland Kitchen. oxybutynin (DITROPAN-XL) 5 MG 24 hr tablet Take 5 mg by mouth daily.    . Rivaroxaban (XARELTO) 15 MG TABS tablet Take 1 tablet (15 mg total) by mouth every morning. 30 tablet 6   No current facility-administered medications for this  visit.    Allergies:  Patient has no known allergies.   Social History: The patient  reports that she has never smoked. She quit smokeless tobacco use about 28 years ago.  Her smokeless tobacco use included snuff. She reports that she does not drink alcohol or use drugs.   ROS:  Please see the history of present illness. Otherwise, complete review of systems is positive for hearing loss.  All other systems are reviewed and negative.   Physical Exam: VS:  BP 128/78   Pulse 69   Ht 5\' 1"  (1.549 m)   Wt 202 lb (91.6 kg)   SpO2 98%   BMI 38.17 kg/m , BMI Body mass index is 38.17 kg/m.  Wt Readings from Last 3 Encounters:  05/02/18 202 lb (91.6 kg)  10/31/17 202 lb 9.6 oz (91.9 kg)   03/10/17 205 lb (93 kg)    General: Elderly woman in wheelchair.  Wearing oxygen via nasal cannula. HEENT: Conjunctiva and lids normal, oropharynx clear. Neck: Supple, no elevated JVP or carotid bruits, no thyromegaly. Lungs: Clear to auscultation, nonlabored breathing at rest. Cardiac: Regular rate and rhythm, no S3, soft systolic murmur. Abdomen: Soft, nontender, bowel sounds present. Extremities: Mild ankle edema, distal pulses 2+. Skin: Warm and dry. Musculoskeletal: No kyphosis. Neuropsychiatric: Alert and oriented x3, affect grossly appropriate.  ECG: I personally reviewed the tracing from 10/31/2017 which showed an atrial paced rhythm with left anterior fascicular block, poor R wave progression, lead motion artifact.  Recent Labwork: 11/10/2017: BUN 22; Creat 0.82; Hemoglobin 10.7; Platelets 137; Potassium 3.9; Sodium 143   Other Studies Reviewed Today:  Echocardiogram 07/01/2015: Study Conclusions  - Left ventricle: The cavity size was normal. Wall thickness was increased in a pattern of moderate LVH. Systolic function was normal. The estimated ejection fraction was in the range of 60% to 65%. Wall motion was normal; there were no regional wall motion abnormalities. Features are consistent with a pseudonormal left ventricular filling pattern, with concomitant abnormal relaxation and increased filling pressure (grade 2 diastolic dysfunction). - Aortic valve: Mildly calcified annulus. Trileaflet; mildly thickened leaflets. Valve area (VTI): 1.68 cm^2. Valve area (Vmax): 1.56 cm^2. - Left atrium: The atrium was severely dilated. - Right atrium: The atrium was mildly dilated. - Atrial septum: No defect or patent foramen ovale was identified. - Pulmonary arteries: Systolic pressure was moderately increased. PA peak pressure: 44 mm Hg (S). - Technically adequate study.  Assessment and Plan:  1.  Paroxysmal atrial fibrillation.  No active palpitations at  this time.  Continue Coreg and Xarelto.  Follow-up CBC and BMET with Dr. Otilio MiuBarrino in February.  2.  CAD status post DES to the LAD, no angina symptoms.  Continue with conservative management and routine follow-up.  3.  Medtronic pacemaker in place, keep follow-up with Dr. Ladona Ridgelaylor.  Current medicines were reviewed with the patient today.  Disposition: Follow-up in 6 months.  Signed, Jonelle SidleSamuel G. Jabril Pursell, MD, [facility] 05/02/2018 12:48 PM    [facility] [address], [place], [place] [address] Phone: [phone]; Fax: [phone]

## 2018-05-02 ENCOUNTER — Encounter: Payer: Self-pay | Admitting: Cardiology

## 2018-05-02 ENCOUNTER — Ambulatory Visit: Payer: Medicare Other | Admitting: Cardiology

## 2018-05-02 VITALS — BP 128/78 | HR 69 | Ht 61.0 in | Wt 202.0 lb

## 2018-05-02 DIAGNOSIS — I480 Paroxysmal atrial fibrillation: Secondary | ICD-10-CM | POA: Diagnosis not present

## 2018-05-02 DIAGNOSIS — Z950 Presence of cardiac pacemaker: Secondary | ICD-10-CM

## 2018-05-02 DIAGNOSIS — I2510 Atherosclerotic heart disease of native coronary artery without angina pectoris: Secondary | ICD-10-CM

## 2018-05-02 NOTE — Patient Instructions (Addendum)

## 2018-06-23 ENCOUNTER — Encounter: Payer: Self-pay | Admitting: Internal Medicine

## 2018-06-23 ENCOUNTER — Ambulatory Visit: Payer: Medicare Other | Admitting: Internal Medicine

## 2018-06-23 DIAGNOSIS — I480 Paroxysmal atrial fibrillation: Secondary | ICD-10-CM

## 2018-06-23 DIAGNOSIS — Z950 Presence of cardiac pacemaker: Secondary | ICD-10-CM

## 2018-06-23 NOTE — Progress Notes (Signed)
HPI Brianna Chandler returns today for followup. She is a very pleasant49 year old woman with morbid obesity, coronary artery disease status post stenting in 2010, with symptomatic bradycardia, status post permanent pacemaker insertion. In the interim, she has been stable. She denies any falls or bleeding. No palpitations. She has not been in the hospital.  Appetite is good No Known Allergies   Current Outpatient Medications  Medication Sig Dispense Refill  . amLODipine (NORVASC) 5 MG tablet Take 1 tablet (5 mg total) by mouth every morning. 30 tablet 6  . atorvastatin (LIPITOR) 40 MG tablet Take 1 tablet (40 mg total) by mouth at bedtime. 30 tablet 3  . carvedilol (COREG) 12.5 MG tablet TAKE 1/2 TABLET BY MOUTH TWICE DAILY 30 tablet 5  . furosemide (LASIX) 40 MG tablet Take 1 tablet (40 mg total) by mouth 2 (two) times daily. Take one extra tablet daily prn for excess fluid 60 tablet 1  . isosorbide mononitrate (IMDUR) 30 MG 24 hr tablet TAKE ONE TABLET BY MOUTH EVERY EVENING 90 tablet 3  . losartan (COZAAR) 50 MG tablet Take 50 mg by mouth every morning.     . metFORMIN (GLUCOPHAGE) 500 MG tablet Take 500 mg by mouth 2 (two) times daily with a meal.    . Multiple Vitamin (MULTIVITAMIN) tablet Take 1 tablet by mouth daily.      . nitroGLYCERIN (NITROSTAT) 0.4 MG SL tablet Place 0.4 mg under the tongue every 5 (five) minutes x 3 doses as needed for chest pain (MAX 3 TABLETS).     Marland Kitchen oxybutynin (DITROPAN-XL) 5 MG 24 hr tablet Take 5 mg by mouth daily.    . Rivaroxaban (XARELTO) 15 MG TABS tablet Take 1 tablet (15 mg total) by mouth every morning. 30 tablet 6   No current facility-administered medications for this visit.      Past Medical History:  Diagnosis Date  . Chronic diastolic CHF (congestive heart failure) (HCC)    LVEF 60-65%, grade 2 diastolic dysfunction, PASP  . CKD (chronic kidney disease) stage 2, GFR 60-89 ml/min   . Coronary atherosclerosis of native coronary  artery February 05, 2009   NSTEMI s/p DES prox LAD  . Essential hypertension, benign   . Obesity   . Osteoarthritis   . PAF (paroxysmal atrial fibrillation) (HCC)   . Symptomatic bradycardia    12/2011 s/p MDT Sensia DC PPM, [id number] H  . Syncope   . Type 2 diabetes mellitus (HCC)     ROS:   All systems reviewed and negative except as noted in the HPI.   Past Surgical History:  Procedure Laterality Date  . CATARACT EXTRACTION    . PACEMAKER INSERTION    . PERMANENT PACEMAKER INSERTION Left 01/12/2012   Procedure: PERMANENT PACEMAKER INSERTION;  Surgeon: Marinus Maw, MD;  Location: [facility] CATH LAB;  Service: Cardiovascular;  Laterality: Left;  . SLT LASER APPLICATION Left 11/13/2012   Procedure: SLT LASER APPLICATION;  Surgeon: Susa Simmonds, MD;  Location: [facility] ORS;  Service: Ophthalmology;  Laterality: Left;  . TOTAL ABDOMINAL HYSTERECTOMY       Family History  Problem Relation Age of Onset  . Diabetes Other   . Hypertension Other   . Coronary artery disease Other      Social History   Socioeconomic History  . Marital status: Widowed    Spouse name: Not on file  . Number of children: Not on file  . Years of education: Not on file  .  Highest education level: Not on file  Occupational History  . Occupation: Retired  Engineer, production  . Financial resource strain: Not on file  . Food insecurity:    Worry: Not on file    Inability: Not on file  . Transportation needs:    Medical: Not on file    Non-medical: Not on file  Tobacco Use  . Smoking status: Never Smoker  . Smokeless tobacco: Former Neurosurgeon    Types: Snuff  Substance and Sexual Activity  . Alcohol use: No    Alcohol/week: 0.0 standard drinks  . Drug use: No  . Sexual activity: Not on file  Lifestyle  . Physical activity:    Days per week: Not on file    Minutes per session: Not on file  . Stress: Not on file  Relationships  . Social connections:    Talks on phone: Not on file    Gets together: Not on  file    Attends religious service: Not on file    Active member of club or organization: Not on file    Attends meetings of clubs or organizations: Not on file    Relationship status: Not on file  . Intimate partner violence:    Fear of current or ex partner: Not on file    Emotionally abused: Not on file    Physically abused: Not on file    Forced sexual activity: Not on file  Other Topics Concern  . Not on file  Social History Narrative  . Not on file     BP 130/70 (BP Location: Left Arm)   Pulse 68   Ht 5\' 2"  (1.575 m)   Wt 200 lb (90.7 kg)   SpO2 95%   BMI 36.58 kg/m   Physical Exam:  Well appearing NAD HEENT: Unremarkable Neck:  No JVD, no thyromegally Lymphatics:  No adenopathy Back:  No CVA tenderness Lungs:  Clear with no wheezes HEART:  Regular rate rhythm, no murmurs, no rubs, no clicks Abd:  soft, positive bowel sounds, no organomegally, no rebound, no guarding Ext:  2 plus pulses, no edema, no cyanosis, no clubbing Skin:  No rashes no nodules Neuro:  CN II through XII intact, motor grossly intact  DEVICE  Normal device function.  See PaceArt for details.   Assess/Plan: 1. Sinus node dysfunction - her symptoms are well controlled. No change in meds. She is asymptomatic after PPM insertion. 2. PPM - her medtronic DDD PM is working normally. We will recheck in several months. 3. HTN - her blood pressure is well controlled. She will continue her current meds.  Leonia Reeves.D.

## 2018-06-23 NOTE — Patient Instructions (Signed)
Medication Instructions:  Your physician recommends that you continue on your current medications as directed. Please refer to the Current Medication list given to you today.  If you need a refill on your cardiac medications before your next appointment, please call your pharmacy.   Lab work: NONE  If you have labs (blood work) drawn today and your tests are completely normal, you will receive your results only by: . MyChart Message (if you have MyChart) OR . A paper copy in the mail If you have any lab test that is abnormal or we need to change your treatment, we will call you to review the results.  Testing/Procedures: NONE   Follow-Up: At [facility], you and your health needs are our priority.  As part of our continuing mission to provide you with exceptional heart care, we have created designated Provider Care Teams.  These Care Teams include your primary Cardiologist (physician) and Advanced Practice Providers (APPs -  Physician Assistants and Nurse Practitioners) who all work together to provide you with the care you need, when you need it. You will need a follow up appointment in 1 years.  Please call our office 2 months in advance to schedule this appointment.  You may see None or one of the following Advanced Practice Providers on your designated Care Team:   Amber Seiler, NP . Renee Ursuy, PA-C  Any Other Special Instructions Will Be Listed Below (If Applicable). Thank you for choosing [facility]!     

## 2018-06-28 LAB — CUP PACEART INCLINIC DEVICE CHECK
Date Time Interrogation Session: 20200304162518
Implantable Lead Implant Date: 20130918
Implantable Lead Location: 753859
Implantable Lead Location: 753860
Implantable Lead Model: 5076
MDC IDC LEAD IMPLANT DT: 20130918
MDC IDC PG IMPLANT DT: 20130918

## 2018-08-09 ENCOUNTER — Other Ambulatory Visit: Payer: Self-pay | Admitting: Cardiology

## 2018-09-14 ENCOUNTER — Telehealth: Payer: Self-pay | Admitting: Internal Medicine

## 2018-09-14 NOTE — Telephone Encounter (Signed)
Spoke with Dorene Sorrow (DPR). Advised schedule for satellite office Device Clinic is not available yet. He reports pt has a lot of appointments in August already, asked if it would be okay to schedule an appointment in September. Advised this should be fine, estimated remaining battery longevity as of 06/23/18 was 4.5 years (range: 3-6 years). Dorene Sorrow verbalizes understanding, agrees to call back in August to schedule f/u. He thanked me for my call and denies questions at this time.

## 2018-09-14 NOTE — Telephone Encounter (Signed)
Calling to schedule device check

## 2018-10-12 ENCOUNTER — Other Ambulatory Visit: Payer: Self-pay | Admitting: Cardiology

## 2018-11-28 ENCOUNTER — Telehealth: Payer: Self-pay | Admitting: Cardiology

## 2018-11-28 NOTE — Telephone Encounter (Signed)
Virtual Visit Pre-Appointment Phone Call  "(Name), I am calling you today to discuss your upcoming appointment. We are currently trying to limit exposure to the virus that causes COVID-19 by seeing patients at home rather than in the office."  1. "What is the BEST phone number to call the day of the visit?" - include this in appointment notes  2. Do you have or have access to (through a family member/friend) a smartphone with video capability that we can use for your visit?" a. If yes - list this number in appt notes as cell (if different from BEST phone #) and list the appointment type as a VIDEO visit in appointment notes b. If no - list the appointment type as a PHONE visit in appointment notes  3. Confirm consent - "In the setting of the current Covid19 crisis, you are scheduled for a (phone or video) visit with your provider on (date) at (time).  Just as we do with many in-office visits, in order for you to participate in this visit, we must obtain consent.  If you'd like, I can send this to your mychart (if signed up) or email for you to review.  Otherwise, I can obtain your verbal consent now.  All virtual visits are billed to your insurance company just like a normal visit would be.  By agreeing to a virtual visit, we'd like you to understand that the technology does not allow for your provider to perform an examination, and thus may limit your provider's ability to fully assess your condition. If your provider identifies any concerns that need to be evaluated in person, we will make arrangements to do so.  Finally, though the technology is pretty good, we cannot assure that it will always work on either your or our end, and in the setting of a video visit, we may have to convert it to a phone-only visit.  In either situation, we cannot ensure that we have a secure connection.  Are you willing to proceed?" STAFF: Did the patient verbally acknowledge consent to telehealth visit? Document  YES/NO here: yes  4. Advise patient to be prepared - "Two hours prior to your appointment, go ahead and check your blood pressure, pulse, oxygen saturation, and your weight (if you have the equipment to check those) and write them all down. When your visit starts, your provider will ask you for this information. If you have an Apple Watch or Kardia device, please plan to have heart rate information ready on the day of your appointment. Please have a pen and paper handy nearby the day of the visit as well."  5. Give patient instructions for MyChart download to smartphone OR Doximity/Doxy.me as below if video visit (depending on what platform provider is using)  6. Inform patient they will receive a phone call 15 minutes prior to their appointment time (may be from unknown caller ID) so they should be prepared to answer    TELEPHONE CALL NOTE  Brianna Chandler has been deemed a candidate for a follow-up tele-health visit to limit community exposure during the Covid-19 pandemic. I spoke with the patient via phone to ensure availability of phone/video source, confirm preferred email & phone number, and discuss instructions and expectations.  I reminded Brianna Chandler to be prepared with any vital sign and/or heart rhythm information that could potentially be obtained via home monitoring, at the time of her visit. I reminded Brianna Chandler to expect a phone call prior to  her visit.  Weston Anna 11/28/2018 10:21 AM   INSTRUCTIONS FOR DOWNLOADING THE MYCHART APP TO SMARTPHONE  - The patient must first make sure to have activated MyChart and know their login information - If Apple, go to [employer] and type in MyChart in the search bar and download the app. If Android, ask patient to go to [employer] and type in [place] in the search bar and download the app. The app is free but as with any other app downloads, their phone may require them to verify saved payment information or Apple/Android  password.  - The patient will need to then log into the app with their MyChart username and password, and select [facility] as their healthcare provider to link the account. When it is time for your visit, go to the MyChart app, find appointments, and click Begin Video Visit. Be sure to Select Allow for your device to access the Microphone and Camera for your visit. You will then be connected, and your provider will be with you shortly.  **If they have any issues connecting, or need assistance please contact MyChart service desk [phone])**  **If using a computer, in order to ensure the best quality for their visit they will need to use either of the following Internet Browsers: [employer], or Google Chrome**  IF USING DOXIMITY or DOXY.ME - The patient will receive a link just prior to their visit by text.     FULL LENGTH CONSENT FOR TELE-HEALTH VISIT   I hereby voluntarily request, consent and authorize [facility] and its employed or contracted physicians, physician assistants, nurse practitioners or other licensed health care professionals (the Practitioner), to provide me with telemedicine health care services (the Services") as deemed necessary by the treating Practitioner. I acknowledge and consent to receive the Services by the Practitioner via telemedicine. I understand that the telemedicine visit will involve communicating with the Practitioner through live audiovisual communication technology and the disclosure of certain medical information by electronic transmission. I acknowledge that I have been given the opportunity to request an in-person assessment or other available alternative prior to the telemedicine visit and am voluntarily participating in the telemedicine visit.  I understand that I have the right to withhold or withdraw my consent to the use of telemedicine in the course of my care at any time, without affecting my right to future care or treatment,  and that the Practitioner or I may terminate the telemedicine visit at any time. I understand that I have the right to inspect all information obtained and/or recorded in the course of the telemedicine visit and may receive copies of available information for a reasonable fee.  I understand that some of the potential risks of receiving the Services via telemedicine include:   Delay or interruption in medical evaluation due to technological equipment failure or disruption;  Information transmitted may not be sufficient (e.g. poor resolution of images) to allow for appropriate medical decision making by the Practitioner; and/or   In rare instances, security protocols could fail, causing a breach of personal health information.  Furthermore, I acknowledge that it is my responsibility to provide information about my medical history, conditions and care that is complete and accurate to the best of my ability. I acknowledge that Practitioner's advice, recommendations, and/or decision may be based on factors not within their control, such as incomplete or inaccurate data provided by me or distortions of diagnostic images or specimens that may result from electronic transmissions. I  understand that the practice of medicine is not an exact science and that Practitioner makes no warranties or guarantees regarding treatment outcomes. I acknowledge that I will receive a copy of this consent concurrently upon execution via email to the email address I last provided but may also request a printed copy by calling the office of [facility].    I understand that my insurance will be billed for this visit.   I have read or had this consent read to me.  I understand the contents of this consent, which adequately explains the benefits and risks of the Services being provided via telemedicine.   I have been provided ample opportunity to ask questions regarding this consent and the Services and have had my questions  answered to my satisfaction.  I give my informed consent for the services to be provided through the use of telemedicine in my medical care  By participating in this telemedicine visit I agree to the above.

## 2018-11-30 ENCOUNTER — Telehealth (INDEPENDENT_AMBULATORY_CARE_PROVIDER_SITE_OTHER): Payer: Medicare Other | Admitting: Cardiology

## 2018-11-30 ENCOUNTER — Encounter: Payer: Self-pay | Admitting: Cardiology

## 2018-11-30 VITALS — BP 157/66 | HR 61 | Ht 62.0 in | Wt 152.0 lb

## 2018-11-30 DIAGNOSIS — I2510 Atherosclerotic heart disease of native coronary artery without angina pectoris: Secondary | ICD-10-CM

## 2018-11-30 DIAGNOSIS — E782 Mixed hyperlipidemia: Secondary | ICD-10-CM | POA: Diagnosis not present

## 2018-11-30 DIAGNOSIS — Z950 Presence of cardiac pacemaker: Secondary | ICD-10-CM | POA: Diagnosis not present

## 2018-11-30 DIAGNOSIS — I480 Paroxysmal atrial fibrillation: Secondary | ICD-10-CM

## 2018-11-30 DIAGNOSIS — Z79899 Other long term (current) drug therapy: Secondary | ICD-10-CM

## 2018-11-30 DIAGNOSIS — I5032 Chronic diastolic (congestive) heart failure: Secondary | ICD-10-CM

## 2018-11-30 NOTE — Progress Notes (Signed)
Virtual Visit via Telephone Note   This visit type was conducted due to national recommendations for restrictions regarding the COVID-19 Pandemic (e.g. social distancing) in an effort to limit this patient's exposure and mitigate transmission in our community.  Due to her co-morbid illnesses, this patient is at least at moderate risk for complications without adequate follow up.  This format is felt to be most appropriate for this patient at this time.  The patient did not have access to video technology/had technical difficulties with video requiring transitioning to audio format only (telephone).  All issues noted in this document were discussed and addressed.  No physical exam could be performed with this format.  Please refer to the patient's chart for her  consent to telehealth for [facility].   Date:  11/30/2018   ID:  Brianna Chandler, DOB 10/22/1923, MRN [id number]  Patient Location: Home Provider Location: Office  PCP:  Sandi Mealy, MD  Cardiologist:  Rozann Lesches, MD Electrophysiologist:  None   Evaluation Performed:  Follow-Up Visit  Chief Complaint:   Cardiac follow-up  History of Present Illness:    Brianna Chandler is a 85 y.o. female last seen in January.  She did not have video access and we spoke by phone today.  I also spoke with her son.  She does not report any specific symptoms, no chest pain or palpitations, no shortness of breath at rest.  She is very sedentary.  She sees Dr. Lovena Le in the device clinic, Medtronic pacemaker in place.  We discussed follow-up lab work on [place].  She does not report any obvious bleeding problems.  I reviewed her remaining medications which are outlined below.  The patient does not have symptoms concerning for COVID-19 infection (fever, chills, cough, or new shortness of breath).    Past Medical History:  Diagnosis Date  . Chronic diastolic CHF (congestive heart failure) (HCC)    LVEF 95-18%, grade 2 diastolic  dysfunction, PASP 71mmHg  . CKD (chronic kidney disease) stage 2, GFR 60-89 ml/min   . Coronary atherosclerosis of native coronary artery February 05, 2009   NSTEMI s/p DES prox LAD  . Essential hypertension, benign   . Obesity   . Osteoarthritis   . PAF (paroxysmal atrial fibrillation) ([facility])   . Symptomatic bradycardia    12/2011 s/p MDT Sens[place] DC PPM, [id number] H  . Syncope   . Type 2 diabetes mellitus ([facility])    Past Surgical History:  Procedure Laterality Date  . CATARACT EXTRACTION    . PACEMAKER INSERTION    . PERMANENT PACEMAKER INSERTION Left 01/12/2012   Procedure: PERMANENT PACEMAKER INSERTION;  Surgeon: Evans Lance, MD;  Location: [facility] CATH LAB;  Service: Cardiovascular;  Laterality: Left;  . SLT LASER APPLICATION Left 1/60/1093   Procedure: SLT LASER APPLICATION;  Surgeon: Williams Che, MD;  Location: [facility] ORS;  Service: Ophthalmology;  Laterality: Left;  . TOTAL ABDOMINAL HYSTERECTOMY       Current Meds  Medication Sig  . amLODipine (NORVASC) 5 MG tablet TAKE 1 TABLET BY MOUTH EVERY MORNING  . atorvastatin (LIPITOR) 40 MG tablet Take 1 tablet (40 mg total) by mouth at bedtime.  . carvedilol (COREG) 12.5 MG tablet TAKE 1/2 TABLET BY MOUTH TWICE DAILY  . dorzolamide-timolol (COSOPT) 22.3-6.8 MG/ML ophthalmic solution Place 1 drop into the left eye 2 (two) times daily.  . furosemide (LASIX) 40 MG tablet Take 1 tablet (40 mg total) by mouth 2 (two) times daily. Take one extra tablet  daily prn for excess fluid  . isosorbide mononitrate (IMDUR) 30 MG 24 hr tablet TAKE ONE TABLET BY MOUTH EVERY EVENING  . latanoprost (XALATAN) 0.005 % ophthalmic solution Place 1 drop into the left eye daily.  Marland Kitchen. losartan (COZAAR) 50 MG tablet Take 50 mg by mouth every morning.   . metFORMIN (GLUCOPHAGE) 500 MG tablet Take 500 mg by mouth 2 (two) times daily with a meal.  . Multiple Vitamin (MULTIVITAMIN) tablet Take 1 tablet by mouth daily.    . nitroGLYCERIN (NITROSTAT) 0.4 MG SL tablet Place  0.4 mg under the tongue every 5 (five) minutes x 3 doses as needed for chest pain (MAX 3 TABLETS).   Marland Kitchen. oxybutynin (DITROPAN-XL) 5 MG 24 hr tablet Take 5 mg by mouth daily.  Carlena Hurl. XARELTO 15 MG TABS tablet TAKE 1 TABLET BY MOUTH EVERY MORNING     Allergies:   Patient has no known allergies.   Social History   Tobacco Use  . Smoking status: Never Smoker  . Smokeless tobacco: Former NeurosurgeonUser    Types: Snuff  Substance Use Topics  . Alcohol use: No    Alcohol/week: 0.0 standard drinks  . Drug use: No     Family Hx: The patient's family history includes Coronary artery disease in an other family member; Diabetes in an other family member; Hypertension in an other family member.  ROS:   Please see the history of present illness. All other systems reviewed and are negative.   Prior CV studies:   The following studies were reviewed today:  Echocardiogram 07/01/2015: Study Conclusions  - Left ventricle: The cavity size was normal. Wall thickness was increased in a pattern of moderate LVH. Systolic function was normal. The estimated ejection fraction was in the range of 60% to 65%. Wall motion was normal; there were no regional wall motion abnormalities. Features are consistent with a pseudonormal left ventricular filling pattern, with concomitant abnormal relaxation and increased filling pressure (grade 2 diastolic dysfunction). - Aortic valve: Mildly calcified annulus. Trileaflet; mildly thickened leaflets. Valve area (VTI): 1.68 cm^2. Valve area (Vmax): 1.56 cm^2. - Left atrium: The atrium was severely dilated. - Right atrium: The atrium was mildly dilated. - Atrial septum: No defect or patent foramen ovale was identified. - Pulmonary arteries: Systolic pressure was moderately increased. PA peak pressure: 44 mm Hg (S). - Technically adequate study.  Labs/Other Tests and Data Reviewed:    EKG:  An ECG dated 10/31/2017 was personally reviewed today and  demonstrated:  Atrial paced rhythm with left anterior fascicular block, poor R wave progression, lead motion artifact.  Recent Labs:  February 2020: Hemoglobin A1c 6%  Wt Readings from Last 3 Encounters:  11/30/18 152 lb (68.9 kg)  06/23/18 200 lb (90.7 kg)  05/02/18 202 lb (91.6 kg)     Objective:    Vital Signs:  BP (!) 157/66   Pulse 61   Ht 5\' 2"  (1.575 m)   Wt 152 lb (68.9 kg)   SpO2 98%   BMI 27.80 kg/m    Patient spoke in full sentences, not short of breath. No audible wheezing or coughing. Speech pattern normal.  ASSESSMENT & PLAN:    1.  Paroxysmal atrial fibrillation.  She reports no new symptoms and we will plan to continue Coreg for heart rate control and Xarelto for stroke prophylaxis.  CBC and BMET are arranged.  2.  Medtronic pacemaker in place.  Continue to follow with Dr. Ladona Ridgelaylor.  3.  CAD status post DES to  the LAD.  We are following conservatively at this point in the absence of angina symptoms.  She is not on aspirin given use of Xarelto.  Continue statin therapy.  4.  Mixed hyperlipidemia, on Lipitor.  Keep follow-up with PCP.  COVID-19 Education: The signs and symptoms of COVID-19 were discussed with the patient and how to seek care for testing (follow up with PCP or arrange E-visit).  The importance of social distancing was discussed today.  Time:   Today, I have spent 6 minutes with the patient with telehealth technology discussing the above problems.     Medication Adjustments/Labs and Tests Ordered: Current medicines are reviewed at length with the patient today.  Concerns regarding medicines are outlined above.   Tests Ordered: Orders Placed This Encounter  Procedures  . Basic metabolic panel  . CBC    Medication Changes: No orders of the defined types were placed in this encounter.   Follow Up:  In Person 6 months in the [place] office.  Signed, Nona DellSamuel , MD  11/30/2018 2:00 PM    [facility]

## 2018-11-30 NOTE — Patient Instructions (Addendum)
Medication Instructions:   Your physician recommends that you continue on your current medications as directed. Please refer to the Current Medication list given to you today.  Labwork:  Your physician recommends that you return for lab work in: as soon as possible to check your BMET and CBC. You do not have to fast for this lab work. You can have this done at [facility] or [facility] in [place]. Your lab orders have been faxed.  Testing/Procedures:  NONE  Follow-Up:  Your physician recommends that you schedule a follow-up appointment in: 6 months. You will receive a reminder letter in the mail in about 4 months reminding you to call and schedule your appointment. If you don't receive this letter, please contact our office.  Any Other Special Instructions Will Be Listed Below (If Applicable).  If you need a refill on your cardiac medications before your next appointment, please call your pharmacy.

## 2018-12-01 ENCOUNTER — Telehealth: Payer: Self-pay | Admitting: Cardiology

## 2018-12-01 NOTE — Telephone Encounter (Signed)
Informed Sonia Side that lab orders faxed to [facility]. Says he will take her Thursday next week 12/07/2018.

## 2018-12-01 NOTE — Telephone Encounter (Signed)
Patient returning call.

## 2018-12-07 ENCOUNTER — Other Ambulatory Visit: Payer: Self-pay

## 2018-12-07 ENCOUNTER — Other Ambulatory Visit (HOSPITAL_COMMUNITY)
Admission: RE | Admit: 2018-12-07 | Discharge: 2018-12-07 | Disposition: A | Discharge status: Home / Self Care | Payer: Medicare Other | Source: Ambulatory Visit | Attending: Cardiology | Admitting: Cardiology

## 2018-12-07 DIAGNOSIS — I480 Paroxysmal atrial fibrillation: Secondary | ICD-10-CM | POA: Insufficient documentation

## 2018-12-07 DIAGNOSIS — Z79899 Other long term (current) drug therapy: Secondary | ICD-10-CM | POA: Insufficient documentation

## 2018-12-07 LAB — BASIC METABOLIC PANEL
Anion gap: 9 (ref 5–15)
BUN: 34 mg/dL — ABNORMAL HIGH (ref 8–23)
CO2: 26 mmol/L (ref 22–32)
Calcium: 9.7 mg/dL (ref 8.9–10.3)
Chloride: 107 mmol/L (ref 98–111)
Creatinine, Ser: 0.87 mg/dL (ref 0.44–1.00)
GFR calc Af Amer: >60 mL/min (ref >60)
GFR calc non Af Amer: 58 mL/min — ABNORMAL LOW (ref >60)
Glucose, Bld: 172 mg/dL — ABNORMAL HIGH (ref 70–99)
Potassium: 3.8 mmol/L (ref 3.5–5.1)
Sodium: 142 mmol/L (ref 135–145)

## 2018-12-07 LAB — CBC
HCT: 34.3 % — ABNORMAL LOW (ref 36.0–46.0)
Hemoglobin: 10.4 g/dL — ABNORMAL LOW (ref 12.0–15.0)
MCH: 28 pg (ref 26.0–34.0)
MCHC: 30.3 g/dL (ref 30.0–36.0)
MCV: 92.2 fL (ref 80.0–100.0)
Platelets: 140 10*3/uL — ABNORMAL LOW (ref 150–400)
RBC: 3.72 MIL/uL — ABNORMAL LOW (ref 3.87–5.11)
RDW: 12.8 % (ref 11.5–15.5)
WBC: 12.1 10*3/uL — ABNORMAL HIGH (ref 4.0–10.5)
nRBC: 0 % (ref 0.0–0.2)

## 2018-12-08 ENCOUNTER — Telehealth: Payer: Self-pay | Admitting: *Deleted

## 2018-12-08 NOTE — Telephone Encounter (Signed)
-----   Message from Erma Heritage, [place] sent at 12/08/2018  7:32 AM EDT ----- Covering for Dr. Domenic Polite - Please let the patient know her electrolytes and kidney function remain stable. Creatinine clearance is less than 50 when calculated based off her her most recent labs and weight, therefore she is on the correct dose of Xarelto at this time. She does have anemia with hemoglobin at 10.4 which is overall similar to her prior ranges for the past 3 years. Continue current medication regimen at this time. Please forward a copy of results to Sandi Mealy, MD

## 2018-12-08 NOTE — Telephone Encounter (Signed)
Son Sonia Side informed. Copy sent to PCP

## 2019-01-16 ENCOUNTER — Ambulatory Visit (INDEPENDENT_AMBULATORY_CARE_PROVIDER_SITE_OTHER): Payer: Medicare Other | Admitting: *Deleted

## 2019-01-16 ENCOUNTER — Other Ambulatory Visit: Payer: Self-pay

## 2019-01-16 DIAGNOSIS — I480 Paroxysmal atrial fibrillation: Secondary | ICD-10-CM | POA: Diagnosis not present

## 2019-01-16 LAB — CUP PACEART INCLINIC DEVICE CHECK
Date Time Interrogation Session: 20200922113414
Implantable Lead Implant Date: 20130918
Implantable Lead Implant Date: 20130918
Implantable Lead Location: 753859
Implantable Lead Location: 753860
Implantable Lead Model: 5076
Implantable Lead Model: 5076
Implantable Pulse Generator Implant Date: 20130918

## 2019-01-16 NOTE — Progress Notes (Signed)
Device check in clinic. See PaceArt report.

## 2019-01-30 ENCOUNTER — Other Ambulatory Visit: Payer: Self-pay

## 2019-02-07 ENCOUNTER — Other Ambulatory Visit: Payer: Self-pay | Admitting: Cardiology

## 2019-05-13 ENCOUNTER — Other Ambulatory Visit: Payer: Self-pay | Admitting: Cardiology

## 2019-05-30 ENCOUNTER — Encounter: Payer: Self-pay | Admitting: Cardiology

## 2019-05-30 NOTE — Progress Notes (Signed)
Virtual Visit via Telephone Note   This visit type was conducted due to national recommendations for restrictions regarding the COVID-19 Pandemic (e.g. social distancing) in an effort to limit this patient's exposure and mitigate transmission in our community.  Due to her co-morbid illnesses, this patient is at least at moderate risk for complications without adequate follow up.  This format is felt to be most appropriate for this patient at this time.  The patient did not have access to video technology/had technical difficulties with video requiring transitioning to audio format only (telephone).  All issues noted in this document were discussed and addressed.  No physical exam could be performed with this format.  Please refer to the patient's chart for her  consent to telehealth for [facility].  Date:  05/31/2019   ID:  Brianna Chandler, DOB [birth year], MRN [id number]  Patient Location: Home Provider Location: Office  PCP:  Sandi Mealy, MD  Cardiologist:  Rozann Lesches, MD Electrophysiologist:  None   Evaluation Performed:  Follow-Up Visit  Chief Complaint:  Cardiac follow-up  History of Present Illness:    Brianna Chandler is a 86 y.o. female last assessed via telehealth encounter in August 2020.  We spoke by phone today, also talked with her son.  He tells me that she has been skipping her second Lasix dose sometimes so that she does not urinate as much at nighttime.  I did talk with her about moving a dose into the afternoon so that this is less of an issue.  She follows with Dr. Lovena Le in the device clinic, Medtronic pacemaker in place. Recent device check showed normal function.  AF burden 4.6%.  She remains on Xarelto for stroke prophylaxis.  No reported bleeding problems.  I reviewed her last lab work as detailed below.  We also went over the remainder of her medications which are listed below.  The patient does not have symptoms concerning for COVID-19 infection  (fever, chills, cough, or new shortness of breath).    Past Medical History:  Diagnosis Date   Chronic diastolic CHF (congestive heart failure) (HCC)    LVEF 45-80%, grade 2 diastolic dysfunction, PASP 22mmHg   CKD (chronic kidney disease) stage 2, GFR 60-89 ml/min    Coronary atherosclerosis of native coronary artery    NSTEMI s/p DES prox LAD October 2010   Essential hypertension    Obesity    Osteoarthritis    PAF (paroxysmal atrial fibrillation) (HCC)    Symptomatic bradycardia    12/2011 s/p MDT Sensia DC PPM, [id number]   Syncope    Type 2 diabetes mellitus ([place])    Past Surgical History:  Procedure Laterality Date   CATARACT EXTRACTION     PACEMAKER INSERTION     PERMANENT PACEMAKER INSERTION Left 01/12/2012   Procedure: PERMANENT PACEMAKER INSERTION;  Surgeon: Evans Lance, MD;  Location: [facility] CATH LAB;  Service: Cardiovascular;  Laterality: Left;   SLT LASER APPLICATION Left 5/39/7673   Procedure: SLT LASER APPLICATION;  Surgeon: Williams Che, MD;  Location: [facility];  Service: Ophthalmology;  Laterality: Left;   TOTAL ABDOMINAL HYSTERECTOMY       Current Meds  Medication Sig   amLODipine (NORVASC) 5 MG tablet TAKE 1 TABLET BY MOUTH EVERY MORNING   atorvastatin (LIPITOR) 40 MG tablet Take 1 tablet (40 mg total) by mouth at bedtime.   carvedilol (COREG) 12.5 MG tablet TAKE 1/2 TABLET BY MOUTH TWICE DAILY   diclofenac Sodium (VOLTAREN) 1 % GEL  Apply 1 application topically 2 (two) times daily as needed.   dorzolamide-timolol (COSOPT) 22.3-6.8 MG/ML ophthalmic solution Place 1 drop into the left eye 2 (two) times daily.   furosemide (LASIX) 40 MG tablet Take 1 tablet (40 mg total) by mouth 2 (two) times daily. Take one extra tablet daily prn for excess fluid   isosorbide mononitrate (IMDUR) 30 MG 24 hr tablet TAKE 1 TABLET BY MOUTH EVERY EVENING   latanoprost (XALATAN) 0.005 % ophthalmic solution Place 1 drop into the left eye daily.   losartan  (COZAAR) 50 MG tablet Take 1 tablet by mouth daily.   metFORMIN (GLUCOPHAGE) 500 MG tablet Take 500 mg by mouth 2 (two) times daily with a meal.   Multiple Vitamin (MULTIVITAMIN) tablet Take 1 tablet by mouth daily.     nitroGLYCERIN (NITROSTAT) 0.4 MG SL tablet Place 0.4 mg under the tongue every 5 (five) minutes x 3 doses as needed for chest pain (MAX 3 TABLETS).    oxybutynin (DITROPAN-XL) 5 MG 24 hr tablet Take 5 mg by mouth daily.   XARELTO 15 MG TABS tablet TAKE 1 TABLET BY MOUTH EVERY MORNING     Allergies:   Patient has no known allergies.   Social History   Tobacco Use   Smoking status: Never Smoker   Smokeless tobacco: Former Neurosurgeon    Types: Snuff  Substance Use Topics   Alcohol use: No    Alcohol/week: 0.0 standard drinks   Drug use: No     Family Hx: The patient's family history includes Coronary artery disease in an other family member; Diabetes in an other family member; Hypertension in an other family member.  ROS:   Please see the history of present illness. All other systems reviewed and are negative.   Prior CV studies:   The following studies were reviewed today:  Echocardiogram 07/01/2015: Study Conclusions  - Left ventricle: The cavity size was normal. Wall thickness was increased in a pattern of moderate LVH. Systolic function was normal. The estimated ejection fraction was in the range of 60% to 65%. Wall motion was normal; there were no regional wall motion abnormalities. Features are consistent with a pseudonormal left ventricular filling pattern, with concomitant abnormal relaxation and increased filling pressure (grade 2 diastolic dysfunction). - Aortic valve: Mildly calcified annulus. Trileaflet; mildly thickened leaflets. Valve area (VTI): 1.68 cm^2. Valve area (Vmax): 1.56 cm^2. - Left atrium: The atrium was severely dilated. - Right atrium: The atrium was mildly dilated. - Atrial septum: No defect or patent  foramen ovale was identified. - Pulmonary arteries: Systolic pressure was moderately increased. PA peak pressure: 44 mm Hg (S). - Technically adequate study.  Labs/Other Tests and Data Reviewed:    EKG:  An ECG dated 10/31/2017 was personally reviewed today and demonstrated:  Atrial paced rhythm with left anterior fascicular block, poor R wave progression, lead motion artifact.  Recent Labs: 12/07/2018: BUN 34; Creatinine, Ser 0.87; Hemoglobin 10.4; Platelets 140; Potassium 3.8; Sodium 142    Wt Readings from Last 3 Encounters:  11/30/18 152 lb (68.9 kg)  06/23/18 200 lb (90.7 kg)  05/02/18 202 lb (91.6 kg)     Objective:    Vital Signs:  Ht 5\' 1"  (1.549 m)    BMI 28.72 kg/m    Patient spoke in short sentences, not obviously short of breath. No audible wheezing or coughing.  ASSESSMENT & PLAN:    1.  Paroxysmal atrial fibrillation.  CHA2DS2-VASc or is 6.  She remains on renally dosed  Xarelto and also Coreg.  Low rhythm burden based on device interrogation.  No reported bleeding problems.  I reviewed her most recent lab work.  Keep interval follow-up with PCP.  2.  Medtronic pacemaker in place.  She continues to follow with Dr. Ladona Ridgel.  3.  CAD status post DES to the LAD.  No active angina symptoms with fairly sedentary status.  Continue statin therapy and observation.   Time:   Today, I have spent 8 minutes with the patient with telehealth technology discussing the above problems.     Medication Adjustments/Labs and Tests Ordered: Current medicines are reviewed at length with the patient today.  Concerns regarding medicines are outlined above.   Tests Ordered: No orders of the defined types were placed in this encounter.   Medication Changes: No orders of the defined types were placed in this encounter.   Follow Up:  Virtual Visit  6 months.  Signed, Nona Dell, MD  05/31/2019 11:18 AM    [facility]

## 2019-05-31 ENCOUNTER — Telehealth (INDEPENDENT_AMBULATORY_CARE_PROVIDER_SITE_OTHER): Payer: Medicare PPO | Admitting: Cardiology

## 2019-05-31 ENCOUNTER — Encounter: Payer: Self-pay | Admitting: Cardiology

## 2019-05-31 VITALS — Ht 61.0 in

## 2019-05-31 DIAGNOSIS — Z950 Presence of cardiac pacemaker: Secondary | ICD-10-CM

## 2019-05-31 DIAGNOSIS — I2510 Atherosclerotic heart disease of native coronary artery without angina pectoris: Secondary | ICD-10-CM

## 2019-05-31 DIAGNOSIS — E782 Mixed hyperlipidemia: Secondary | ICD-10-CM

## 2019-05-31 DIAGNOSIS — I480 Paroxysmal atrial fibrillation: Secondary | ICD-10-CM

## 2019-05-31 NOTE — Patient Instructions (Addendum)
Medication Instructions:   Your physician recommends that you continue on your current medications as directed. Please refer to the Current Medication list given to you today.  Labwork:  NONE  Testing/Procedures:  NONE  Follow-Up:  Your physician recommends that you schedule a follow-up appointment in: 6 months (virtual). You will receive a reminder letter in the mail in about 4 months reminding you to call and schedule your appointment. If you don't receive this letter, please contact our office.  Any Other Special Instructions Will Be Listed Below (If Applicable).  If you need a refill on your cardiac medications before your next appointment, please call your pharmacy. 

## 2019-07-18 ENCOUNTER — Ambulatory Visit (INDEPENDENT_AMBULATORY_CARE_PROVIDER_SITE_OTHER): Payer: Medicare PPO | Admitting: Internal Medicine

## 2019-07-18 ENCOUNTER — Encounter: Payer: Self-pay | Admitting: Internal Medicine

## 2019-07-18 ENCOUNTER — Other Ambulatory Visit: Payer: Self-pay

## 2019-07-18 VITALS — BP 156/70 | HR 70 | Temp 97.6°F | Ht <= 58 in | Wt 185.0 lb

## 2019-07-18 DIAGNOSIS — Z950 Presence of cardiac pacemaker: Secondary | ICD-10-CM

## 2019-07-18 DIAGNOSIS — I480 Paroxysmal atrial fibrillation: Secondary | ICD-10-CM

## 2019-07-18 LAB — CUP PACEART INCLINIC DEVICE CHECK
Battery Impedance: 1394 Ohm
Battery Remaining Longevity: 43 mo
Battery Voltage: 2.77 V
Brady Statistic AP VP Percent: 1 %
Brady Statistic AP VS Percent: 95 %
Brady Statistic AS VP Percent: 0 %
Brady Statistic AS VS Percent: 5 %
Date Time Interrogation Session: 20210324170149
Implantable Lead Implant Date: 20130918
Implantable Lead Implant Date: 20130918
Implantable Lead Location: 753859
Implantable Lead Location: 753860
Implantable Lead Model: 5076
Implantable Lead Model: 5076
Implantable Pulse Generator Implant Date: 20130918
Lead Channel Impedance Value: 355 Ohm
Lead Channel Impedance Value: 596 Ohm
Lead Channel Pacing Threshold Amplitude: 0.75 V
Lead Channel Pacing Threshold Amplitude: 0.75 V
Lead Channel Pacing Threshold Pulse Width: 0.4 ms
Lead Channel Pacing Threshold Pulse Width: 0.4 ms
Lead Channel Sensing Intrinsic Amplitude: 2.8 mV
Lead Channel Sensing Intrinsic Amplitude: 8 mV
Lead Channel Setting Pacing Amplitude: 2 V
Lead Channel Setting Pacing Amplitude: 2.5 V
Lead Channel Setting Pacing Pulse Width: 0.4 ms
Lead Channel Setting Sensing Sensitivity: 4 mV

## 2019-07-18 NOTE — Progress Notes (Signed)
HPI Brianna Chandler returns today for followup. She is a very pleasant68 year old woman with morbid obesity, coronary artery disease status post stenting in 2010, with symptomatic bradycardia, status post permanent pacemaker insertion. In the interim, she has been stable. She denies any falls or bleeding. No palpitations. She has not been in the hospital. Appetite is good.  No Known Allergies   Current Outpatient Medications  Medication Sig Dispense Refill  . amLODipine (NORVASC) 5 MG tablet TAKE 1 TABLET BY MOUTH EVERY MORNING 30 tablet 6  . atorvastatin (LIPITOR) 40 MG tablet Take 1 tablet (40 mg total) by mouth at bedtime. 30 tablet 3  . carvedilol (COREG) 12.5 MG tablet TAKE 1/2 TABLET BY MOUTH TWICE DAILY 90 tablet 1  . diclofenac Sodium (VOLTAREN) 1 % GEL Apply 1 application topically 2 (two) times daily as needed.    . dorzolamide-timolol (COSOPT) 22.3-6.8 MG/ML ophthalmic solution Place 1 drop into the left eye 2 (two) times daily.    . furosemide (LASIX) 40 MG tablet Take 1 tablet (40 mg total) by mouth 2 (two) times daily. Take one extra tablet daily prn for excess fluid 60 tablet 1  . isosorbide mononitrate (IMDUR) 30 MG 24 hr tablet TAKE 1 TABLET BY MOUTH EVERY EVENING 90 tablet 3  . latanoprost (XALATAN) 0.005 % ophthalmic solution Place 1 drop into the left eye daily.    Marland Kitchen losartan (COZAAR) 50 MG tablet Take 1 tablet by mouth daily.    . metFORMIN (GLUCOPHAGE) 500 MG tablet Take 500 mg by mouth 2 (two) times daily with a meal.    . Multiple Vitamin (MULTIVITAMIN) tablet Take 1 tablet by mouth daily.      . nitroGLYCERIN (NITROSTAT) 0.4 MG SL tablet Place 0.4 mg under the tongue every 5 (five) minutes x 3 doses as needed for chest pain (MAX 3 TABLETS).     Marland Kitchen oxybutynin (DITROPAN-XL) 5 MG 24 hr tablet Take 5 mg by mouth daily.    Carlena Hurl 15 MG TABS tablet TAKE 1 TABLET BY MOUTH EVERY MORNING 30 tablet 6   No current facility-administered medications for this visit.      Past Medical History:  Diagnosis Date  . Chronic diastolic CHF (congestive heart failure) (HCC)    LVEF 60-65%, grade 2 diastolic dysfunction, PASP  . CKD (chronic kidney disease) stage 2, GFR 60-89 ml/min   . Coronary atherosclerosis of native coronary artery    NSTEMI s/p DES prox LAD October 2010  . Essential hypertension   . Obesity   . Osteoarthritis   . PAF (paroxysmal atrial fibrillation) (HCC)   . Symptomatic bradycardia    12/2011 s/p MDT Sensia DC PPM, [id number] H  . Syncope   . Type 2 diabetes mellitus (HCC)     ROS:   All systems reviewed and negative except as noted in the HPI.   Past Surgical History:  Procedure Laterality Date  . CATARACT EXTRACTION    . PACEMAKER INSERTION    . PERMANENT PACEMAKER INSERTION Left 01/12/2012   Procedure: PERMANENT PACEMAKER INSERTION;  Surgeon: Marinus Maw, MD;  Location: [facility] CATH LAB;  Service: Cardiovascular;  Laterality: Left;  . SLT LASER APPLICATION Left 11/13/2012   Procedure: SLT LASER APPLICATION;  Surgeon: Susa Simmonds, MD;  Location: [facility] ORS;  Service: Ophthalmology;  Laterality: Left;  . TOTAL ABDOMINAL HYSTERECTOMY       Family History  Problem Relation Age of Onset  . Diabetes Other   . Hypertension Other   .  Coronary artery disease Other      Social History   Socioeconomic History  . Marital status: Widowed    Spouse name: Not on file  . Number of children: Not on file  . Years of education: Not on file  . Highest education level: Not on file  Occupational History  . Occupation: Retired  Tobacco Use  . Smoking status: Never Smoker  . Smokeless tobacco: Former Systems developer    Types: Snuff  Substance and Sexual Activity  . Alcohol use: No    Alcohol/week: 0.0 standard drinks  . Drug use: No  . Sexual activity: Not on file  Other Topics Concern  . Not on file  Social History Narrative  . Not on file   Social Determinants of Health   Financial Resource Strain:   . Difficulty of Paying  Living Expenses:   Food Insecurity:   . Worried About Charity fundraiser in the Last Year:   . Arboriculturist in the Last Year:   Transportation Needs:   . Film/video editor (Medical):   Marland Kitchen Lack of Transportation (Non-Medical):   Physical Activity:   . Days of Exercise per Week:   . Minutes of Exercise per Session:   Stress:   . Feeling of Stress :   Social Connections:   . Frequency of Communication with Friends and Family:   . Frequency of Social Gatherings with Friends and Family:   . Attends Religious Services:   . Active Member of Clubs or Organizations:   . Attends Archivist Meetings:   Marland Kitchen Marital Status:   Intimate Partner Violence:   . Fear of Current or Ex-Partner:   . Emotionally Abused:   Marland Kitchen Physically Abused:   . Sexually Abused:      BP (!) 156/70   Pulse 70   Temp 97.6 F (36.4 C)   Ht 4\' 8"  (1.422 m)   Wt 185 lb (83.9 kg)   BMI 41.48 kg/m   Physical Exam:  Well appearing NAD HEENT: Unremarkable Neck:  No JVD, no thyromegally Lymphatics:  No adenopathy Back:  No CVA tenderness Lungs:  Clear with no wheezes HEART:  Regular rate rhythm, no murmurs, no rubs, no clicks Abd:  soft, positive bowel sounds, no organomegally, no rebound, no guarding Ext:  2 plus pulses, no edema, no cyanosis, no clubbing Skin:  No rashes no nodules Neuro:  CN II through XII intact, motor grossly intact  DEVICE  Normal device function.  See PaceArt for details.   Assess/Plan: 1. HTN - her bp is minimally elevated. She is encouraged to lose weight. 2. Sinus node dysfunction - she is pacing 95% of the time and is asymptomatic 3. PPM- her medtronic DDD PM has about 3.5 years of battery longevity. 4. Obesity - her bmi is 43! I have encouraged her to maintain a low fat diet and lose weight.  Mikle Bosworth.D.

## 2019-07-18 NOTE — Patient Instructions (Signed)
Medication Instructions:  Your physician recommends that you continue on your current medications as directed. Please refer to the Current Medication list given to you today.  *If you need a refill on your cardiac medications before your next appointment, please call your pharmacy*   Lab Work: NONE   If you have labs (blood work) drawn today and your tests are completely normal, you will receive your results only by: . MyChart Message (if you have MyChart) OR . A paper copy in the mail If you have any lab test that is abnormal or we need to change your treatment, we will call you to review the results.   Testing/Procedures: NONE    Follow-Up: At [facility], you and your health needs are our priority.  As part of our continuing mission to provide you with exceptional heart care, we have created designated Provider Care Teams.  These Care Teams include your primary Cardiologist (physician) and Advanced Practice Providers (APPs -  Physician Assistants and Nurse Practitioners) who all work together to provide you with the care you need, when you need it.  We recommend signing up for the patient portal called "MyChart".  Sign up information is provided on this After Visit Summary.  MyChart is used to connect with patients for Virtual Visits (Telemedicine).  Patients are able to view lab/test results, encounter notes, upcoming appointments, etc.  Non-urgent messages can be sent to your provider as well.   To learn more about what you can do with MyChart, go to [email or web address].    Your next appointment:   1 year(s)  The format for your next appointment:   In Person  Provider:   Gregg Taylor, MD   Other Instructions Thank you for choosing [facility]!    

## 2019-08-07 ENCOUNTER — Other Ambulatory Visit: Payer: Self-pay | Admitting: Cardiology

## 2019-10-17 ENCOUNTER — Ambulatory Visit (INDEPENDENT_AMBULATORY_CARE_PROVIDER_SITE_OTHER): Payer: Medicare PPO | Admitting: *Deleted

## 2019-10-17 DIAGNOSIS — I480 Paroxysmal atrial fibrillation: Secondary | ICD-10-CM | POA: Diagnosis not present

## 2019-10-17 LAB — CUP PACEART REMOTE DEVICE CHECK
Battery Impedance: 1534 Ohm
Battery Remaining Longevity: 40 mo
Battery Voltage: 2.76 V
Brady Statistic AP VP Percent: 0 %
Brady Statistic AP VS Percent: 96 %
Brady Statistic AS VP Percent: 0 %
Brady Statistic AS VS Percent: 3 %
Date Time Interrogation Session: 20210623140530
Implantable Lead Implant Date: 20130918
Implantable Lead Implant Date: 20130918
Implantable Lead Location: 753859
Implantable Lead Location: 753860
Implantable Lead Model: 5076
Implantable Lead Model: 5076
Implantable Pulse Generator Implant Date: 20130918
Lead Channel Impedance Value: 355 Ohm
Lead Channel Impedance Value: 519 Ohm
Lead Channel Pacing Threshold Amplitude: 0.75 V
Lead Channel Pacing Threshold Pulse Width: 0.4 ms
Lead Channel Setting Pacing Amplitude: 2 V
Lead Channel Setting Pacing Amplitude: 2.5 V
Lead Channel Setting Pacing Pulse Width: 0.4 ms
Lead Channel Setting Sensing Sensitivity: 4 mV

## 2019-10-18 NOTE — Progress Notes (Signed)
Remote pacemaker transmission.   

## 2019-11-29 ENCOUNTER — Encounter: Payer: Self-pay | Admitting: Cardiology

## 2019-11-29 ENCOUNTER — Telehealth: Payer: Self-pay | Admitting: *Deleted

## 2019-11-29 ENCOUNTER — Telehealth (INDEPENDENT_AMBULATORY_CARE_PROVIDER_SITE_OTHER): Payer: Medicare PPO | Admitting: Cardiology

## 2019-11-29 VITALS — HR 61 | Ht <= 58 in

## 2019-11-29 DIAGNOSIS — Z79899 Other long term (current) drug therapy: Secondary | ICD-10-CM

## 2019-11-29 DIAGNOSIS — Z950 Presence of cardiac pacemaker: Secondary | ICD-10-CM

## 2019-11-29 DIAGNOSIS — I25119 Atherosclerotic heart disease of native coronary artery with unspecified angina pectoris: Secondary | ICD-10-CM

## 2019-11-29 DIAGNOSIS — I480 Paroxysmal atrial fibrillation: Secondary | ICD-10-CM | POA: Diagnosis not present

## 2019-11-29 MED ORDER — FUROSEMIDE 40 MG PO TABS: 40.0000 mg | ORAL_TABLET | Freq: Every day | ORAL | 2 refills | Status: DC

## 2019-11-29 NOTE — Progress Notes (Signed)
Virtual Visit via Telephone Note   This visit type was conducted due to national recommendations for restrictions regarding the COVID-19 Pandemic (e.g. social distancing) in an effort to limit this patient's exposure and mitigate transmission in our community.  Due to her co-morbid illnesses, this patient is at least at moderate risk for complications without adequate follow up.  This format is felt to be most appropriate for this patient at this time.  The patient did not have access to video technology/had technical difficulties with video requiring transitioning to audio format only (telephone).  All issues noted in this document were discussed and addressed.  No physical exam could be performed with this format.  Please refer to the patient's chart for her  consent to telehealth for [facility].    Date:  11/29/2019   ID:  Brianna Chandler, DOB Dec 10, 1923, MRN [id number] The patient was identified using 2 identifiers.  Patient Location: Home Provider Location: Office/Clinic  PCP:  Suzan Slick, MD  Cardiologist:  Nona Dell, MD  Electrophysiologist:  None   Evaluation Performed:  Follow-Up Visit  Chief Complaint:  Cardiac follow-up  History of Present Illness:    Brianna Chandler is a 86 y.o. female last assessed via telehealth encounter in February. We spoke by phone today, I also spoke with her son who is her primary caregiver. She does not report any palpitations or chest pain. She has had trouble with leg swelling, but has not been on Lasix over the last few months. We discussed resuming Lasix at 40 mg daily with follow-up lab work.  She follows with Dr. Ladona Ridgel, Medtronic pacemaker in place.  She had a visit with him in March.  Device interrogation in June showed normal function, AF burden 4.5%.  She is due for follow-up lab work on [employer]. No reported bleeding problems.  I reviewed her medical regimen as detailed below.  Past Medical History:  Diagnosis Date  .  Chronic diastolic CHF (congestive heart failure) (HCC)    LVEF 60-65%, grade 2 diastolic dysfunction, PASP  . CKD (chronic kidney disease) stage 2, GFR 60-89 ml/min   . Coronary atherosclerosis of native coronary artery    NSTEMI s/p DES prox LAD October 2010  . Essential hypertension   . Obesity   . Osteoarthritis   . PAF (paroxysmal atrial fibrillation) (HCC)   . Symptomatic bradycardia    12/2011 s/p MDT Sensia DC PPM, [id number] H  . Syncope   . Type 2 diabetes mellitus (HCC)    Past Surgical History:  Procedure Laterality Date  . CATARACT EXTRACTION    . PACEMAKER INSERTION    . PERMANENT PACEMAKER INSERTION Left 01/12/2012   Procedure: PERMANENT PACEMAKER INSERTION;  Surgeon: Marinus Maw, MD;  Location: [facility] CATH LAB;  Service: Cardiovascular;  Laterality: Left;  . SLT LASER APPLICATION Left 11/13/2012   Procedure: SLT LASER APPLICATION;  Surgeon: Susa Simmonds, MD;  Location: [facility] ORS;  Service: Ophthalmology;  Laterality: Left;  . TOTAL ABDOMINAL HYSTERECTOMY       Current Meds  Medication Sig  . amLODipine (NORVASC) 5 MG tablet TAKE 1 TABLET BY MOUTH EVERY MORNING  . atorvastatin (LIPITOR) 40 MG tablet Take 1 tablet (40 mg total) by mouth at bedtime.  . carvedilol (COREG) 12.5 MG tablet TAKE 1/2 TABLET BY MOUTH TWICE DAILY  . diclofenac Sodium (VOLTAREN) 1 % GEL Apply 1 application topically 2 (two) times daily as needed.  . dorzolamide-timolol (COSOPT) 22.3-6.8 MG/ML ophthalmic solution Place 1 drop  into the left eye 2 (two) times daily.  . isosorbide mononitrate (IMDUR) 30 MG 24 hr tablet TAKE 1 TABLET BY MOUTH EVERY EVENING  . latanoprost (XALATAN) 0.005 % ophthalmic solution Place 1 drop into the left eye daily.  Marland Kitchen losartan (COZAAR) 50 MG tablet Take 1 tablet by mouth daily.  . metFORMIN (GLUCOPHAGE) 500 MG tablet Take 500 mg by mouth 2 (two) times daily with a meal.  . Multiple Vitamin (MULTIVITAMIN) tablet Take 1 tablet by mouth daily.    . nitroGLYCERIN  (NITROSTAT) 0.4 MG SL tablet Place 0.4 mg under the tongue every 5 (five) minutes x 3 doses as needed for chest pain (MAX 3 TABLETS).   Marland Kitchen oxybutynin (DITROPAN-XL) 5 MG 24 hr tablet Take 5 mg by mouth daily.  Carlena Hurl 15 MG TABS tablet TAKE 1 TABLET BY MOUTH EVERY MORNING  . [DISCONTINUED] furosemide (LASIX) 40 MG tablet Take 1 tablet (40 mg total) by mouth 2 (two) times daily. Take one extra tablet daily prn for excess fluid     Allergies:   Patient has no known allergies.   ROS:   No syncope.  Prior CV studies:   The following studies were reviewed today:  Echocardiogram 07/01/2015: Study Conclusions  - Left ventricle: The cavity size was normal. Wall thickness was increased in a pattern of moderate LVH. Systolic function was normal. The estimated ejection fraction was in the range of 60% to 65%. Wall motion was normal; there were no regional wall motion abnormalities. Features are consistent with a pseudonormal left ventricular filling pattern, with concomitant abnormal relaxation and increased filling pressure (grade 2 diastolic dysfunction). - Aortic valve: Mildly calcified annulus. Trileaflet; mildly thickened leaflets. Valve area (VTI): 1.68 cm^2. Valve area (Vmax): 1.56 cm^2. - Left atrium: The atrium was severely dilated. - Right atrium: The atrium was mildly dilated. - Atrial septum: No defect or patent foramen ovale was identified. - Pulmonary arteries: Systolic pressure was moderately increased. PA peak pressure: 44 mm Hg (S). - Technically adequate study.  Labs/Other Tests and Data Reviewed:    EKG:  An ECG dated 10/31/2017 was personally reviewed today and demonstrated:  Atrial paced rhythm with left anterior fascicular block, poor R wave progression, lead motion artifact.  Recent Labs: 12/07/2018: BUN 34; Creatinine, Ser 0.87; Hemoglobin 10.4; Platelets 140; Potassium 3.8; Sodium 142    Wt Readings from Last 3 Encounters:  07/18/19 185 lb  (83.9 kg)  11/30/18 152 lb (68.9 kg)  06/23/18 200 lb (90.7 kg)     Objective:    Vital Signs:  Pulse 61   Ht 4\' 8"  (1.422 m)   SpO2 97%   BMI 41.48 kg/m    Patient spoke in short sentences, not short of breath on the phone.  ASSESSMENT & PLAN:    1. Paroxysmal atrial fibrillation with CHA2DS2-VASc score of 6. She remains symptomatically stable with low rhythm burden based on device interrogation. Continue Xarelto and Coreg. Follow-up CBC and BMET being arranged.  2. Leg swelling, resuming Lasix 40 mg daily with follow-up lab work as noted above.  3. Medtronic pacemaker in place with follow-up by Dr. . Device function normal by remote interrogation in June.  4. CAD status post DES to the LAD without active angina.   Time:   Today, I have spent 6 minutes with the patient with telehealth technology discussing the above problems.     Medication Adjustments/Labs and Tests Ordered: Current medicines are reviewed at length with the patient today.  Concerns regarding  medicines are outlined above.   Tests Ordered: Orders Placed This Encounter  Procedures  . CBC  . Basic metabolic panel    Medication Changes: Meds ordered this encounter  Medications  . furosemide (LASIX) 40 MG tablet    Sig: Take 1 tablet (40 mg total) by mouth daily.    Dispense:  90 tablet    Refill:  2    Follow Up:  In Person 6 months.  Signed, Nona Dell, MD  11/29/2019 11:39 AM    [facility]

## 2019-11-29 NOTE — Telephone Encounter (Signed)
Per [facility], patient is already on lasix 40 mg BID prescribed by PCP. Advised to cancel previous rx. Medication profile updated.

## 2019-11-29 NOTE — Patient Instructions (Addendum)
Medication Instructions:    Your physician recommends that you continue on your current medications as directed. Please refer to the Current Medication list given to you today.  Restart furosemide 40 mg daily-sent to [facility]  Labwork:  Your physician recommends that you return for lab work in: 7-10 days to check your BMET & CBC. This may be done at [facility] from 8:00 am - 4:00 pm. No appointment is needed. You lab orders have been faxed to their lab.  Testing/Procedures:  NONE  Follow-Up:  Your physician recommends that you schedule a follow-up appointment in: 6 months.   Any Other Special Instructions Will Be Listed Below (If Applicable).  If you need a refill on your cardiac medications before your next appointment, please call your pharmacy.

## 2019-12-04 ENCOUNTER — Other Ambulatory Visit: Payer: Self-pay | Admitting: Cardiology

## 2019-12-14 ENCOUNTER — Telehealth: Payer: Self-pay | Admitting: *Deleted

## 2019-12-14 MED ORDER — FUROSEMIDE 40 MG PO TABS: 40.0000 mg | ORAL_TABLET | Freq: Every day | ORAL | 1 refills | Status: DC

## 2019-12-14 MED ORDER — FUROSEMIDE 40 MG PO TABS: 40.0000 mg | ORAL_TABLET | Freq: Every day | ORAL | 1 refills | Status: AC

## 2019-12-14 NOTE — Telephone Encounter (Signed)
-----   Message from Jonelle Sidle, MD sent at 12/14/2019  8:28 AM EDT ----- Results reviewed.  Creatinine 1.59 with normal potassium.  Reduce Lasix to 40 mg once daily.  Hemoglobin down to 8.7, previously around 10.  Ask about any potential interim bleeding on Xarelto.  She does have a chronic history of anemia however.

## 2019-12-14 NOTE — Telephone Encounter (Signed)
Son, Saaya Procell informed and verbalized understanding of plan Denies any interim bleeding and will monitor Copy sent to PCP

## 2020-01-16 ENCOUNTER — Ambulatory Visit (INDEPENDENT_AMBULATORY_CARE_PROVIDER_SITE_OTHER): Payer: Medicare PPO | Admitting: *Deleted

## 2020-01-16 DIAGNOSIS — Z950 Presence of cardiac pacemaker: Secondary | ICD-10-CM

## 2020-01-16 LAB — CUP PACEART REMOTE DEVICE CHECK
Battery Impedance: 1649 Ohm
Battery Remaining Longevity: 38 mo
Battery Voltage: 2.76 V
Brady Statistic AP VP Percent: 1 %
Brady Statistic AP VS Percent: 95 %
Brady Statistic AS VP Percent: 0 %
Brady Statistic AS VS Percent: 4 %
Date Time Interrogation Session: 20210922141232
Implantable Lead Implant Date: 20130918
Implantable Lead Implant Date: 20130918
Implantable Lead Location: 753859
Implantable Lead Location: 753860
Implantable Lead Model: 5076
Implantable Lead Model: 5076
Implantable Pulse Generator Implant Date: 20130918
Lead Channel Impedance Value: 360 Ohm
Lead Channel Impedance Value: 483 Ohm
Lead Channel Pacing Threshold Amplitude: 1 V
Lead Channel Pacing Threshold Pulse Width: 0.4 ms
Lead Channel Setting Pacing Amplitude: 2 V
Lead Channel Setting Pacing Amplitude: 2.5 V
Lead Channel Setting Pacing Pulse Width: 0.4 ms
Lead Channel Setting Sensing Sensitivity: 2.8 mV

## 2020-01-18 NOTE — Progress Notes (Signed)
Remote pacemaker transmission.   

## 2020-02-03 ENCOUNTER — Other Ambulatory Visit: Payer: Self-pay | Admitting: Cardiology

## 2020-04-16 ENCOUNTER — Ambulatory Visit (INDEPENDENT_AMBULATORY_CARE_PROVIDER_SITE_OTHER): Payer: Medicare PPO

## 2020-04-16 DIAGNOSIS — I480 Paroxysmal atrial fibrillation: Secondary | ICD-10-CM | POA: Diagnosis not present

## 2020-04-17 LAB — CUP PACEART REMOTE DEVICE CHECK
Battery Impedance: 1857 Ohm
Battery Remaining Longevity: 33 mo
Battery Voltage: 2.76 V
Brady Statistic AP VP Percent: 1 %
Brady Statistic AP VS Percent: 91 %
Brady Statistic AS VP Percent: 0 %
Brady Statistic AS VS Percent: 9 %
Date Time Interrogation Session: 20211222142859
Implantable Lead Implant Date: 20130918
Implantable Lead Implant Date: 20130918
Implantable Lead Location: 753859
Implantable Lead Location: 753860
Implantable Lead Model: 5076
Implantable Lead Model: 5076
Implantable Pulse Generator Implant Date: 20130918
Lead Channel Impedance Value: 361 Ohm
Lead Channel Impedance Value: 471 Ohm
Lead Channel Pacing Threshold Amplitude: 0.875 V
Lead Channel Pacing Threshold Pulse Width: 0.4 ms
Lead Channel Setting Pacing Amplitude: 2 V
Lead Channel Setting Pacing Amplitude: 2.5 V
Lead Channel Setting Pacing Pulse Width: 0.4 ms
Lead Channel Setting Sensing Sensitivity: 4 mV

## 2020-04-30 NOTE — Progress Notes (Signed)
Remote pacemaker transmission.   

## 2020-06-05 ENCOUNTER — Other Ambulatory Visit: Payer: Self-pay

## 2020-06-05 ENCOUNTER — Ambulatory Visit (INDEPENDENT_AMBULATORY_CARE_PROVIDER_SITE_OTHER): Payer: Medicare PPO | Admitting: Cardiology

## 2020-06-05 ENCOUNTER — Encounter: Payer: Self-pay | Admitting: Cardiology

## 2020-06-05 VITALS — BP 120/58 | HR 69 | Ht <= 58 in | Wt 180.0 lb

## 2020-06-05 DIAGNOSIS — I25119 Atherosclerotic heart disease of native coronary artery with unspecified angina pectoris: Secondary | ICD-10-CM

## 2020-06-05 DIAGNOSIS — Z950 Presence of cardiac pacemaker: Secondary | ICD-10-CM | POA: Diagnosis not present

## 2020-06-05 DIAGNOSIS — I480 Paroxysmal atrial fibrillation: Secondary | ICD-10-CM

## 2020-06-05 NOTE — Progress Notes (Signed)
Cardiology Office Note  Date: 06/05/2020   ID: Brianna Chandler, DOB 05/06/1923, MRN [id number]  PCP:  Suzan Slick, MD  Cardiologist:  Nona Dell, MD Electrophysiologist:  None   Chief Complaint  Patient presents with  . Cardiac follow-up    History of Present Illness: Brianna Chandler is a 87 y.o. female last assessed via telehealth encounter in August 2021.  She is here today with family member for a routine visit.  She does not report any sense of palpitations or chest pain.  Has been going to the wound clinic at [facility] for management of a right leg wound, just recently discharged from care.  She sees Dr. Ladona Ridgel, Medtronic pacemaker in place.  Device interrogation in December 2021 revealed normal function with intermittent episodes of atrial fibrillation.  I personally reviewed her ECG today which shows a ventricular paced rhythm.  I reviewed her lab work from November 2021.  I reviewed her medications which are outlined below and stable from a cardiac perspective.  No reported spontaneous bleeding problems on renally dosed Xarelto.  She is on iron supplements with chronic anemia, her hemoglobin was up to 9.5 from 8.7.  Past Medical History:  Diagnosis Date  . Chronic diastolic CHF (congestive heart failure) (HCC)    LVEF 60-65%, grade 2 diastolic dysfunction, PASP  . CKD (chronic kidney disease) stage 2, GFR 60-89 ml/min   . Coronary atherosclerosis of native coronary artery    NSTEMI s/p DES prox LAD October 2010  . Essential hypertension   . Obesity   . Osteoarthritis   . PAF (paroxysmal atrial fibrillation) (HCC)   . Symptomatic bradycardia    12/2011 s/p MDT Sensia DC PPM, [id number]  . Syncope   . Type 2 diabetes mellitus (HCC)     Past Surgical History:  Procedure Laterality Date  . CATARACT EXTRACTION    . PACEMAKER INSERTION    . PERMANENT PACEMAKER INSERTION Left 01/12/2012   Procedure: PERMANENT PACEMAKER INSERTION;  Surgeon: Marinus Maw, MD;  Location: [facility] CATH LAB;  Service: Cardiovascular;  Laterality: Left;  . SLT LASER APPLICATION Left 11/13/2012   Procedure: SLT LASER APPLICATION;  Surgeon: Susa Simmonds, MD;  Location: [facility] ORS;  Service: Ophthalmology;  Laterality: Left;  . TOTAL ABDOMINAL HYSTERECTOMY      Current Outpatient Medications  Medication Sig Dispense Refill  . amLODipine (NORVASC) 5 MG tablet TAKE 1 TABLET BY MOUTH EVERY MORNING 30 tablet 6  . atorvastatin (LIPITOR) 40 MG tablet Take 1 tablet (40 mg total) by mouth at bedtime. 30 tablet 3  . carvedilol (COREG) 12.5 MG tablet TAKE 1/2 TABLET BY MOUTH TWICE DAILY 90 tablet 1  . diclofenac Sodium (VOLTAREN) 1 % GEL Apply 1 application topically 2 (two) times daily as needed.    . dorzolamide-timolol (COSOPT) 22.3-6.8 MG/ML ophthalmic solution Place 1 drop into the left eye 2 (two) times daily.    . furosemide (LASIX) 40 MG tablet Take 1 tablet (40 mg total) by mouth daily. 90 tablet 1  . isosorbide mononitrate (IMDUR) 30 MG 24 hr tablet TAKE 1 TABLET BY MOUTH EVERY EVENING 90 tablet 3  . latanoprost (XALATAN) 0.005 % ophthalmic solution Place 1 drop into the left eye daily.    Marland Kitchen losartan (COZAAR) 50 MG tablet Take 1 tablet by mouth daily.    . metFORMIN (GLUCOPHAGE) 500 MG tablet Take 500 mg by mouth 2 (two) times daily with a meal.    . Multiple Vitamin (  MULTIVITAMIN) tablet Take 1 tablet by mouth daily.      . nitroGLYCERIN (NITROSTAT) 0.4 MG SL tablet Place 0.4 mg under the tongue every 5 (five) minutes x 3 doses as needed for chest pain (MAX 3 TABLETS).    Marland Kitchen oxybutynin (DITROPAN-XL) 5 MG 24 hr tablet Take 5 mg by mouth daily.    Carlena Hurl 15 MG TABS tablet TAKE 1 TABLET BY MOUTH EVERY MORNING 30 tablet 6   No current facility-administered medications for this visit.   Allergies:  Patient has no known allergies.   ROS: Hearing loss.  Physical Exam: VS:  BP (!) 120/58   Pulse 69   Ht 4\' 8"  (1.422 m)   Wt 180 lb (81.6 kg)   SpO2 92%   BMI  40.36 kg/m , BMI Body mass index is 40.36 kg/m.  Wt Readings from Last 3 Encounters:  06/05/20 180 lb (81.6 kg)  07/18/19 185 lb (83.9 kg)  11/30/18 152 lb (68.9 kg)    General: Elderly woman in wheelchair, wearing oxygen via nasal cannula. HEENT: Conjunctiva and lids normal, wearing a mask. Neck: Supple, no elevated JVP or carotid bruits, no thyromegaly. Lungs: Clear to auscultation, nonlabored breathing at rest. Cardiac: Regular rate and rhythm, no S3, soft systolic murmur. Extremities: Mild lower leg edema.  ECG:  An ECG dated 10/31/2017 was personally reviewed today and demonstrated:  Atrial paced rhythm with left anterior fascicular block, poor R wave progression, lead motion artifact.  Recent Labwork:  August 2021: Potassium 4.0, BUN 34, creatinine 1.59, hemoglobin 8.7, platelets 142 November 2021: Hemoglobin 9.5, platelets 148, BUN 40, creatinine 1.4, potassium 4.1  Other Studies Reviewed Today:  Echocardiogram 07/01/2015: Study Conclusions  - Left ventricle: The cavity size was normal. Wall thickness was increased in a pattern of moderate LVH. Systolic function was normal. The estimated ejection fraction was in the range of 60% to 65%. Wall motion was normal; there were no regional wall motion abnormalities. Features are consistent with a pseudonormal left ventricular filling pattern, with concomitant abnormal relaxation and increased filling pressure (grade 2 diastolic dysfunction). - Aortic valve: Mildly calcified annulus. Trileaflet; mildly thickened leaflets. Valve area (VTI): 1.68 cm^2. Valve area (Vmax): 1.56 cm^2. - Left atrium: The atrium was severely dilated. - Right atrium: The atrium was mildly dilated. - Atrial septum: No defect or patent foramen ovale was identified. - Pulmonary arteries: Systolic pressure was moderately increased. PA peak pressure: 44 mm Hg (S). - Technically adequate study.  Assessment and Plan:  1.  Paroxysmal  atrial fibrillation with CHA2DS2-VASc score of 6.  She remains on renally dosed Xarelto for stroke prophylaxis, also Coreg for general heart rate control.  No spontaneous bleeding problems.  I reviewed her lab work from November.  2.  CAD with history of DES to the LAD.  Continue medical therapy and conservative management at this point.  She is on Norvasc, Lipitor, Coreg, Imdur, and losartan.  As needed nitroglycerin is available.  3.  Medtronic pacemaker in place, followed by Dr. December with normal device function by last interrogation.  Medication Adjustments/Labs and Tests Ordered: Current medicines are reviewed at length with the patient today.  Concerns regarding medicines are outlined above.   Tests Ordered: Orders Placed This Encounter  Procedures  . EKG 12-Lead    Medication Changes: No orders of the defined types were placed in this encounter.   Disposition:  Follow up 6 months in the [place] office.  Signed, [place], MD, [facility] 06/05/2020 1:24 PM  [facility] [place], [place], [place] [address] Phone: [phone]; Fax: [phone]

## 2020-06-05 NOTE — Patient Instructions (Signed)

## 2020-07-02 ENCOUNTER — Other Ambulatory Visit: Payer: Self-pay | Admitting: Cardiology

## 2020-07-16 ENCOUNTER — Ambulatory Visit (INDEPENDENT_AMBULATORY_CARE_PROVIDER_SITE_OTHER): Payer: Medicare PPO

## 2020-07-16 DIAGNOSIS — I480 Paroxysmal atrial fibrillation: Secondary | ICD-10-CM | POA: Diagnosis not present

## 2020-07-16 LAB — CUP PACEART REMOTE DEVICE CHECK
Battery Impedance: 1915 Ohm
Battery Remaining Longevity: 32 mo
Battery Voltage: 2.76 V
Brady Statistic AP VP Percent: 2 %
Brady Statistic AP VS Percent: 89 %
Brady Statistic AS VP Percent: 0 %
Brady Statistic AS VS Percent: 8 %
Date Time Interrogation Session: 20220323130838
Implantable Lead Implant Date: 20130918
Implantable Lead Implant Date: 20130918
Implantable Lead Location: 753859
Implantable Lead Location: 753860
Implantable Lead Model: 5076
Implantable Lead Model: 5076
Implantable Pulse Generator Implant Date: 20130918
Lead Channel Impedance Value: 374 Ohm
Lead Channel Impedance Value: 546 Ohm
Lead Channel Pacing Threshold Amplitude: 0.875 V
Lead Channel Pacing Threshold Pulse Width: 0.4 ms
Lead Channel Setting Pacing Amplitude: 2 V
Lead Channel Setting Pacing Amplitude: 2.5 V
Lead Channel Setting Pacing Pulse Width: 0.4 ms
Lead Channel Setting Sensing Sensitivity: 2.8 mV

## 2020-07-22 ENCOUNTER — Other Ambulatory Visit: Payer: Self-pay

## 2020-07-22 ENCOUNTER — Encounter: Payer: Self-pay | Admitting: Internal Medicine

## 2020-07-22 ENCOUNTER — Ambulatory Visit (INDEPENDENT_AMBULATORY_CARE_PROVIDER_SITE_OTHER): Payer: Medicare PPO | Admitting: Internal Medicine

## 2020-07-22 VITALS — BP 124/60 | HR 61 | Ht <= 58 in | Wt 173.0 lb

## 2020-07-22 DIAGNOSIS — Z950 Presence of cardiac pacemaker: Secondary | ICD-10-CM

## 2020-07-22 DIAGNOSIS — R001 Bradycardia, unspecified: Secondary | ICD-10-CM

## 2020-07-22 DIAGNOSIS — I480 Paroxysmal atrial fibrillation: Secondary | ICD-10-CM | POA: Diagnosis not present

## 2020-07-22 LAB — CUP PACEART INCLINIC DEVICE CHECK
Battery Impedance: 1912 Ohm
Battery Remaining Longevity: 33 mo
Battery Voltage: 2.75 V
Brady Statistic AP VP Percent: 3 %
Brady Statistic AP VS Percent: 89 %
Brady Statistic AS VP Percent: 0 %
Brady Statistic AS VS Percent: 8 %
Date Time Interrogation Session: 20220329142036
Implantable Lead Implant Date: 20130918
Implantable Lead Implant Date: 20130918
Implantable Lead Location: 753859
Implantable Lead Location: 753860
Implantable Lead Model: 5076
Implantable Lead Model: 5076
Implantable Pulse Generator Implant Date: 20130918
Lead Channel Impedance Value: 379 Ohm
Lead Channel Impedance Value: 509 Ohm
Lead Channel Pacing Threshold Amplitude: 0.25 V
Lead Channel Pacing Threshold Amplitude: 0.75 V
Lead Channel Pacing Threshold Pulse Width: 0.4 ms
Lead Channel Pacing Threshold Pulse Width: 0.4 ms
Lead Channel Sensing Intrinsic Amplitude: 8 mV
Lead Channel Setting Pacing Amplitude: 2 V
Lead Channel Setting Pacing Amplitude: 2.5 V
Lead Channel Setting Pacing Pulse Width: 0.4 ms
Lead Channel Setting Sensing Sensitivity: 2.8 mV

## 2020-07-22 NOTE — Progress Notes (Signed)
HPI Brianna Chandler returns today for followup. She is a very pleasant19 year old woman with morbid obesity, coronary artery disease status post stenting in 2010, with symptomatic bradycardia, status post permanent pacemaker insertion. In the interim, she has been stable. She denies any falls or bleeding. No palpitations. She has not been in the hospital. Appetite is good.  No Known Allergies   Current Outpatient Medications  Medication Sig Dispense Refill  . amLODipine (NORVASC) 5 MG tablet TAKE 1 TABLET BY MOUTH EVERY MORNING 90 tablet 3  . atorvastatin (LIPITOR) 40 MG tablet Take 1 tablet (40 mg total) by mouth at bedtime. 30 tablet 3  . carvedilol (COREG) 12.5 MG tablet TAKE 1/2 TABLET BY MOUTH TWICE DAILY 90 tablet 1  . diclofenac Sodium (VOLTAREN) 1 % GEL Apply 1 application topically 2 (two) times daily as needed.    . dorzolamide-timolol (COSOPT) 22.3-6.8 MG/ML ophthalmic solution Place 1 drop into the left eye 2 (two) times daily.    . furosemide (LASIX) 40 MG tablet Take 1 tablet (40 mg total) by mouth daily. 90 tablet 1  . isosorbide mononitrate (IMDUR) 30 MG 24 hr tablet TAKE 1 TABLET BY MOUTH EVERY EVENING 90 tablet 3  . latanoprost (XALATAN) 0.005 % ophthalmic solution Place 1 drop into the left eye daily.    Marland Kitchen losartan (COZAAR) 50 MG tablet Take 1 tablet by mouth daily.    . metFORMIN (GLUCOPHAGE) 500 MG tablet Take 500 mg by mouth 2 (two) times daily with a meal.    . Multiple Vitamin (MULTIVITAMIN) tablet Take 1 tablet by mouth daily.      . nitroGLYCERIN (NITROSTAT) 0.4 MG SL tablet Place 0.4 mg under the tongue every 5 (five) minutes x 3 doses as needed for chest pain (MAX 3 TABLETS).    Marland Kitchen oxybutynin (DITROPAN-XL) 5 MG 24 hr tablet Take 5 mg by mouth daily.    . traMADol (ULTRAM) 50 MG tablet Take 50 mg by mouth every 6 (six) hours as needed.    Carlena Hurl 15 MG TABS tablet TAKE 1 TABLET BY MOUTH EVERY MORNING 30 tablet 6   No current facility-administered  medications for this visit.     Past Medical History:  Diagnosis Date  . Chronic diastolic CHF (congestive heart failure) (HCC)    LVEF 60-65%, grade 2 diastolic dysfunction, PASP  . CKD (chronic kidney disease) stage 2, GFR 60-89 ml/min   . Coronary atherosclerosis of native coronary artery    NSTEMI s/p DES prox LAD October 2010  . Essential hypertension   . Obesity   . Osteoarthritis   . PAF (paroxysmal atrial fibrillation) (HCC)   . Symptomatic bradycardia    12/2011 s/p MDT Sensia DC PPM, [id number] H  . Syncope   . Type 2 diabetes mellitus (HCC)     ROS:   All systems reviewed and negative except as noted in the HPI.   Past Surgical History:  Procedure Laterality Date  . CATARACT EXTRACTION    . PACEMAKER INSERTION    . PERMANENT PACEMAKER INSERTION Left 01/12/2012   Procedure: PERMANENT PACEMAKER INSERTION;  Surgeon: Marinus Maw, MD;  Location: [facility] CATH LAB;  Service: Cardiovascular;  Laterality: Left;  . SLT LASER APPLICATION Left 11/13/2012   Procedure: SLT LASER APPLICATION;  Surgeon: Susa Simmonds, MD;  Location: [facility] ORS;  Service: Ophthalmology;  Laterality: Left;  . TOTAL ABDOMINAL HYSTERECTOMY       Family History  Problem Relation Age of Onset  . Diabetes  Other   . Hypertension Other   . Coronary artery disease Other      Social History   Socioeconomic History  . Marital status: Widowed    Spouse name: Not on file  . Number of children: Not on file  . Years of education: Not on file  . Highest education level: Not on file  Occupational History  . Occupation: Retired  Tobacco Use  . Smoking status: Never Smoker  . Smokeless tobacco: Former Neurosurgeon    Types: Snuff  Vaping Use  . Vaping Use: Never used  Substance and Sexual Activity  . Alcohol use: No    Alcohol/week: 0.0 standard drinks  . Drug use: No  . Sexual activity: Not on file  Other Topics Concern  . Not on file  Social History Narrative  . Not on file   Social  Determinants of Health   Financial Resource Strain: Not on file  Food Insecurity: Not on file  Transportation Needs: Not on file  Physical Activity: Not on file  Stress: Not on file  Social Connections: Not on file  Intimate Partner Violence: Not on file     BP 124/60   Pulse 61   Ht 4\' 8"  (1.422 m)   Wt 173 lb (78.5 kg)   SpO2 100%   BMI 38.79 kg/m   Physical Exam:  Well appearing NAD HEENT: Unremarkable Neck:  No JVD, no thyromegally Lymphatics:  No adenopathy Back:  No CVA tenderness Lungs:  Clear with no wheezes HEART:  Regular rate rhythm, no murmurs, no rubs, no clicks Abd:  soft, positive bowel sounds, no organomegally, no rebound, no guarding Ext:  2 plus pulses, no edema, no cyanosis, no clubbing Skin:  No rashes no nodules Neuro:  CN II through XII intact, motor grossly intact  DEVICE  Normal device function.  See PaceArt for details.   Assess/Plan: 1. Sinus node dysfunction - she is asymptomatic, s/p PPM insertion 2. PPM - her medtronic DDD PM is working normally. Followup in one year 3. HTN - her bp is controlled. She is encouraged to lose weight.  Pascuala Klutts,MD

## 2020-07-22 NOTE — Patient Instructions (Signed)
Medication Instructions:  Your physician recommends that you continue on your current medications as directed. Please refer to the Current Medication list given to you today.  *If you need a refill on your cardiac medications before your next appointment, please call your pharmacy*   Lab Work: NONE   If you have labs (blood work) drawn today and your tests are completely normal, you will receive your results only by: . MyChart Message (if you have MyChart) OR . A paper copy in the mail If you have any lab test that is abnormal or we need to change your treatment, we will call you to review the results.   Testing/Procedures: NONE    Follow-Up: At [facility], you and your health needs are our priority.  As part of our continuing mission to provide you with exceptional heart care, we have created designated Provider Care Teams.  These Care Teams include your primary Cardiologist (physician) and Advanced Practice Providers (APPs -  Physician Assistants and Nurse Practitioners) who all work together to provide you with the care you need, when you need it.  We recommend signing up for the patient portal called "MyChart".  Sign up information is provided on this After Visit Summary.  MyChart is used to connect with patients for Virtual Visits (Telemedicine).  Patients are able to view lab/test results, encounter notes, upcoming appointments, etc.  Non-urgent messages can be sent to your provider as well.   To learn more about what you can do with MyChart, go to [email or web address].    Your next appointment:   1 year(s)  The format for your next appointment:   In Person  Provider:   Gregg Taylor, MD   Other Instructions Thank you for choosing [facility]!    

## 2020-07-24 NOTE — Progress Notes (Signed)
Remote pacemaker transmission.   

## 2020-07-28 ENCOUNTER — Other Ambulatory Visit: Payer: Self-pay | Admitting: Cardiology

## 2020-10-15 ENCOUNTER — Ambulatory Visit (INDEPENDENT_AMBULATORY_CARE_PROVIDER_SITE_OTHER): Payer: Medicare PPO

## 2020-10-15 DIAGNOSIS — I2510 Atherosclerotic heart disease of native coronary artery without angina pectoris: Secondary | ICD-10-CM

## 2020-10-16 LAB — CUP PACEART REMOTE DEVICE CHECK
Battery Impedance: 2097 Ohm
Battery Remaining Longevity: 29 mo
Battery Voltage: 2.75 V
Brady Statistic AP VP Percent: 2 %
Brady Statistic AP VS Percent: 93 %
Brady Statistic AS VP Percent: 0 %
Brady Statistic AS VS Percent: 4 %
Date Time Interrogation Session: 20220622132811
Implantable Lead Implant Date: 20130918
Implantable Lead Implant Date: 20130918
Implantable Lead Location: 753859
Implantable Lead Location: 753860
Implantable Lead Model: 5076
Implantable Lead Model: 5076
Implantable Pulse Generator Implant Date: 20130918
Lead Channel Impedance Value: 378 Ohm
Lead Channel Impedance Value: 568 Ohm
Lead Channel Pacing Threshold Amplitude: 0.75 V
Lead Channel Pacing Threshold Pulse Width: 0.4 ms
Lead Channel Setting Pacing Amplitude: 2 V
Lead Channel Setting Pacing Amplitude: 2.5 V
Lead Channel Setting Pacing Pulse Width: 0.4 ms
Lead Channel Setting Sensing Sensitivity: 4 mV

## 2020-11-04 NOTE — Progress Notes (Signed)
Remote pacemaker transmission.   

## 2020-12-15 ENCOUNTER — Ambulatory Visit: Payer: Medicare PPO | Admitting: Cardiology

## 2020-12-30 NOTE — Progress Notes (Signed)
Cardiology Office Note  Date: 12/31/2020   ID: DARE Brianna Chandler, DOB 06/17/1923, MRN [id number]  PCP:  Suzan Slick, MD  Cardiologist:  Nona Dell, MD Electrophysiologist:  None   Chief Complaint: 45-month follow-up  History of Present Illness: Brianna Chandler is a 87 y.o. female with a history of PAF, chronic diastolic heart failure, CAD, HTN, obesity, symptomatic bradycardia, syncope, DM2  She was previously seen by Dr. Diona Chandler on 06/05/2020 for routine visit.  She did not report any palpitations or chest pain.  She had been going to the wound clinic at [facility] for management of right leg wound.  She was recently discharged from care there.  She was seeing Dr. Ladona Chandler for Medtronic pacemaker management.  Device interrogation December 2021 with normal function and intermittent episodes of atrial fibrillation.  EKG on that visit showed ventricular paced rhythm.  She had no reported bleeding problems on renally dosed Xarelto.  She was on iron supplements for chronic anemia.  Hemoglobin was 9.5 up from 8.7.  Her CHA2DS2-VASc score was 6.  She was on renally dosed Xarelto for stroke prophylaxis.  She was continuing Coreg for heart rate control.  She had no bleeding on Xarelto.  She was continuing conservative CAD therapy on Norvasc, Lipitor, Coreg, Imdur, losartan as well as sublingual nitroglycerin.  She was following Dr. Ladona Chandler with normal device function by last interrogation.  She saw Dr. Ladona Chandler on 07/22/2020.  She was asymptomatic status post pacemaker insertion for sinus node dysfunction.  Her Medtronic DDD pacemaker was working normally.  Plans were to follow-up in 1 year.  Her blood pressure was under control.  She was encouraged to lose weight.  She is here for 74-month follow-up today.  She denies any recent acute illnesses or hospitalizations.  Her son who is with her states she has not been as active as usual and has more issues with mobilization.  She basically sits or  mobilizes with a wheelchair.  She is currently on O2 via nasal cannula.  She denies any anginal or exertional symptoms, palpitations or arrhythmias.  No PND or orthopnea.  No CVA or TIA-like symptoms.  No DOE or SOB.  Denies any bleeding.  Son states she is on iron supplementation and is hard to tell whether her stools are dark from bleeding for from being on iron.  She has a follow-up with her primary tomorrow per her son statement.  Her current cardiac regimen includes amlodipine 5 mg daily, atorvastatin 40 mg daily.  Carvedilol 12.5 mg p.o. twice daily, Lasix 40 mg daily, Imdur 30 mg daily, losartan 50 mg daily, sublingual nitroglycerin as needed, Xarelto 15 mg daily.  She is sitting in a wheelchair today.  He does have some chronic lower extremity edema.  Her son states she has been losing weight at the request of Dr. Ladona Chandler.  Today she weighs 161 pounds versus 173 and March of this year.  Past Medical History:  Diagnosis Date   Chronic diastolic CHF (congestive heart failure) (HCC)    LVEF 60-65%, grade 2 diastolic dysfunction, PASP   CKD (chronic kidney disease) stage 2, GFR 60-89 ml/min    Coronary atherosclerosis of native coronary artery    NSTEMI s/p DES prox LAD October 2010   Essential hypertension    Obesity    Osteoarthritis    PAF (paroxysmal atrial fibrillation) (HCC)    Symptomatic bradycardia    12/2011 s/p MDT Jana Half DC PPM, [id number]   Syncope  Type 2 diabetes mellitus (HCC)     Past Surgical History:  Procedure Laterality Date   CATARACT EXTRACTION     PACEMAKER INSERTION     PERMANENT PACEMAKER INSERTION Left 01/12/2012   Procedure: PERMANENT PACEMAKER INSERTION;  Surgeon: Marinus Maw, MD;  Location: [facility] CATH LAB;  Service: Cardiovascular;  Laterality: Left;   SLT LASER APPLICATION Left 11/13/2012   Procedure: SLT LASER APPLICATION;  Surgeon: Susa Simmonds, MD;  Location: [facility] ORS;  Service: Ophthalmology;  Laterality: Left;   TOTAL ABDOMINAL HYSTERECTOMY       Current Outpatient Medications  Medication Sig Dispense Refill   acetaminophen (TYLENOL 8 HOUR) 650 MG CR tablet Take 1,300 mg by mouth 2 (two) times daily.     amLODipine (NORVASC) 5 MG tablet TAKE 1 TABLET BY MOUTH EVERY MORNING 90 tablet 3   atorvastatin (LIPITOR) 40 MG tablet Take 1 tablet (40 mg total) by mouth at bedtime. 30 tablet 3   carvedilol (COREG) 12.5 MG tablet TAKE 1/2 TABLET BY MOUTH TWICE DAILY 90 tablet 3   diclofenac Sodium (VOLTAREN) 1 % GEL Apply 1 application topically 2 (two) times daily as needed.     dorzolamide-timolol (COSOPT) 22.3-6.8 MG/ML ophthalmic solution Place 1 drop into the left eye 2 (two) times daily.     furosemide (LASIX) 40 MG tablet Take 1 tablet (40 mg total) by mouth daily. 90 tablet 1   isosorbide mononitrate (IMDUR) 30 MG 24 hr tablet TAKE 1 TABLET BY MOUTH EVERY EVENING 90 tablet 3   latanoprost (XALATAN) 0.005 % ophthalmic solution Place 1 drop into the left eye daily.     losartan (COZAAR) 50 MG tablet Take 1 tablet by mouth daily.     metFORMIN (GLUCOPHAGE) 500 MG tablet Take 500 mg by mouth 2 (two) times daily with a meal.     Multiple Vitamin (MULTIVITAMIN) tablet Take 1 tablet by mouth daily.       nitroGLYCERIN (NITROSTAT) 0.4 MG SL tablet Place 0.4 mg under the tongue every 5 (five) minutes x 3 doses as needed for chest pain (MAX 3 TABLETS).     oxybutynin (DITROPAN-XL) 5 MG 24 hr tablet Take 5 mg by mouth daily.     traMADol (ULTRAM) 50 MG tablet Take 50 mg by mouth every 6 (six) hours as needed.     XARELTO 15 MG TABS tablet TAKE 1 TABLET BY MOUTH EVERY MORNING 30 tablet 6   No current facility-administered medications for this visit.   Allergies:  Patient has no known allergies.   Social History: The patient  reports that she has never smoked. She quit smokeless tobacco use about 30 years ago.  Her smokeless tobacco use included snuff. She reports that she does not drink alcohol and does not use drugs.   Family History: The  patient's family history includes Coronary artery disease in an other family member; Diabetes in an other family member; Hypertension in an other family member.   ROS:  Please see the history of present illness. Otherwise, complete review of systems is positive for none.  All other systems are reviewed and negative.   Physical Exam: VS:  BP 132/60   Pulse 62   Ht 4\' 8"  (1.422 m)   Wt 161 lb 12.8 oz (73.4 kg)   SpO2 99% Comment: 2 L/min 24/7  BMI 36.27 kg/m , BMI Body mass index is 36.27 kg/m.  Wt Readings from Last 3 Encounters:  12/31/20 161 lb 12.8 oz (73.4 kg)  07/22/20 173  lb (78.5 kg)  06/05/20 180 lb (81.6 kg)    General: Patient appears comfortable at rest. Neck: Supple, no elevated JVP or carotid bruits, no thyromegaly. Lungs: Clear to auscultation, nonlabored breathing at rest. Cardiac: Regular rate and rhythm, no S3 or significant systolic murmur, no pericardial rub. Extremities: Chronic nonpitting edema, distal pulses 2+. Skin: Warm and dry. Musculoskeletal: No kyphosis. Neuropsychiatric: Alert and oriented x3, affect grossly appropriate.  ECG:  EKG June 05, 2020 ventricular pacing rate of 69.  Recent Labwork: No results found for requested labs within last 8760 hours.  No results found for: CHOL, TRIG, HDL, CHOLHDL, VLDL, LDLCALC, LDLDIRECT  Other Studies Reviewed Today:  Echocardiogram 07/01/2015: Study Conclusions   - Left ventricle: The cavity size was normal. Wall thickness was   increased in a pattern of moderate LVH. Systolic function was   normal. The estimated ejection fraction was in the range of 60%   to 65%. Wall motion was normal; there were no regional wall   motion abnormalities. Features are consistent with a pseudonormal   left ventricular filling pattern, with concomitant abnormal   relaxation and increased filling pressure (grade 2 diastolic   dysfunction). - Aortic valve: Mildly calcified annulus. Trileaflet; mildly   thickened  leaflets. Valve area (VTI): 1.68 cm^2. Valve area   (Vmax): 1.56 cm^2. - Left atrium: The atrium was severely dilated. - Right atrium: The atrium was mildly dilated. - Atrial septum: No defect or patent foramen ovale was identified. - Pulmonary arteries: Systolic pressure was moderately increased.   PA peak pressure: 44 mm Hg (S). - Technically adequate study.   Assessment and Plan:  1. Paroxysmal atrial fibrillation (HCC)   2. CAD in native artery   3. Pacemaker   4. Essential hypertension, benign    1. Paroxysmal atrial fibrillation (HCC) Rate is controlled today at 62.  No bleeding on Xarelto.  Continue carvedilol 12.5 mg 1/2 pill p.o. twice daily.  Continue Xarelto 15 mg p.o. daily.  2. CAD in native artery No anginal or exertional symptoms today.  Continue atorvastatin 40 mg daily, sublingual nitroglycerin as needed.  3. Pacemaker Sees Dr. Ladona Chandler for device management.  Last remote device check 10/16/2020 histograms are appropriate, leads and battery stable for patient.  Follow-up as outlined.  AF burden was 2.4%, V rate controlled per histogram, history of AF on prescribed carvedilol and Xarelto.  4.  Essential hypertension Blood pressure well controlled today at 132/60.  Continue amlodipine 5 mg daily, carvedilol 12.5 mg 1/2 tablet p.o. twice daily, furosemide 40 mg daily, losartan 50 mg daily.  Medication Adjustments/Labs and Tests Ordered: Current medicines are reviewed at length with the patient today.  Concerns regarding medicines are outlined above.   Disposition: Follow-up with Dr. Diona Chandler or APP 6 months  Signed, Rennis Harding, NP 12/31/2020 4:09 PM    [facility] at [facility] [address] [place], [place], [place] [address] Phone: [phone]; Fax: [phone]

## 2020-12-31 ENCOUNTER — Encounter: Payer: Self-pay | Admitting: Family Medicine

## 2020-12-31 ENCOUNTER — Ambulatory Visit: Payer: Medicare PPO | Admitting: Family Medicine

## 2020-12-31 VITALS — BP 132/60 | HR 62 | Ht <= 58 in | Wt 161.8 lb

## 2020-12-31 DIAGNOSIS — I10 Essential (primary) hypertension: Secondary | ICD-10-CM | POA: Diagnosis not present

## 2020-12-31 DIAGNOSIS — Z950 Presence of cardiac pacemaker: Secondary | ICD-10-CM

## 2020-12-31 DIAGNOSIS — I480 Paroxysmal atrial fibrillation: Secondary | ICD-10-CM | POA: Diagnosis not present

## 2020-12-31 DIAGNOSIS — I2510 Atherosclerotic heart disease of native coronary artery without angina pectoris: Secondary | ICD-10-CM | POA: Diagnosis not present

## 2020-12-31 NOTE — Patient Instructions (Signed)
Medication Instructions:  Continue all current medications.  Labwork: none  Testing/Procedures: none  Follow-Up: 1 year   Any Other Special Instructions Will Be Listed Below (If Applicable).  If you need a refill on your cardiac medications before your next appointment, please call your pharmacy.  

## 2021-01-14 ENCOUNTER — Ambulatory Visit (INDEPENDENT_AMBULATORY_CARE_PROVIDER_SITE_OTHER): Payer: Medicare PPO

## 2021-01-14 DIAGNOSIS — I509 Heart failure, unspecified: Secondary | ICD-10-CM

## 2021-01-15 LAB — CUP PACEART REMOTE DEVICE CHECK
Battery Impedance: 2171 Ohm
Battery Remaining Longevity: 28 mo
Battery Voltage: 2.75 V
Brady Statistic AP VP Percent: 1 %
Brady Statistic AP VS Percent: 89 %
Brady Statistic AS VP Percent: 0 %
Brady Statistic AS VS Percent: 10 %
Date Time Interrogation Session: 20220921141530
Implantable Lead Implant Date: 20130918
Implantable Lead Implant Date: 20130918
Implantable Lead Location: 753859
Implantable Lead Location: 753860
Implantable Lead Model: 5076
Implantable Lead Model: 5076
Implantable Pulse Generator Implant Date: 20130918
Lead Channel Impedance Value: 378 Ohm
Lead Channel Impedance Value: 565 Ohm
Lead Channel Pacing Threshold Amplitude: 0.75 V
Lead Channel Pacing Threshold Pulse Width: 0.4 ms
Lead Channel Setting Pacing Amplitude: 2 V
Lead Channel Setting Pacing Amplitude: 2.5 V
Lead Channel Setting Pacing Pulse Width: 0.4 ms
Lead Channel Setting Sensing Sensitivity: 4 mV

## 2021-01-21 NOTE — Progress Notes (Signed)
Remote pacemaker transmission.   

## 2021-01-27 ENCOUNTER — Other Ambulatory Visit: Payer: Self-pay | Admitting: Cardiology

## 2021-01-27 NOTE — Telephone Encounter (Signed)
Prescription refill request for Xarelto received.  Indication: PAF Last office visit: 12/31/20  Verita Schneiders FNP Weight: 73.4kg Age: 87 Scr: 1.57 on 07/01/20 CrCl: 25.39  Based on above findings Xarelto 15mg  daily (renal) is the appropriate dose.  Refill approved.

## 2021-02-13 ENCOUNTER — Telehealth: Payer: Self-pay | Admitting: *Deleted

## 2021-02-13 NOTE — Telephone Encounter (Signed)
Received call from Margaretha Sheffield, NP with [facility].  She was out to see her today earlier today.  Vitals - 100/62  62  95% on 2.5 L/min 24/7.  She wants your opinion on putting her on [facility].  States that patient had a light exacerbation recently with rales.  Having to sleep with head of bed elevated, some SOB at rest & peripheral edema 2-3+.  She had the son increase her Lasix to 60mg  daily x 3 days, then back to her previous 40mg  daily.  Sounded some better today.  States she has her listed as a Stage IV Heart Failure patient.  Has to use her wheelchair in house.  Looking really frail & son does not want to take her back to the hospital anymore if he can help it.  She is listed as a DNR.

## 2021-02-16 NOTE — Telephone Encounter (Signed)
Brianna Sheffield, NP notified - this note faxed to:  [phone]

## 2021-04-26 DEATH — deceased
